# Patient Record
Sex: Female | Born: 1950
Health system: Southern US, Community
[De-identification: ages and names within clinical notes are randomized; demographics above are authoritative.]

## PROBLEM LIST (undated history)

## (undated) DIAGNOSIS — M1990 Unspecified osteoarthritis, unspecified site: Secondary | ICD-10-CM

## (undated) DIAGNOSIS — M25559 Pain in unspecified hip: Secondary | ICD-10-CM

## (undated) DIAGNOSIS — M5136 Other intervertebral disc degeneration, lumbar region: Secondary | ICD-10-CM

## (undated) DIAGNOSIS — E559 Vitamin D deficiency, unspecified: Secondary | ICD-10-CM

## (undated) DIAGNOSIS — R21 Rash and other nonspecific skin eruption: Secondary | ICD-10-CM

## (undated) DIAGNOSIS — M2550 Pain in unspecified joint: Secondary | ICD-10-CM

## (undated) DIAGNOSIS — R102 Pelvic and perineal pain: Secondary | ICD-10-CM

## (undated) DIAGNOSIS — K219 Gastro-esophageal reflux disease without esophagitis: Secondary | ICD-10-CM

## (undated) DIAGNOSIS — M51369 Other intervertebral disc degeneration, lumbar region without mention of lumbar back pain or lower extremity pain: Secondary | ICD-10-CM

## (undated) DIAGNOSIS — M810 Age-related osteoporosis without current pathological fracture: Secondary | ICD-10-CM

## (undated) DIAGNOSIS — R12 Heartburn: Secondary | ICD-10-CM

## (undated) DIAGNOSIS — N809 Endometriosis, unspecified: Secondary | ICD-10-CM

## (undated) DIAGNOSIS — R1310 Dysphagia, unspecified: Secondary | ICD-10-CM

## (undated) HISTORY — DX: Vitamin D deficiency, unspecified: E55.9

## (undated) HISTORY — DX: Pelvic and perineal pain: R10.2

## (undated) HISTORY — DX: Age-related osteoporosis without current pathological fracture: M81.0

## (undated) HISTORY — PX: SALPINGOOPHORECTOMY: SHX82

## (undated) HISTORY — DX: Unspecified osteoarthritis, unspecified site: M19.90

## (undated) HISTORY — DX: Pain in unspecified hip: M25.559

## (undated) HISTORY — DX: Endometriosis, unspecified: N80.9

---

## 1990-09-16 HISTORY — PX: ABDOMINAL HYSTERECTOMY: SHX81

## 2004-09-16 HISTORY — PX: COLONOSCOPY: SHX174

## 2004-09-24 ENCOUNTER — Ambulatory Visit: Payer: Self-pay | Admitting: Internal Medicine

## 2011-09-30 ENCOUNTER — Ambulatory Visit: Payer: Self-pay | Admitting: Internal Medicine

## 2012-09-30 ENCOUNTER — Ambulatory Visit: Payer: Self-pay | Admitting: Internal Medicine

## 2013-10-14 ENCOUNTER — Ambulatory Visit: Payer: Self-pay | Admitting: Internal Medicine

## 2013-12-06 ENCOUNTER — Ambulatory Visit: Payer: Self-pay | Admitting: Internal Medicine

## 2014-12-05 LAB — LIPID PANEL
Cholesterol: 192 mg/dL (ref 0–200)
HDL: 66 mg/dL (ref 35–70)
LDL Cholesterol: 110 mg/dL
Triglycerides: 80 mg/dL (ref 40–160)

## 2014-12-05 LAB — TSH: TSH: 1.89 u[IU]/mL (ref <=5.90)

## 2014-12-05 LAB — BASIC METABOLIC PANEL
BUN: 12 mg/dL (ref 4–21)
Creatinine: 0.7 mg/dL (ref <=1.1)

## 2014-12-05 LAB — CBC AND DIFFERENTIAL: Hemoglobin: 14.4 g/dL (ref 12.0–16.0)

## 2015-01-03 ENCOUNTER — Ambulatory Visit
Admit: 2015-01-03 | Disposition: A | Discharge status: Home / Self Care | Payer: Self-pay | Attending: Internal Medicine | Admitting: Internal Medicine

## 2015-01-03 LAB — HM DEXA SCAN

## 2015-01-03 LAB — HM MAMMOGRAPHY: HM Mammogram: NORMAL

## 2015-01-20 NOTE — Discharge Instructions (Signed)

## 2015-01-23 ENCOUNTER — Ambulatory Visit
Admission: RE | Admit: 2015-01-23 | Discharge: 2015-01-23 | Disposition: A | Discharge status: Home / Self Care | Payer: BLUE CROSS/BLUE SHIELD | Source: Ambulatory Visit | Attending: Gastroenterology | Admitting: Gastroenterology

## 2015-01-23 ENCOUNTER — Ambulatory Visit: Payer: BLUE CROSS/BLUE SHIELD | Admitting: Anesthesiology

## 2015-01-23 ENCOUNTER — Encounter
Admission: RE | Disposition: A | Discharge status: Home / Self Care | Payer: Self-pay | Source: Ambulatory Visit | Attending: Gastroenterology

## 2015-01-23 DIAGNOSIS — Z1211 Encounter for screening for malignant neoplasm of colon: Secondary | ICD-10-CM | POA: Diagnosis present

## 2015-01-23 DIAGNOSIS — K5730 Diverticulosis of large intestine without perforation or abscess without bleeding: Secondary | ICD-10-CM | POA: Diagnosis not present

## 2015-01-23 DIAGNOSIS — K648 Other hemorrhoids: Secondary | ICD-10-CM | POA: Insufficient documentation

## 2015-01-23 HISTORY — DX: Unspecified osteoarthritis, unspecified site: M19.90

## 2015-01-23 HISTORY — DX: Heartburn: R12

## 2015-01-23 HISTORY — DX: Dysphagia, unspecified: R13.10

## 2015-01-23 HISTORY — PX: COLONOSCOPY: SHX5424

## 2015-01-23 HISTORY — DX: Rash and other nonspecific skin eruption: R21

## 2015-01-23 HISTORY — DX: Pain in unspecified joint: M25.50

## 2015-01-23 LAB — HM COLONOSCOPY

## 2015-01-23 SURGERY — COLONOSCOPY
Anesthesia: Monitor Anesthesia Care | Wound class: Contaminated

## 2015-01-23 MED ORDER — DEXAMETHASONE SODIUM PHOSPHATE 4 MG/ML IJ SOLN
8.0000 mg | Freq: Once | INTRAMUSCULAR | Status: DC | PRN
Start: 2015-01-23 — End: 2015-01-23

## 2015-01-23 MED ORDER — ACETAMINOPHEN 160 MG/5ML PO SOLN: 325.0000 mg | ORAL | Status: DC | PRN

## 2015-01-23 MED ORDER — STERILE WATER FOR IRRIGATION IR SOLN
Status: DC | PRN
  Administered 2015-01-23: 08:00:00

## 2015-01-23 MED ORDER — ACETAMINOPHEN 325 MG PO TABS: 325.0000 mg | ORAL_TABLET | ORAL | Status: DC | PRN

## 2015-01-23 MED ORDER — LIDOCAINE HCL (CARDIAC) 20 MG/ML IV SOLN
INTRAVENOUS | Status: DC | PRN
  Administered 2015-01-23: 30 mg via INTRAVENOUS

## 2015-01-23 MED ORDER — OXYCODONE HCL 5 MG PO TABS: 5.0000 mg | ORAL_TABLET | Freq: Once | ORAL | Status: DC | PRN

## 2015-01-23 MED ORDER — LACTATED RINGERS IV SOLN
INTRAVENOUS | Status: DC | PRN
  Administered 2015-01-23: 08:00:00 via INTRAVENOUS

## 2015-01-23 MED ORDER — FENTANYL CITRATE (PF) 100 MCG/2ML IJ SOLN: 25.0000 ug | INTRAMUSCULAR | Status: DC | PRN

## 2015-01-23 MED ORDER — DEXAMETHASONE SODIUM PHOSPHATE 4 MG/ML IJ SOLN: 8.0000 mg | Freq: Once | INTRAMUSCULAR | Status: DC | PRN

## 2015-01-23 MED ORDER — LACTATED RINGERS IV SOLN: INTRAVENOUS | Status: DC

## 2015-01-23 MED ORDER — OXYCODONE HCL 5 MG/5ML PO SOLN: 5.0000 mg | Freq: Once | ORAL | Status: DC | PRN

## 2015-01-23 MED ORDER — SODIUM CHLORIDE 0.9 % IV SOLN: INTRAVENOUS | Status: DC

## 2015-01-23 MED ORDER — PROPOFOL 10 MG/ML IV BOLUS
INTRAVENOUS | Status: DC | PRN
  Administered 2015-01-23: 20 mg via INTRAVENOUS
  Administered 2015-01-23: 60 mg via INTRAVENOUS
  Administered 2015-01-23: 20 mg via INTRAVENOUS
  Administered 2015-01-23: 80 mg via INTRAVENOUS
  Administered 2015-01-23: 20 mg via INTRAVENOUS

## 2015-01-23 SURGICAL SUPPLY — 27 items
CANISTER SUCT 1200ML W/VALVE (MISCELLANEOUS) ×3 IMPLANT
FCP ESCP3.2XJMB 240X2.8X (MISCELLANEOUS)
FORCEPS BIOP RAD 4 LRG CAP 4 (CUTTING FORCEPS) IMPLANT
FORCEPS BIOP RJ4 240 W/NDL (MISCELLANEOUS)
FORCEPS ESCP3.2XJMB 240X2.8X (MISCELLANEOUS) IMPLANT
GOWN CVR UNV OPN BCK APRN NK (MISCELLANEOUS) ×2 IMPLANT
GOWN ISOL THUMB LOOP REG UNIV (MISCELLANEOUS) ×4
HEMOCLIP INSTINCT (CLIP) IMPLANT
INJECTOR VARIJECT VIN23 (MISCELLANEOUS) IMPLANT
KIT CO2 TUBING (TUBING) ×3 IMPLANT
KIT DEFENDO VALVE AND CONN (KITS) IMPLANT
KIT ENDO PROCEDURE OLY (KITS) ×3 IMPLANT
LIGATOR MULTIBAND 6SHOOTER MBL (MISCELLANEOUS) IMPLANT
MARKER SPOT ENDO TATTOO 5ML (MISCELLANEOUS) IMPLANT
PAD GROUND ADULT SPLIT (MISCELLANEOUS) IMPLANT
SNARE SHORT THROW 13M SML OVAL (MISCELLANEOUS) IMPLANT
SNARE SHORT THROW 30M LRG OVAL (MISCELLANEOUS) IMPLANT
SPOT EX ENDOSCOPIC TATTOO (MISCELLANEOUS)
SUCTION POLY TRAP 4CHAMBER (MISCELLANEOUS) IMPLANT
TRAP SUCTION POLY (MISCELLANEOUS) IMPLANT
TUBING CONN 6MMX3.1M (TUBING)
TUBING SUCTION CONN 0.25 STRL (TUBING) IMPLANT
UNDERPAD 30X60 958B10 (PK) (MISCELLANEOUS) IMPLANT
VALVE BIOPSY ENDO (VALVE) IMPLANT
VARIJECT INJECTOR VIN23 (MISCELLANEOUS)
WATER AUXILLARY (MISCELLANEOUS) IMPLANT
WATER STERILE IRR 500ML POUR (IV SOLUTION) ×3 IMPLANT

## 2015-01-23 NOTE — H&P (Signed)
  [facility]  [address] [place], [place] [address] Phone: [phone] Fax : [phone]  Primary Care Physician:  No primary care provider on file. Primary Gastroenterologist:  Dr. Allen Norris  Pre-Procedure History & Physical: HPI:  Valerie Raymond is a 64 y.o. female is here for an colonoscopy.   Past Medical History  Diagnosis Date  . Dysphagia     with solids and regurgitation  . Rash     skin rash and itching  . Heartburn   . Joint pain   . Arthritis     knee,hip,hands    Past Surgical History  Procedure Laterality Date  . Abdominal hysterectomy    . Salpingoophorectomy Bilateral   . Colonoscopy  2006    Prior to Admission medications   Medication Sig Start Date End Date Taking? Authorizing Provider  Cholecalciferol (VITAMIN D3) 1000 UNITS CAPS Take by mouth. am   Yes Historical Provider, MD  meloxicam (MOBIC) 15 MG tablet Take 15 mg by mouth daily.   Yes Historical Provider, MD  alendronate (FOSAMAX) 70 MG tablet Take 70 mg by mouth once a week. Take with a full glass of water on an empty stomach.    Historical Provider, MD    Allergies as of 01/10/2015  . (Not on File)    History reviewed. No pertinent family history.  History   Social History  . Marital Status: Single    Spouse Name: N/A  . Number of Children: N/A  . Years of Education: N/A   Occupational History  . Not on file.   Social History Main Topics  . Smoking status: Not on file  . Smokeless tobacco: Not on file  . Alcohol Use: Not on file  . Drug Use: Not on file  . Sexual Activity: Not on file   Other Topics Concern  . Not on file   Social History Narrative    Review of Systems: See HPI, otherwise negative ROS  Physical Exam: BP 146/97 mmHg  Pulse 92  Resp 18  Ht 5\' 6"  (1.676 m)  Wt 149 lb (67.586 kg)  BMI 24.06 kg/m2  SpO2 97% General:   Alert,  pleasant and cooperative in NAD Head:  Normocephalic and atraumatic. Neck:  Supple; no masses or  thyromegaly. Lungs:  Clear throughout to auscultation.    Heart:  Regular rate and rhythm. Abdomen:  Soft, nontender and nondistended. Normal bowel sounds, without guarding, and without rebound.   Neurologic:  Alert and  oriented x4;  grossly normal neurologically.  Impression/Plan: Valerie Raymond is here for an colonoscopy to be performed for Screening  Risks, benefits, limitations, and alternatives regarding  colonoscopy have been reviewed with the patient.  Questions have been answered.  All parties agreeable.   [facility], MD  01/23/2015, 7:37 AM

## 2015-01-23 NOTE — Op Note (Signed)
[facility] Patient Name: Valerie Raymond Procedure Date: 01/23/2015 8:09 AM MRN: [id number] Account #: [id number] Date of Birth: Jan 26, 1951 Admit Type: Outpatient Age: 64 Room: [facility] OR ROOM 01 Gender: Female Note Status: Finalized Procedure:         Colonoscopy Indications:       Screening for colorectal malignant neoplasm Providers:         Lucilla Lame, MD Referring MD:      Halina Maidens, MD (Referring MD) Medicines:         Propofol per Anesthesia Complications:     No immediate complications. Procedure:         Pre-Anesthesia Assessment:                    - Prior to the procedure, a History and Physical was                     performed, and patient medications and allergies were                     reviewed. The patient's tolerance of previous anesthesia                     was also reviewed. The risks and benefits of the procedure                     and the sedation options and risks were discussed with the                     patient. All questions were answered, and informed consent                     was obtained. Prior Anticoagulants: The patient has taken                     no previous anticoagulant or antiplatelet agents. ASA                     Grade Assessment: II - A patient with mild systemic                     disease. After reviewing the risks and benefits, the                     patient was deemed in satisfactory condition to undergo                     the procedure.                    After obtaining informed consent, the colonoscope was                     passed under direct vision. Throughout the procedure, the                     patient's blood pressure, pulse, and oxygen saturations                     were monitored continuously. The Olympus CF H180AL                     colonoscope (S#: [id number]) was introduced through the anus  and advanced to the the cecum, identified by appendiceal         orifice and ileocecal valve. The colonoscopy was performed                     without difficulty. The patient tolerated the procedure                     well. The quality of the bowel preparation was excellent. Findings:      The perianal and digital rectal examinations were normal.      Multiple small-mouthed diverticula were found in the sigmoid colon.      Non-bleeding internal hemorrhoids were found during retroflexion. The       hemorrhoids were Grade II (internal hemorrhoids that prolapse but reduce       spontaneously). Impression:        - Diverticulosis in the sigmoid colon.                    - Non-bleeding internal hemorrhoids.                    - No specimens collected. Recommendation:    - Repeat colonoscopy in 10 years for screening unless any                     change in family history or lower GI problems. Procedure Code(s): --- Professional ---                    [phone], Colonoscopy, flexible; diagnostic, including                     collection of specimen(s) by brushing or washing, when                     performed (separate procedure) Diagnosis Code(s): --- Professional ---                    Z12.11, Encounter for screening for malignant neoplasm of                     colon                    K64.1, Second degree hemorrhoids CPT copyright 2014 American Medical Association. All rights reserved. The codes documented in this report are preliminary and upon coder review may  be revised to meet current compliance requirements. Lucilla Lame, MD 01/23/2015 8:28:26 AM This report has been signed electronically. Number of Addenda: 0 Note Initiated On: 01/23/2015 8:09 AM Scope Withdrawal Time: 0 hours 7 minutes 13 seconds  Total Procedure Duration: 0 hours 10 minutes 50 seconds       [facility]

## 2015-01-23 NOTE — Anesthesia Postprocedure Evaluation (Signed)
  Anesthesia Post-op Note  Patient: Valerie Raymond  Procedure(s) Performed: Procedure(s) with comments: COLONOSCOPY (N/A) - cecum- 1856  Anesthesia type:MAC  Patient location: PACU  Post pain: Pain level controlled  Post assessment: Post-op Vital signs reviewed, Patient's Cardiovascular Status Stable, Respiratory Function Stable, Patent Airway and No signs of Nausea or vomiting  Post vital signs: Reviewed and stable  Last Vitals:  Filed Vitals:   01/23/15 0833  BP: 89/50  Pulse: 63  Temp: 36.1 C  Resp: 16    Level of consciousness: awake, alert  and patient cooperative  Complications: No apparent anesthesia complications

## 2015-01-23 NOTE — Anesthesia Procedure Notes (Signed)
Procedure Name: MAC Performed by: Franklyn Cafaro Pre-anesthesia Checklist: Patient identified, Emergency Drugs available, Suction available, Patient being monitored and Timeout performed Patient Re-evaluated:Patient Re-evaluated prior to inductionOxygen Delivery Method: Nasal cannula       

## 2015-01-23 NOTE — Anesthesia Preprocedure Evaluation (Addendum)
Anesthesia Evaluation  Patient identified by MRN, date of birth, ID band Patient awake    Reviewed: Allergy & Precautions, H&P , NPO status , Patient's Chart, lab work & pertinent test results, reviewed documented beta blocker date and time   History of Anesthesia Complications Negative for: history of anesthetic complications  Airway Mallampati: I  TM Distance: >3 FB Neck ROM: full    Dental no notable dental hx.    Pulmonary neg pulmonary ROS,  breath sounds clear to auscultation  Pulmonary exam normal       Cardiovascular Exercise Tolerance: Good negative cardio ROS Normal cardiovascular examRhythm:regular Rate:Normal     Neuro/Psych negative neurological ROS  negative psych ROS   GI/Hepatic negative GI ROS, Neg liver ROS, GERD-  Medicated,  Endo/Other  negative endocrine ROS  Renal/GU negative Renal ROS  negative genitourinary   Musculoskeletal   Abdominal   Peds  Hematology negative hematology ROS (+)   Anesthesia Other Findings   Reproductive/Obstetrics negative OB ROS                           Anesthesia Physical Anesthesia Plan  ASA: II  Anesthesia Plan: MAC   Post-op Pain Management:    Induction:   Airway Management Planned:   Additional Equipment:   Intra-op Plan:   Post-operative Plan:   Informed Consent: I have reviewed the patients History and Physical, chart, labs and discussed the procedure including the risks, benefits and alternatives for the proposed anesthesia with the patient or authorized representative who has indicated his/her understanding and acceptance.     Plan Discussed with: CRNA  Anesthesia Plan Comments:        Anesthesia Quick Evaluation

## 2015-01-23 NOTE — Transfer of Care (Signed)
Immediate Anesthesia Transfer of Care Note  Patient: Valerie Raymond  Procedure(s) Performed: Procedure(s) with comments: COLONOSCOPY (N/A) - cecum- 7616  Patient Location: PACU  Anesthesia Type: MAC  Level of Consciousness: awake, alert  and patient cooperative  Airway and Oxygen Therapy: Patient Spontanous Breathing and Patient connected to supplemental oxygen  Post-op Assessment: Post-op Vital signs reviewed, Patient's Cardiovascular Status Stable, Respiratory Function Stable, Patent Airway and No signs of Nausea or vomiting  Post-op Vital Signs: Reviewed and stable  Complications: No apparent anesthesia complications

## 2015-01-25 ENCOUNTER — Encounter: Payer: Self-pay | Admitting: Gastroenterology

## 2015-07-23 ENCOUNTER — Encounter: Payer: Self-pay | Admitting: Internal Medicine

## 2015-07-23 DIAGNOSIS — G8929 Other chronic pain: Secondary | ICD-10-CM | POA: Insufficient documentation

## 2015-07-23 DIAGNOSIS — M818 Other osteoporosis without current pathological fracture: Secondary | ICD-10-CM | POA: Insufficient documentation

## 2015-07-23 DIAGNOSIS — E559 Vitamin D deficiency, unspecified: Secondary | ICD-10-CM | POA: Insufficient documentation

## 2015-07-23 DIAGNOSIS — M1710 Unilateral primary osteoarthritis, unspecified knee: Secondary | ICD-10-CM | POA: Insufficient documentation

## 2015-07-23 DIAGNOSIS — M25559 Pain in unspecified hip: Secondary | ICD-10-CM | POA: Insufficient documentation

## 2015-07-23 DIAGNOSIS — K5730 Diverticulosis of large intestine without perforation or abscess without bleeding: Secondary | ICD-10-CM | POA: Insufficient documentation

## 2015-07-23 DIAGNOSIS — M179 Osteoarthritis of knee, unspecified: Secondary | ICD-10-CM | POA: Insufficient documentation

## 2015-07-23 HISTORY — DX: Pain in unspecified hip: M25.559

## 2015-12-09 ENCOUNTER — Ambulatory Visit
Admission: EM | Admit: 2015-12-09 | Discharge: 2015-12-09 | Disposition: A | Discharge status: Home / Self Care | Payer: BLUE CROSS/BLUE SHIELD | Attending: Family Medicine | Admitting: Family Medicine

## 2015-12-09 ENCOUNTER — Encounter: Payer: Self-pay | Admitting: *Deleted

## 2015-12-09 DIAGNOSIS — N390 Urinary tract infection, site not specified: Secondary | ICD-10-CM | POA: Diagnosis not present

## 2015-12-09 LAB — URINALYSIS COMPLETE WITH MICROSCOPIC (ARMC ONLY)
Bilirubin Urine: NEGATIVE
Glucose, UA: NEGATIVE mg/dL
Ketones, ur: NEGATIVE mg/dL
Nitrite: NEGATIVE
Protein, ur: NEGATIVE mg/dL
Specific Gravity, Urine: <1.005 — ABNORMAL LOW (ref 1.005–1.030)
pH: 5 (ref 5.0–8.0)

## 2015-12-09 MED ORDER — CIPROFLOXACIN HCL 500 MG PO TABS: 500.0000 mg | ORAL_TABLET | Freq: Two times a day (BID) | ORAL | Status: DC

## 2015-12-09 NOTE — ED Notes (Signed)
Dysuria, frequency, urgency since Tuesday, has progressed to back pain and fever.

## 2015-12-09 NOTE — ED Provider Notes (Signed)
CSN: [id number]     Arrival date & time 12/09/15  0818 History   First MD Initiated Contact with Patient 12/09/15 0901     Chief Complaint  Patient presents with  . Dysuria  . Urinary Frequency  . Urinary Urgency  . Back Pain  . Fever   (Consider location/radiation/quality/duration/timing/severity/associated sxs/prior Treatment) HPI  65 year old female presents to urgent care with the above complaints.  Patient states that she's not felt well since Tuesday. She been experiencing dysuria, urinary frequency and urgency. She's also had some associated back pain. She reports chills but is unclear if she's had a fever. She has not taken her temperature. No known exacerbating or relieving factors. No other complaints at this time.  Past Medical History  Diagnosis Date  . Dysphagia     with solids and regurgitation  . Rash     skin rash and itching  . Heartburn   . Joint pain   . Arthritis     knee,hip,hands   Past Surgical History  Procedure Laterality Date  . Abdominal hysterectomy    . Salpingoophorectomy Bilateral   . Colonoscopy  2006  . Colonoscopy N/A 01/23/2015    Procedure: COLONOSCOPY;  Surgeon: Lucilla Lame, MD;  Location: [facility];  Service: Gastroenterology;  Laterality: N/A;  [id number]   Family History  Problem Relation Age of Onset  . Pancreatic cancer Mother   . Breast cancer Sister 79  . Prostate cancer Father    Social History  Substance Use Topics  . Smoking status: Never Smoker   . Smokeless tobacco: None  . Alcohol Use: No   OB History    No data available     Review of Systems  Constitutional: Positive for chills. Negative for fever.  Genitourinary: Positive for dysuria, urgency and frequency.  Musculoskeletal: Positive for back pain.  All other systems reviewed and are negative.   Allergies  Lodine  Home Medications   Prior to Admission medications   Medication Sig Start Date End Date Taking? Authorizing Provider   acetaminophen (TYLENOL) 500 MG tablet Take 1,000 mg by mouth every 6 (six) hours as needed.   Yes Historical Provider, MD  alendronate (FOSAMAX) 70 MG tablet Take 70 mg by mouth once a week. Take with a full glass of water on an empty stomach.   Yes Historical Provider, MD  Cholecalciferol (VITAMIN D3) 1000 UNITS CAPS Take by mouth. am   Yes Historical Provider, MD  meloxicam (MOBIC) 15 MG tablet Take 15 mg by mouth daily.   Yes Historical Provider, MD  ciprofloxacin (CIPRO) 500 MG tablet Take 1 tablet (500 mg total) by mouth every 12 (twelve) hours. 12/09/15   Coral Spikes, DO   Meds Ordered and Administered this Visit  Medications - No data to display  BP 143/83 mmHg  Pulse 78  Temp(Src) 98 F (36.7 C) (Oral)  Resp 16  Ht 5\' 6"  (1.676 m)  Wt 153 lb (69.4 kg)  BMI 24.71 kg/m2  SpO2 98% No data found.  Physical Exam  Constitutional: She is oriented to person, place, and time. She appears well-developed. No distress.  HENT:  Mouth/Throat: Oropharynx is clear and moist.  Eyes: Conjunctivae are normal. No scleral icterus.  Neck: Neck supple.  Cardiovascular: Normal rate and regular rhythm.   Soft 2/6 systolic murmur.  Pulmonary/Chest: Effort normal and breath sounds normal. She has no wheezes. She has no rales.  Abdominal: Soft.  Mild suprapubic tenderness. No rebound or guarding.  Musculoskeletal: Normal  range of motion. She exhibits no edema.  Neurological: She is alert and oriented to person, place, and time.  Skin: Skin is warm and dry.  Psychiatric: She has a normal mood and affect.  Vitals reviewed.  ED Course  Procedures (including critical care time)  Labs Review Labs Reviewed  URINALYSIS COMPLETEWITH MICROSCOPIC ([facility] ONLY) - Abnormal; Notable for the following:    Specific Gravity, Urine <1.005 (*)    Hgb urine dipstick 2+ (*)    Leukocytes, UA 2+ (*)    Bacteria, UA FEW (*)    Squamous Epithelial / LPF 0-5 (*)    All other components within normal limits   URINE CULTURE   Imaging Review No results found.  MDM   1. UTI (lower urinary tract infection)    65 year old female presents with classic symptoms for UTI. Urinalysis remarkable for 2+ leukocytes and 2+ blood. Also revealed too numerous to count white blood cells. Given reported systemic symptoms, treating with Cipro.    Coral Spikes, DO 12/09/15 [phone]

## 2015-12-09 NOTE — Discharge Instructions (Signed)
Take the anabolic as prescribed. Follow-up as needed.   Take care  Dr. Lacinda Axon   Urinary Tract Infection Urinary tract infections (UTIs) can develop anywhere along your urinary tract. Your urinary tract is your body's drainage system for removing wastes and extra water. Your urinary tract includes two kidneys, two ureters, a bladder, and a urethra. Your kidneys are a pair of bean-shaped organs. Each kidney is about the size of your fist. They are located below your ribs, one on each side of your spine. CAUSES Infections are caused by microbes, which are microscopic organisms, including fungi, viruses, and bacteria. These organisms are so small that they can only be seen through a microscope. Bacteria are the microbes that most commonly cause UTIs. SYMPTOMS  Symptoms of UTIs may vary by age and gender of the patient and by the location of the infection. Symptoms in young women typically include a frequent and intense urge to urinate and a painful, burning feeling in the bladder or urethra during urination. Older women and men are more likely to be tired, shaky, and weak and have muscle aches and abdominal pain. A fever may mean the infection is in your kidneys. Other symptoms of a kidney infection include pain in your back or sides below the ribs, nausea, and vomiting. DIAGNOSIS To diagnose a UTI, your caregiver will ask you about your symptoms. Your caregiver will also ask you to provide a urine sample. The urine sample will be tested for bacteria and white blood cells. White blood cells are made by your body to help fight infection. TREATMENT  Typically, UTIs can be treated with medication. Because most UTIs are caused by a bacterial infection, they usually can be treated with the use of antibiotics. The choice of antibiotic and length of treatment depend on your symptoms and the type of bacteria causing your infection. HOME CARE INSTRUCTIONS  If you were prescribed antibiotics, take them exactly as  your caregiver instructs you. Finish the medication even if you feel better after you have only taken some of the medication.  Drink enough water and fluids to keep your urine clear or pale yellow.  Avoid caffeine, tea, and carbonated beverages. They tend to irritate your bladder.  Empty your bladder often. Avoid holding urine for long periods of time.  Empty your bladder before and after sexual intercourse.  After a bowel movement, women should cleanse from front to back. Use each tissue only once. SEEK MEDICAL CARE IF:   You have back pain.  You develop a fever.  Your symptoms do not begin to resolve within 3 days. SEEK IMMEDIATE MEDICAL CARE IF:   You have severe back pain or lower abdominal pain.  You develop chills.  You have nausea or vomiting.  You have continued burning or discomfort with urination. MAKE SURE YOU:   Understand these instructions.  Will watch your condition.  Will get help right away if you are not doing well or get worse.   This information is not intended to replace advice given to you by your health care provider. Make sure you discuss any questions you have with your health care provider.   Document Released: 06/12/2005 Document Revised: 05/24/2015 Document Reviewed: 10/11/2011 Elsevier Interactive Patient Education [employer].

## 2015-12-11 ENCOUNTER — Ambulatory Visit: Payer: Self-pay | Admitting: Internal Medicine

## 2015-12-11 LAB — URINE CULTURE
Culture: 30000
Special Requests: NORMAL

## 2015-12-15 ENCOUNTER — Encounter: Payer: Self-pay | Admitting: Internal Medicine

## 2015-12-15 ENCOUNTER — Ambulatory Visit (INDEPENDENT_AMBULATORY_CARE_PROVIDER_SITE_OTHER): Payer: BLUE CROSS/BLUE SHIELD | Admitting: Internal Medicine

## 2015-12-15 VITALS — BP 132/84 | HR 96 | Ht 66.0 in | Wt 156.0 lb

## 2015-12-15 DIAGNOSIS — Z1239 Encounter for other screening for malignant neoplasm of breast: Secondary | ICD-10-CM

## 2015-12-15 DIAGNOSIS — N8110 Cystocele, unspecified: Secondary | ICD-10-CM

## 2015-12-15 DIAGNOSIS — IMO0002 Reserved for concepts with insufficient information to code with codable children: Secondary | ICD-10-CM

## 2015-12-15 NOTE — Progress Notes (Signed)
Date:  12/15/2015   Name:  Valerie Raymond   DOB:  Mar 05, 1951   MRN:  [id number]  Patient was seen in UC 6 days ago - treated with Cipro for E Coli 30,000 units on culture.  Chief Complaint: Bladder Prolapse HPI Two days ago she was on her feet all day at work.  She does not recall any heavy lifting.  Later that day she felt a mass at her vagina when she was in the bathroom.  There is no pain or bleeding.   Review of Systems  Constitutional: Negative for fever and chills.  Respiratory: Negative for cough, chest tightness and shortness of breath.   Cardiovascular: Negative for chest pain and palpitations.  Gastrointestinal: Negative for abdominal pain and constipation.  Genitourinary: Positive for difficulty urinating. Negative for dysuria, flank pain, vaginal bleeding, vaginal pain and pelvic pain.    Patient Active Problem List   Diagnosis Date Noted  . Colon, diverticulosis 07/23/2015  . Arthralgia of hip 07/23/2015  . Arthritis of knee, degenerative 07/23/2015  . OP (osteoporosis) 07/23/2015  . Avitaminosis D 07/23/2015    Prior to Admission medications   Medication Sig Start Date End Date Taking? Authorizing Provider  acetaminophen (TYLENOL) 500 MG tablet Take 1,000 mg by mouth every 6 (six) hours as needed.    Historical Provider, MD  alendronate (FOSAMAX) 70 MG tablet Take 70 mg by mouth once a week. Take with a full glass of water on an empty stomach.    Historical Provider, MD  Cholecalciferol (VITAMIN D3) 1000 UNITS CAPS Take by mouth. am    Historical Provider, MD  ciprofloxacin (CIPRO) 500 MG tablet Take 1 tablet (500 mg total) by mouth every 12 (twelve) hours. 12/09/15   Coral Spikes, DO  meloxicam (MOBIC) 15 MG tablet Take 15 mg by mouth daily.    Historical Provider, MD    Allergies  Allergen Reactions  . Lodine [Etodolac] Diarrhea and Nausea And Vomiting    Past Surgical History  Procedure Laterality Date  . Abdominal hysterectomy    . Salpingoophorectomy  Bilateral   . Colonoscopy  2006  . Colonoscopy N/A 01/23/2015    Procedure: COLONOSCOPY;  Surgeon: Lucilla Lame, MD;  Location: [place];  Service: Gastroenterology;  Laterality: N/A;  [id number]    Social History  Substance Use Topics  . Smoking status: Never Smoker   . Smokeless tobacco: None  . Alcohol Use: No     Medication list has been reviewed and updated.   Physical Exam  Constitutional: She is oriented to person, place, and time. She appears well-developed. No distress.  HENT:  Head: Normocephalic and atraumatic.  Pulmonary/Chest: Effort normal. No respiratory distress.  Abdominal: Soft. Normal appearance and bowel sounds are normal. There is no tenderness.  Genitourinary: Vagina normal. There is no tenderness, lesion or injury on the right labia. There is no tenderness, lesion or injury on the left labia. No erythema or tenderness in the vagina.  Small cystocele present with minimal protrusion.  Bimanual exam - no mass or tenderness, no bleeding  Musculoskeletal: Normal range of motion.  Neurological: She is alert and oriented to person, place, and time.  Skin: Skin is warm and dry. No rash noted.  Psychiatric: She has a normal mood and affect. Her behavior is normal. Thought content normal.    BP 132/84 mmHg  Pulse 96  Ht 5\' 6"  (1.676 m)  Wt 156 lb (70.761 kg)  BMI 25.19 kg/m2  Assessment and  Plan: 1. Cystocele Precautions given - Ambulatory referral to Gynecology  2. Breast cancer screening - MM DIGITAL SCREENING BILATERAL; Future   Halina Maidens, MD [facility]  12/15/2015

## 2016-01-02 ENCOUNTER — Encounter: Payer: Self-pay | Admitting: Obstetrics and Gynecology

## 2016-01-02 ENCOUNTER — Ambulatory Visit (INDEPENDENT_AMBULATORY_CARE_PROVIDER_SITE_OTHER): Payer: BLUE CROSS/BLUE SHIELD | Admitting: Obstetrics and Gynecology

## 2016-01-02 VITALS — BP 162/102 | HR 106 | Ht 66.0 in | Wt 157.1 lb

## 2016-01-02 DIAGNOSIS — Z803 Family history of malignant neoplasm of breast: Secondary | ICD-10-CM | POA: Diagnosis not present

## 2016-01-02 DIAGNOSIS — R339 Retention of urine, unspecified: Secondary | ICD-10-CM

## 2016-01-02 DIAGNOSIS — IMO0002 Reserved for concepts with insufficient information to code with codable children: Secondary | ICD-10-CM

## 2016-01-02 DIAGNOSIS — N952 Postmenopausal atrophic vaginitis: Secondary | ICD-10-CM

## 2016-01-02 DIAGNOSIS — Z90722 Acquired absence of ovaries, bilateral: Secondary | ICD-10-CM

## 2016-01-02 DIAGNOSIS — N8110 Cystocele, unspecified: Secondary | ICD-10-CM

## 2016-01-02 DIAGNOSIS — M810 Age-related osteoporosis without current pathological fracture: Secondary | ICD-10-CM | POA: Diagnosis not present

## 2016-01-02 DIAGNOSIS — N8111 Cystocele, midline: Secondary | ICD-10-CM | POA: Insufficient documentation

## 2016-01-02 DIAGNOSIS — Z90710 Acquired absence of both cervix and uterus: Secondary | ICD-10-CM | POA: Insufficient documentation

## 2016-01-02 DIAGNOSIS — R102 Pelvic and perineal pain: Secondary | ICD-10-CM | POA: Insufficient documentation

## 2016-01-02 DIAGNOSIS — Z9079 Acquired absence of other genital organ(s): Secondary | ICD-10-CM

## 2016-01-02 DIAGNOSIS — N9489 Other specified conditions associated with female genital organs and menstrual cycle: Secondary | ICD-10-CM

## 2016-01-02 HISTORY — DX: Pelvic and perineal pain: R10.2

## 2016-01-02 LAB — POCT URINALYSIS DIPSTICK
Bilirubin, UA: NEGATIVE
Blood, UA: NEGATIVE
Glucose, UA: NEGATIVE
Ketones, UA: NEGATIVE
Leukocytes, UA: NEGATIVE
Nitrite, UA: POSITIVE
Protein, UA: NEGATIVE
Spec Grav, UA: <=1.005
Urobilinogen, UA: 0.2
pH, UA: 6

## 2016-01-02 MED ORDER — ESTROGENS, CONJUGATED 0.625 MG/GM VA CREA: 0.2500 | TOPICAL_CREAM | VAGINAL | Status: DC

## 2016-01-02 MED ORDER — NITROFURANTOIN MONOHYD MACRO 100 MG PO CAPS: 100.0000 mg | ORAL_CAPSULE | Freq: Two times a day (BID) | ORAL | Status: DC

## 2016-01-02 NOTE — Patient Instructions (Addendum)
1. Estrogen cream 1/2 g intravaginal twice a week 2. Macrobid twice a day for 7 days. She 3. Return in 2 weeks for pessary trial  About Cystocele  Overview  The pelvic organs, including the bladder, are normally supported by pelvic floor muscles and ligaments.  When these muscles and ligaments are stretched, weakened or torn, the wall between the bladder and the vagina sags or herniates causing a prolapse, sometimes called a cystocele.  This condition may cause discomfort and problems with emptying the bladder.  It can be present in various stages.  Some people are not aware of the changes.  Others may notice changes at the vaginal opening or a feeling of the bladder dropping outside the body.  Causes of a Cystocele  A cystocele is usually caused by muscle straining or stretching during childbirth.  In addition, cystocele is more common after menopause, because the hormone estrogen helps keep the elastic tissues around the pelvic organs strong.  A cystocele is more likely to occur when levels of estrogen decrease.  Other causes include: heavy lifting, chronic coughing, previous pelvic surgery and obesity.  Symptoms  A bladder that has dropped from its normal position may cause: unwanted urine leakage (stress incontinence), frequent urination or urge to urinate, incomplete emptying of the bladder (not feeling bladder relief after emptying), pain or discomfort in the vagina, pelvis, groin, lower back or lower abdomen and frequent urinary tract infections.  Mild cases may not cause any symptoms.  Treatment Options  Pelvic floor (Kegel) exercises:  Strength training the muscles in your genital area  Behavioral changes: Treating and preventing constipation, taking time to empty your bladder properly, learning to lift properly and/or avoid heavy lifting when possible, stopping smoking, avoiding weight gain and treating a chronic cough or bronchitis.  A pessary: A vaginal support device is  sometimes used to help pelvic support caused by muscle and ligament changes.  Surgery: Surgical repair may be necessary if symptoms cannot be managed with exercise, behavioral changes and a pessary.  Surgery is usually considered for severe cases.   2007, [employer]

## 2016-01-02 NOTE — Addendum Note (Signed)
Addended by: Elouise Munroe on: 01/02/2016 10:58 AM   Modules accepted: Orders

## 2016-01-02 NOTE — Progress Notes (Signed)
GYN ENCOUNTER NOTE  Subjective:       Valerie Raymond is a 65 y.o. G0P0 female is here for gynecologic evaluation of the following issues:  1.Pelvic organ prolapse.    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 Patient does report low backache as well as pelvic pressure. She is status post TAH/BSO for endometriosis at age 57.  GU symptoms: Frequency-15 per day Nocturia-2 times per minute Urgency: Present Stress urinary incontinence:  Previously noted stress incontinence, not as bad present time with exacerbation of prolapse Incomplete bladder emptying: Present Recent history of recurrent UTIs-2 this year Patient reports history of pelvic pressure and low back  GI symptoms: Bowel movements-daily No need for use of stool softeners or cathartics No history of splinting.  Patient is not sexually active   Gynecologic History No LMP recorded. Patient has had a hysterectomy. Contraception: status post hysterectomy Last Pap: Normal Last mammogram: Normal  Menarche: Age 21 Status post TAH/BSO at age 57 for endometriosis History of ERT use for 14 years Still with occasional vasomotor symptoms No history of abnormal Pap smears. No history of abnormal mammograms Not sexually active Family history of breast cancer in 2 sisters; one sister diagnosed at age 16; no BRCA testing history  Obstetric History OB History  Gravida Para Term Preterm AB SAB TAB Ectopic Multiple Living  0                 Past Medical History  Diagnosis Date  . Dysphagia     with solids and regurgitation  . Rash     skin rash and itching  . Heartburn   . Joint pain   . Arthritis     knee,hip,hands  . Endometriosis     h/o  . Osteoporosis     hip  . Vitamin D deficiency     Past Surgical History  Procedure Laterality Date  . Colonoscopy  2006  . Colonoscopy N/A 01/23/2015    Procedure: COLONOSCOPY;  Surgeon:  Lucilla Lame, MD;  Location: [facility];  Service: Gastroenterology;  Laterality: N/A;  cecum- [id number]  . Abdominal hysterectomy  1992  . Salpingoophorectomy Bilateral     Current Outpatient Prescriptions on File Prior to Visit  Medication Sig Dispense Refill  . alendronate (FOSAMAX) 70 MG tablet Take 70 mg by mouth once a week. Take with a full glass of water on an empty stomach.    . Cholecalciferol (VITAMIN D3) 1000 UNITS CAPS Take by mouth. am     No current facility-administered medications on file prior to visit.    Allergies  Allergen Reactions  . Lodine [Etodolac] Diarrhea and Nausea And Vomiting    Social History   Social History  . Marital Status: Single    Spouse Name: N/A  . Number of Children: N/A  . Years of Education: N/A   Occupational History  . Not on file.   Social History Main Topics  . Smoking status: Never Smoker   . Smokeless tobacco: Not on file  . Alcohol Use: No  . Drug Use: No  . Sexual Activity: Not Currently    Birth Control/ Protection: Surgical   Other Topics Concern  . Not on file   Social History Narrative    Family History  Problem Relation Age of Onset  . Pancreatic cancer Mother   . Breast cancer Sister 11  . Prostate cancer Father   . Colon  cancer Paternal Uncle   . Ovarian cancer Neg Hx   . Diabetes Neg Hx     The following portions of the patient's history were reviewed and updated as appropriate: allergies, current medications, past family history, past medical history, past social history, past surgical history and problem list.  Review of Systems Review of Systems - General ROS: negative for - chills, fatigue, fever, hot flashes, malaise. POSITIVE-vasomotor symptoms mild Hematological and Lymphatic ROS: negative for - bleeding problems or swollen lymph nodes Gastrointestinal ROS: negative for - abdominal pain, blood in stools, change in bowel habits and nausea/vomiting Musculoskeletal ROS: negative for - joint  pain, muscle pain or muscular weakness. POSITIVE-back pain Genito-Urinary ROS: negative for - change in menstrual cycle, dysmenorrhea, dyspareunia, dysuria, genital discharge, genital ulcers, hematuria, irregular/heavy menses, nocturia or pelvic pain. POSITIVE-urgency, nocturia, frequency, incontinence  Objective:   BP 162/102 mmHg  Pulse 106  Ht '5\' 6"'$  (1.676 m)  Wt 157 lb 1.6 oz (71.26 kg)  BMI 25.37 kg/m2 CONSTITUTIONAL: Well-developed, well-nourished female in no acute distress.  HENT:  Normocephalic, atraumatic.  NECK: Normal range of motion, supple, no masses.  Normal thyroid.  SKIN: Skin is warm and dry. No rash noted. Not diaphoretic. No erythema. No pallor. [place]: Alert and oriented to person, place, and time. PSYCHIATRIC: Normal mood and affect. Normal behavior. Normal judgment and thought content. CARDIOVASCULAR:Not Examined RESPIRATORY: Not Examined BREASTS: Not Examined ABDOMEN: Soft, non distended; Non tender.  No Organomegaly. PELVIC:  External Genitalia: Normal  BUS: Normal  Vagina: Mild to moderate atrophy; first to second-degree cystocele; no rectocele; no enterocele  Cervix: Surgically absent  Uterus: Surgically absent  Adnexa: Normal  RV: Normal external exam; normal sphincter tone  Bladder: Nontender MUSCULOSKELETAL: Normal range of motion. No tenderness.  No cyanosis, clubbing, or edema.     Assessment:   1. Pelvic pressure in female - Urine culture - POCT urinalysis dipstick 2. UTI 3. Cystocele, first to second-degree 4. Incomplete bladder emptying 5. Vaginal atrophy 6. Status post TAH/BSO for endometriosis 7. Family history of breast cancer-2 sisters; one sister diagnosed at age 19 8. Osteoporosis; on Fosamax for 1 year currently (previously on Fosamax 1 year per Dr. Elisabeth Pigeon)     Plan:   1. Begin estrogen cream therapy twice weekly in vagina 2. Macrobid twice a day for 7 days for UTI 3. Urine culture sent 4. Return in 2 weeks for  pessary trial 5. BRCA testing for strong family history of breast cancer reviewed 6. Osteoporosis management discussed and reviewed; patient is to consider Evista therapy in  future due to beneficial effects of providing osteoporosis prevention as well as lowering risk for breast cancer in high risk individuals  Brayton Mars, MD  Note: This dictation was prepared with Dragon dictation along with smaller phrase technology. Any transcriptional errors that result from this process are unintentional.

## 2016-01-03 LAB — URINE CULTURE: Organism ID, Bacteria: NO GROWTH

## 2016-01-03 LAB — PLEASE NOTE

## 2016-01-06 ENCOUNTER — Other Ambulatory Visit: Payer: Self-pay | Admitting: Internal Medicine

## 2016-01-08 ENCOUNTER — Ambulatory Visit
Admission: RE | Admit: 2016-01-08 | Discharge: 2016-01-08 | Disposition: A | Discharge status: Home / Self Care | Payer: BLUE CROSS/BLUE SHIELD | Source: Ambulatory Visit | Attending: Internal Medicine | Admitting: Internal Medicine

## 2016-01-08 DIAGNOSIS — Z1231 Encounter for screening mammogram for malignant neoplasm of breast: Secondary | ICD-10-CM | POA: Diagnosis present

## 2016-01-08 DIAGNOSIS — Z1239 Encounter for other screening for malignant neoplasm of breast: Secondary | ICD-10-CM

## 2016-01-18 ENCOUNTER — Encounter: Payer: Self-pay | Admitting: Obstetrics and Gynecology

## 2016-01-18 ENCOUNTER — Ambulatory Visit (INDEPENDENT_AMBULATORY_CARE_PROVIDER_SITE_OTHER): Payer: BLUE CROSS/BLUE SHIELD | Admitting: Obstetrics and Gynecology

## 2016-01-18 VITALS — BP 150/88 | HR 103 | Ht 66.0 in | Wt 156.6 lb

## 2016-01-18 DIAGNOSIS — IMO0002 Reserved for concepts with insufficient information to code with codable children: Secondary | ICD-10-CM

## 2016-01-18 DIAGNOSIS — N8110 Cystocele, unspecified: Secondary | ICD-10-CM

## 2016-01-18 DIAGNOSIS — N9489 Other specified conditions associated with female genital organs and menstrual cycle: Secondary | ICD-10-CM

## 2016-01-18 DIAGNOSIS — R339 Retention of urine, unspecified: Secondary | ICD-10-CM

## 2016-01-18 DIAGNOSIS — R102 Pelvic and perineal pain: Secondary | ICD-10-CM

## 2016-01-18 DIAGNOSIS — N952 Postmenopausal atrophic vaginitis: Secondary | ICD-10-CM | POA: Diagnosis not present

## 2016-01-18 NOTE — Progress Notes (Signed)
Chief complaint: 1. Pelvic organ prolapse 2. Pessary fitting  Patient has first to second-degree cystocele. She is status post TAH/BSO. She has started using estrogen cream twice a week to help with vaginal atrophy. Pelvic pressure and incomplete bladder emptying are chief complaints.  Pessary fitting is done today.  OBJECTIVE: BP 150/88 mmHg  Pulse 103  Ht 5\' 6"  (1.676 m)  Wt 156 lb 9.6 oz (71.033 kg)  BMI 25.29 kg/m2 PELVIC: External Genitalia: Normal BUS: Normal Vagina: Mild to moderate atrophy; first to second-degree cystocele; no rectocele; no enterocele Cervix: Surgically absent Uterus: Surgically absent Adnexa: Normal RV: Normal external exam; normal sphincter tone Bladder: Nontender  PROCEDURE: Pessary fitting  #2 ring with support diaphragm pessary is fitted-successful   ASSESSMENT: 1. First to second-degree cystocele 2. Pelvic pressure in female 3. Incomplete bladder emptying 4. Vaginal atrophy  PLAN: 1. #2 ring with support pessary is treated successfully today 2. Patient will be notified when pessary arrives for insertion 3. Continue estrogen cream twice a week intravaginal  Brayton Mars, MD  Note: This dictation was prepared with Dragon dictation along with smaller phrase technology. Any transcriptional errors that result from this process are unintentional.

## 2016-01-18 NOTE — Patient Instructions (Signed)
1. #2 ring with support pessary is treated successfully today 2. Patient will be notified when pessary arrives for insertion 3. Continue estrogen cream twice a week intravaginal

## 2016-02-15 ENCOUNTER — Encounter: Payer: Self-pay | Admitting: Obstetrics and Gynecology

## 2016-02-15 ENCOUNTER — Ambulatory Visit (INDEPENDENT_AMBULATORY_CARE_PROVIDER_SITE_OTHER): Payer: BLUE CROSS/BLUE SHIELD | Admitting: Obstetrics and Gynecology

## 2016-02-15 VITALS — BP 137/85 | HR 89 | Ht 66.0 in | Wt 155.1 lb

## 2016-02-15 DIAGNOSIS — N952 Postmenopausal atrophic vaginitis: Secondary | ICD-10-CM

## 2016-02-15 DIAGNOSIS — N8110 Cystocele, unspecified: Secondary | ICD-10-CM | POA: Diagnosis not present

## 2016-02-15 DIAGNOSIS — R339 Retention of urine, unspecified: Secondary | ICD-10-CM | POA: Diagnosis not present

## 2016-02-15 DIAGNOSIS — R102 Pelvic and perineal pain: Secondary | ICD-10-CM

## 2016-02-15 DIAGNOSIS — N9489 Other specified conditions associated with female genital organs and menstrual cycle: Secondary | ICD-10-CM

## 2016-02-15 DIAGNOSIS — IMO0002 Reserved for concepts with insufficient information to code with codable children: Secondary | ICD-10-CM

## 2016-02-15 NOTE — Progress Notes (Signed)
Chief complaint: 1. Pessary insertion 2. Cystocele, second-degree  Patient presents for pessary insertion-#2 ring with support  OBJECTIVE: BP 137/85 mmHg  Pulse 89  Ht 5\' 6"  (1.676 m)  Wt 155 lb 1.6 oz (70.353 kg)  BMI 25.05 kg/m2  PROCEDURE: PESSARY INSERTION #2 ring with support pessary is inserted  ASSESSMENT: 1. Second degree cystocele, symptomatic 2. Pessary insertion  PLAN: 1. Return in 2 weeks for pessary maintenance 2. Estrogen cream intravaginally once a week 3. Trimosan gel intravaginally once a week  Brayton Mars, MD  Note: This dictation was prepared with Dragon dictation along with smaller phrase technology. Any transcriptional errors that result from this process are unintentional.

## 2016-02-15 NOTE — Patient Instructions (Signed)
1. Pessary is inserted today 2. Use Trimosan gel weekly 3. Use Estrace cream weekly 4. Return in 2 weeks for pessary maintenance

## 2016-02-26 ENCOUNTER — Telehealth: Payer: Self-pay | Admitting: Obstetrics and Gynecology

## 2016-02-26 NOTE — Telephone Encounter (Signed)
Pt called because she recently had a pessary inserted and she is having issues with it. Please advise.

## 2016-02-26 NOTE — Telephone Encounter (Signed)
Pt had pessary inserted on 6/1. She has used trimosan x2 and premarin x 2. 3  Days ago she noticed some vaginal burning. NO vb,vo, or vd. NO uti sx. Advised to leave pessary out for several days. If sx gets better she will f/u with Dr. Keturah Barre on 6/28. If sx continue she will need to see [facility]. Will call pt on Wed. [phone].

## 2016-02-28 NOTE — Telephone Encounter (Signed)
Pt removed her pessary and burning is better. She had a little spotting when removing the pessary. Slight discomfort in lower abd. Pt will call be back if sx gets worse. Monday 6/19 we will schedule her to see mad sooner than the 28.

## 2016-02-29 ENCOUNTER — Ambulatory Visit: Payer: BLUE CROSS/BLUE SHIELD | Admitting: Obstetrics and Gynecology

## 2016-03-05 ENCOUNTER — Telehealth: Payer: Self-pay | Admitting: Obstetrics and Gynecology

## 2016-03-05 ENCOUNTER — Encounter: Payer: Self-pay | Admitting: Obstetrics and Gynecology

## 2016-03-05 ENCOUNTER — Ambulatory Visit (INDEPENDENT_AMBULATORY_CARE_PROVIDER_SITE_OTHER): Payer: BLUE CROSS/BLUE SHIELD | Admitting: Obstetrics and Gynecology

## 2016-03-05 VITALS — BP 165/104 | HR 97 | Ht 66.0 in | Wt 154.4 lb

## 2016-03-05 DIAGNOSIS — N8110 Cystocele, unspecified: Secondary | ICD-10-CM | POA: Diagnosis not present

## 2016-03-05 DIAGNOSIS — N952 Postmenopausal atrophic vaginitis: Secondary | ICD-10-CM | POA: Diagnosis not present

## 2016-03-05 DIAGNOSIS — IMO0002 Reserved for concepts with insufficient information to code with codable children: Secondary | ICD-10-CM

## 2016-03-05 DIAGNOSIS — N760 Acute vaginitis: Secondary | ICD-10-CM | POA: Diagnosis not present

## 2016-03-05 NOTE — Telephone Encounter (Signed)
Rx not at pharmacy that he told her he would send, just checking to see if Dr Tennis Must will send today. [place]

## 2016-03-05 NOTE — Progress Notes (Signed)
Chief complaint: 1. Vaginal burning 2. Vaginal discharge 3. Cystocele  Patient presents for follow-up on above issues. She had to remove her pessary 1 week ago because of symptoms.They are persisting at this time.  No UTI symptoms; no vaginal bleeding  OBJECTIVE: BP 165/104 mmHg  Pulse 97  Ht 5\' 6"  (1.676 m)  Wt 154 lb 6.4 oz (70.035 kg)  BMI 24.93 kg/m2 Pleasant white in no acute distress  Pelvic exam:   external Genitalia-normal BUS-normal Vagina-mild atrophy; minimal milky white secretions  PROCEDURE: Wet prep Normal saline-normal KOH-normal    ASSESSMENT: 1.Vaginitis,possibly monilial 2.Vaginal atrophy 3. Cystocele  PLAN: 1.Diflucan 150 mg orally once a week for 2 weeks 2. Continue with Premarin cream weekly 3. Return in 2 weeks for follow-up  A total of 15 minutes were spent face-to-face with the patient during this encounter and over half of that time dealt with counseling and coordination of care.  Brayton Mars, MD  Note: This dictation was prepared with Dragon dictation along with smaller phrase technology. Any transcriptional errors that result from this process are unintentional.

## 2016-03-05 NOTE — Patient Instructions (Signed)
1. Diflucan 150 mg once a week for 2 doses 2.Continue with Premarin cream intravaginal once a week 3.Return in 2 weeks for follow-up

## 2016-03-06 MED ORDER — FLUCONAZOLE 150 MG PO TABS: 150.0000 mg | ORAL_TABLET | Freq: Once | ORAL | Status: DC

## 2016-03-06 NOTE — Telephone Encounter (Signed)
Pt aware. Med erx. 

## 2016-03-13 ENCOUNTER — Ambulatory Visit (INDEPENDENT_AMBULATORY_CARE_PROVIDER_SITE_OTHER): Payer: BLUE CROSS/BLUE SHIELD | Admitting: Internal Medicine

## 2016-03-13 ENCOUNTER — Encounter: Payer: Self-pay | Admitting: Internal Medicine

## 2016-03-13 VITALS — BP 128/82 | HR 75 | Resp 16 | Ht 66.0 in | Wt 154.0 lb

## 2016-03-13 DIAGNOSIS — Z23 Encounter for immunization: Secondary | ICD-10-CM

## 2016-03-13 DIAGNOSIS — Z1159 Encounter for screening for other viral diseases: Secondary | ICD-10-CM | POA: Diagnosis not present

## 2016-03-13 DIAGNOSIS — E559 Vitamin D deficiency, unspecified: Secondary | ICD-10-CM | POA: Diagnosis not present

## 2016-03-13 DIAGNOSIS — M810 Age-related osteoporosis without current pathological fracture: Secondary | ICD-10-CM

## 2016-03-13 DIAGNOSIS — Z0000 Encounter for general adult medical examination without abnormal findings: Secondary | ICD-10-CM | POA: Diagnosis not present

## 2016-03-13 LAB — POC URINALYSIS WITH MICROSCOPIC (NON AUTO)MANUAL RESULT
Bacteria, UA: 0
Bilirubin, UA: NEGATIVE
Crystals: 0
Epithelial cells, urine per micros: 15
Glucose, UA: NEGATIVE
Ketones, UA: NEGATIVE
Mucus, UA: 0
Nitrite, UA: POSITIVE
Protein, UA: NEGATIVE
RBC: 0 M/uL — AB (ref 4.04–5.48)
Spec Grav, UA: 1.01
Urobilinogen, UA: 0.2
WBC Casts, UA: 0
pH, UA: 6

## 2016-03-13 NOTE — Progress Notes (Signed)
Date:  03/13/2016   Name:  Valerie Raymond   DOB:  23-Feb-1951   MRN:  [id number]   Chief Complaint: Annual Exam Valerie Raymond is a 65 y.o. female who presents today for her Complete Annual Exam. She feels well. She reports exercising none but working 10-12 hour days. She reports she is sleeping well. She denies breast problems.  Osteoporosis - doing well on Fosamax.  She is taking Vitamin D but not calcium.  She denies falls or side effects to medication.  GYN suggested she consider Evista since several family members had breast cancer.  Cystocele - recent trial of pessary did not go well.  She has a follow up appt soon to address issues.  She is wondering if she might need surgery instead.  She denies recurrence of UTI.  She did have a vaginitis - possible yeast - last week that was treated with diflucan.   Review of Systems  Constitutional: Negative for fever, chills and fatigue.  HENT: Positive for trouble swallowing. Negative for congestion, hearing loss, tinnitus and voice change.   Eyes: Negative for visual disturbance.  Respiratory: Negative for cough, chest tightness, shortness of breath and wheezing.   Cardiovascular: Negative for chest pain, palpitations and leg swelling.  Gastrointestinal: Negative for vomiting, abdominal pain, diarrhea and constipation.  Endocrine: Negative for polydipsia and polyuria.  Genitourinary: Negative for dysuria, frequency, vaginal bleeding, vaginal discharge and genital sores.  Musculoskeletal: Positive for arthralgias (hands). Negative for joint swelling and gait problem.  Skin: Negative for color change and rash.  Neurological: Negative for dizziness, tremors, light-headedness and headaches.  Hematological: Negative for adenopathy. Does not bruise/bleed easily.  Psychiatric/Behavioral: Negative for sleep disturbance and dysphoric mood. The patient is not nervous/anxious.     Patient Active Problem List   Diagnosis Date Noted  . Osteoporosis  01/02/2016  . Family history of breast cancer in first degree relative 01/02/2016  . Status post total abdominal hysterectomy and bilateral salpingo-oophorectomy (TAH-BSO) 01/02/2016  . Vaginal atrophy 01/02/2016  . Incomplete bladder emptying 01/02/2016  . Cystocele 01/02/2016  . Pelvic pressure in female 01/02/2016  . Colon, diverticulosis 07/23/2015  . Arthralgia of hip 07/23/2015  . Arthritis of knee, degenerative 07/23/2015  . OP (osteoporosis) 07/23/2015  . Avitaminosis D 07/23/2015    Prior to Admission medications   Medication Sig Start Date End Date Taking? Authorizing Provider  alendronate (FOSAMAX) 70 MG tablet TAKE 1 TABLET BY MOUTH WEEKLY( TAKE WITH WATER ONLY ON AN EMPTY STOMACH, REMAIN UPRIGHT WITHOUT EATING FOR 30 MINUTES AFTER TAKING) 01/07/16  Yes [name], MD  Cholecalciferol (VITAMIN D3) 1000 UNITS CAPS Take by mouth. am   Yes Historical Provider, MD  conjugated estrogens (PREMARIN) vaginal cream Place [id number] Applicatorfuls vaginally 2 (two) times a week. 1/2 gram twice weekly 01/02/16  Yes Brayton Mars, MD    Allergies  Allergen Reactions  . Lodine [Etodolac] Diarrhea and Nausea And Vomiting    Past Surgical History  Procedure Laterality Date  . Colonoscopy  2006  . Colonoscopy N/A 01/23/2015    Procedure: COLONOSCOPY;  Surgeon: Lucilla Lame, MD;  Location: [facility];  Service: Gastroenterology;  Laterality: N/A;  [id number]  . Abdominal hysterectomy  1992  . Salpingoophorectomy Bilateral     Social History  Substance Use Topics  . Smoking status: Never Smoker   . Smokeless tobacco: None  . Alcohol Use: No     Medication list has been reviewed and updated.   Physical  Exam  Constitutional: She is oriented to person, place, and time. She appears well-developed and well-nourished. No distress.  HENT:  Head: Normocephalic and atraumatic.  Right Ear: Tympanic membrane and ear canal normal.  Left Ear: Tympanic membrane and ear  canal normal.  Nose: Right sinus exhibits no maxillary sinus tenderness. Left sinus exhibits no maxillary sinus tenderness.  Mouth/Throat: Uvula is midline and oropharynx is clear and moist.  Eyes: Conjunctivae and EOM are normal. Right eye exhibits no discharge. Left eye exhibits no discharge. No scleral icterus.  Neck: Normal range of motion. Carotid bruit is not present. No erythema present. No thyromegaly present.  Cardiovascular: Normal rate, regular rhythm, normal heart sounds and normal pulses.   Pulmonary/Chest: Effort normal. No respiratory distress. She has no wheezes. Right breast exhibits no mass, no nipple discharge, no skin change and no tenderness. Left breast exhibits no mass, no nipple discharge, no skin change and no tenderness.  Abdominal: Soft. Bowel sounds are normal. There is no hepatosplenomegaly. There is no tenderness. There is no CVA tenderness.  Musculoskeletal: Normal range of motion.  Lymphadenopathy:    She has no cervical adenopathy.    She has no axillary adenopathy.  Neurological: She is alert and oriented to person, place, and time. She has normal reflexes. No cranial nerve deficit or sensory deficit.  Skin: Skin is warm, dry and intact. No rash noted.  Psychiatric: She has a normal mood and affect. Her speech is normal and behavior is normal. Thought content normal.  Nursing note and vitals reviewed.   BP 128/82 mmHg  Pulse 75  Resp 16  Ht 5\' 6"  (1.676 m)  Wt 154 lb (69.854 kg)  BMI 24.87 kg/m2  SpO2 98%  Assessment and Plan: 1. Annual physical exam Begin calcium 1000 mg per day - CBC with Differential/Platelet - Comprehensive metabolic panel - TSH - POC urinalysis w microscopic (non auto)  2. Need for Tdap vaccination - Tdap vaccine greater than or equal to 7yo IM  3. OP (osteoporosis) Continue Fosamax, vitamin D, add calcium DEXA next year  4. Avitaminosis D - VITAMIN D 25 Hydroxy (Vit-D Deficiency, Fractures)  5. Need for hepatitis C  screening test - Hepatitis C antibody   Halina Maidens, MD [facility]  03/13/2016

## 2016-03-13 NOTE — Patient Instructions (Signed)
Breast Self-Awareness Practicing breast self-awareness may pick up problems early, prevent significant medical complications, and possibly save your life. By practicing breast self-awareness, you can become familiar with how your breasts look and feel and if your breasts are changing. This allows you to notice changes early. It can also offer you some reassurance that your breast health is good. One way to learn what is normal for your breasts and whether your breasts are changing is to do a breast self-exam. If you find a lump or something that was not present in the past, it is best to contact your caregiver right away. Other findings that should be evaluated by your caregiver include nipple discharge, especially if it is bloody; skin changes or reddening; areas where the skin seems to be pulled in (retracted); or new lumps and bumps. Breast pain is seldom associated with cancer (malignancy), but should also be evaluated by a caregiver. HOW TO PERFORM A BREAST SELF-EXAM The best time to examine your breasts is 5-7 days after your menstrual period is over. During menstruation, the breasts are lumpier, and it may be more difficult to pick up changes. If you do not menstruate, have reached menopause, or had your uterus removed (hysterectomy), you should examine your breasts at regular intervals, such as monthly. If you are breastfeeding, examine your breasts after a feeding or after using a breast pump. Breast implants do not decrease the risk for lumps or tumors, so continue to perform breast self-exams as recommended. Talk to your caregiver about how to determine the difference between the implant and breast tissue. Also, talk about the amount of pressure you should use during the exam. Over time, you will become more familiar with the variations of your breasts and more comfortable with the exam. A breast self-exam requires you to remove all your clothes above the waist. 1. Look at your breasts and nipples.  Stand in front of a mirror in a room with good lighting. With your hands on your hips, push your hands firmly downward. Look for a difference in shape, contour, and size from one breast to the other (asymmetry). Asymmetry includes puckers, dips, or bumps. Also, look for skin changes, such as reddened or scaly areas on the breasts. Look for nipple changes, such as discharge, dimpling, repositioning, or redness. 2. Carefully feel your breasts. This is best done either in the shower or tub while using soapy water or when flat on your back. Place the arm (on the side of the breast you are examining) above your head. Use the pads (not the fingertips) of your three middle fingers on your opposite hand to feel your breasts. Start in the underarm area and use  inch (2 cm) overlapping circles to feel your breast. Use 3 different levels of pressure (light, medium, and firm pressure) at each circle before moving to the next circle. The light pressure is needed to feel the tissue closest to the skin. The medium pressure will help to feel breast tissue a little deeper, while the firm pressure is needed to feel the tissue close to the ribs. Continue the overlapping circles, moving downward over the breast until you feel your ribs below your breast. Then, move one finger-width towards the center of the body. Continue to use the  inch (2 cm) overlapping circles to feel your breast as you move slowly up toward the collar bone (clavicle) near the base of the neck. Continue the up and down exam using all 3 pressures until you reach the   middle of the chest. Do this with each breast, carefully feeling for lumps or changes. 3.  Keep a written record with breast changes or normal findings for each breast. By writing this information down, you do not need to depend only on memory for size, tenderness, or location. Write down where you are in your menstrual cycle, if you are still menstruating. Breast tissue can have some lumps or  thick tissue. However, see your caregiver if you find anything that concerns you.  SEEK MEDICAL CARE IF:  You see a change in shape, contour, or size of your breasts or nipples.   You see skin changes, such as reddened or scaly areas on the breasts or nipples.   You have an unusual discharge from your nipples.   You feel a new lump or unusually thick areas.    This information is not intended to replace advice given to you by your health care provider. Make sure you discuss any questions you have with your health care provider.   Document Released: 09/02/2005 Document Revised: 08/19/2012 Document Reviewed: 12/18/2011 Elsevier Interactive Patient Education 2016 [employer]  

## 2016-03-14 ENCOUNTER — Ambulatory Visit: Payer: BLUE CROSS/BLUE SHIELD | Admitting: Obstetrics and Gynecology

## 2016-03-14 LAB — COMPREHENSIVE METABOLIC PANEL
ALT: 22 IU/L (ref 0–32)
AST: 30 IU/L (ref 0–40)
Albumin/Globulin Ratio: 1.6 (ref 1.2–2.2)
Albumin: 4.5 g/dL (ref 3.6–4.8)
Alkaline Phosphatase: 74 IU/L (ref 39–117)
BUN/Creatinine Ratio: 12 (ref 12–28)
BUN: 8 mg/dL (ref 8–27)
Bilirubin Total: 0.3 mg/dL (ref 0.0–1.2)
CO2: 25 mmol/L (ref 18–29)
Calcium: 9.8 mg/dL (ref 8.7–10.3)
Chloride: 100 mmol/L (ref 96–106)
Creatinine, Ser: 0.66 mg/dL (ref 0.57–1.00)
GFR calc Af Amer: 108 mL/min/{1.73_m2} (ref >59)
GFR calc non Af Amer: 94 mL/min/{1.73_m2} (ref >59)
Globulin, Total: 2.9 g/dL (ref 1.5–4.5)
Glucose: 85 mg/dL (ref 65–99)
Potassium: 4.2 mmol/L (ref 3.5–5.2)
Sodium: 143 mmol/L (ref 134–144)
Total Protein: 7.4 g/dL (ref 6.0–8.5)

## 2016-03-14 LAB — CBC WITH DIFFERENTIAL/PLATELET
Basophils Absolute: 0 10*3/uL (ref 0.0–0.2)
Basos: 1 %
EOS (ABSOLUTE): 0.2 10*3/uL (ref 0.0–0.4)
Eos: 4 %
Hematocrit: 40.7 % (ref 34.0–46.6)
Hemoglobin: 13.8 g/dL (ref 11.1–15.9)
Immature Grans (Abs): 0 10*3/uL (ref 0.0–0.1)
Immature Granulocytes: 0 %
Lymphocytes Absolute: 0.8 10*3/uL (ref 0.7–3.1)
Lymphs: 16 %
MCH: 29.4 pg (ref 26.6–33.0)
MCHC: 33.9 g/dL (ref 31.5–35.7)
MCV: 87 fL (ref 79–97)
Monocytes Absolute: 0.4 10*3/uL (ref 0.1–0.9)
Monocytes: 9 %
Neutrophils Absolute: 3.4 10*3/uL (ref 1.4–7.0)
Neutrophils: 70 %
Platelets: 309 10*3/uL (ref 150–379)
RBC: 4.69 x10E6/uL (ref 3.77–5.28)
RDW: 13.9 % (ref 12.3–15.4)
WBC: 4.8 10*3/uL (ref 3.4–10.8)

## 2016-03-14 LAB — TSH: TSH: 2.06 u[IU]/mL (ref 0.450–4.500)

## 2016-03-14 LAB — HEPATITIS C ANTIBODY: Hep C Virus Ab: <0.1 s/co ratio (ref 0.0–0.9)

## 2016-03-14 LAB — VITAMIN D 25 HYDROXY (VIT D DEFICIENCY, FRACTURES): Vit D, 25-Hydroxy: 28.6 ng/mL — ABNORMAL LOW (ref 30.0–100.0)

## 2016-03-21 ENCOUNTER — Ambulatory Visit (INDEPENDENT_AMBULATORY_CARE_PROVIDER_SITE_OTHER): Payer: BLUE CROSS/BLUE SHIELD | Admitting: Obstetrics and Gynecology

## 2016-03-21 ENCOUNTER — Encounter: Payer: Self-pay | Admitting: Obstetrics and Gynecology

## 2016-03-21 VITALS — BP 146/89 | HR 76 | Ht 66.0 in | Wt 155.0 lb

## 2016-03-21 DIAGNOSIS — N760 Acute vaginitis: Secondary | ICD-10-CM | POA: Diagnosis not present

## 2016-03-21 DIAGNOSIS — N8110 Cystocele, unspecified: Secondary | ICD-10-CM

## 2016-03-21 DIAGNOSIS — IMO0002 Reserved for concepts with insufficient information to code with codable children: Secondary | ICD-10-CM

## 2016-03-21 DIAGNOSIS — N952 Postmenopausal atrophic vaginitis: Secondary | ICD-10-CM | POA: Diagnosis not present

## 2016-03-21 NOTE — Patient Instructions (Signed)
1. Return in 3 weeks for pessary maintenance 2. Use TrimoSan gel once a week intravaginal 3. Use Premarin cream once a week intravaginal

## 2016-03-21 NOTE — Progress Notes (Signed)
Chief complaint: 1. Cystocele 2. Monilia vaginitis follow-up 3. Atrophy of the vagina  Patient presents for 2 week follow-up after treatment for symptomatic vaginitis thought to be secondary to monilia. Patient feels much better at this time and is without symptomatology. Pessary is out.  Past medical history, past surgical history, problem list, medications, and allergies are reviewed  OBJECTIVE: BP 146/89 mmHg  Pulse 76  Ht 5\' 6"  (1.676 m)  Wt 155 lb (70.308 kg)  BMI 25.03 kg/m2 Pleasant white female in no acute distress. Pelvic exam:  external Genitalia-normal BUS-normal Vagina-mild atrophy; no discharge Bimanual-deferred  ASSESSMENT: 1. Monilia vaginitis, resolved; patient asymptomatic 2. Cystocele 3. Vaginal atrophy  NAME: 1. Reinsert pessary 2. Use Premarin cream intravaginal weekly 3. Use Trimo San gel intravaginal weekly 4. Return in 2 weeks for follow-up 5. Discussed options of management for pessary; encouraged patient to consider self maintenance with use of the device only during work hours  A total of 15 minutes were spent face-to-face with the patient during this encounter and over half of that time dealt with counseling and coordination of care.  Brayton Mars, MD  Note: This dictation was prepared with Dragon dictation along with smaller phrase technology. Any transcriptional errors that result from this process are unintentional.

## 2016-04-04 ENCOUNTER — Encounter: Payer: Self-pay | Admitting: Obstetrics and Gynecology

## 2016-04-04 ENCOUNTER — Ambulatory Visit (INDEPENDENT_AMBULATORY_CARE_PROVIDER_SITE_OTHER): Payer: BLUE CROSS/BLUE SHIELD | Admitting: Obstetrics and Gynecology

## 2016-04-04 ENCOUNTER — Ambulatory Visit: Payer: BLUE CROSS/BLUE SHIELD | Admitting: Obstetrics and Gynecology

## 2016-04-04 VITALS — BP 154/84 | HR 97 | Ht 66.0 in | Wt 153.3 lb

## 2016-04-04 DIAGNOSIS — IMO0002 Reserved for concepts with insufficient information to code with codable children: Secondary | ICD-10-CM

## 2016-04-04 DIAGNOSIS — Z4689 Encounter for fitting and adjustment of other specified devices: Secondary | ICD-10-CM

## 2016-04-04 DIAGNOSIS — N8110 Cystocele, unspecified: Secondary | ICD-10-CM | POA: Diagnosis not present

## 2016-04-04 DIAGNOSIS — N952 Postmenopausal atrophic vaginitis: Secondary | ICD-10-CM

## 2016-04-04 NOTE — Progress Notes (Signed)
Chief complaint: 1. Cystocele 2. Pessary maintenance   Patient presents for 2 week follow-up o pessary. No problems.  Patient is able to remove pessary as well as reinsert it. This was checked today.  OBJECTIVE: BP 154/84 mmHg  Pulse 97  Ht 5\' 6"  (1.676 m)  Wt 153 lb 4.8 oz (69.536 kg)  BMI 24.75 kg/m2 Pleasant white female in no acute distress. Pelvic exam:  external Genitalia-normal BUS-normal Vagina-mild atrophy; no discharge; cystocele, grade 2 Bimanual-deferred  The pessary is removed by patient successfully; pessary is reinserted by patient successfully.  ASSESSMENT: 1. Cystocele, with excellent support with pessary 2. Vaginal atrophy 3. Successful demonstration of personal pessary management   PLAN: 1. Return in 6 months for pessary maintenance 2. Use Premarin cream intravaginally weekly  A total of 15 minutes were spent face-to-face with the patient during this encounter and over half of that time dealt with counseling and coordination of care.  Brayton Mars, MD  Note: This dictation was prepared with Dragon dictation along with smaller phrase technology. Any transcriptional errors that result from this process are unintentional.

## 2016-04-04 NOTE — Patient Instructions (Signed)
1. Return in 6 months for pessary maintenance

## 2016-05-01 ENCOUNTER — Ambulatory Visit
Admission: RE | Admit: 2016-05-01 | Discharge: 2016-05-01 | Disposition: A | Discharge status: Home / Self Care | Payer: BLUE CROSS/BLUE SHIELD | Source: Ambulatory Visit | Attending: Internal Medicine | Admitting: Internal Medicine

## 2016-05-01 ENCOUNTER — Ambulatory Visit (INDEPENDENT_AMBULATORY_CARE_PROVIDER_SITE_OTHER): Payer: BLUE CROSS/BLUE SHIELD | Admitting: Internal Medicine

## 2016-05-01 ENCOUNTER — Encounter: Payer: Self-pay | Admitting: Internal Medicine

## 2016-05-01 VITALS — BP 132/78 | HR 60 | Temp 98.1°F | Resp 16 | Ht 66.0 in | Wt 153.0 lb

## 2016-05-01 DIAGNOSIS — S92354A Nondisplaced fracture of fifth metatarsal bone, right foot, initial encounter for closed fracture: Secondary | ICD-10-CM | POA: Insufficient documentation

## 2016-05-01 DIAGNOSIS — M79671 Pain in right foot: Secondary | ICD-10-CM | POA: Diagnosis not present

## 2016-05-01 DIAGNOSIS — X58XXXA Exposure to other specified factors, initial encounter: Secondary | ICD-10-CM | POA: Insufficient documentation

## 2016-05-01 NOTE — Progress Notes (Signed)
Date:  05/01/2016   Name:  Valerie Raymond   DOB:  11-23-1950   MRN:  [id number]   Chief Complaint: Foot Swelling (Pain and swelling in Right foot w/out injury. Reness and heat along with painful to touch and severe pain with walking x 6 weeks. ) She denies any injury twisting or stumbling. Hurts more as the day goes on when she is on her feet. It feels better if she wears a supportive shoe. His been no redness or swelling just tenderness. She has no history of gout.   Review of Systems  Constitutional: Negative for fatigue and fever.  Musculoskeletal: Positive for arthralgias and gait problem.    Patient Active Problem List   Diagnosis Date Noted  . Family history of breast cancer in first degree relative 01/02/2016  . Status post total abdominal hysterectomy and bilateral salpingo-oophorectomy (TAH-BSO) 01/02/2016  . Vaginal atrophy 01/02/2016  . Incomplete bladder emptying 01/02/2016  . Cystocele 01/02/2016  . Pelvic pressure in female 01/02/2016  . Colon, diverticulosis 07/23/2015  . Arthralgia of hip 07/23/2015  . Arthritis of knee, degenerative 07/23/2015  . OP (osteoporosis) 07/23/2015  . Avitaminosis D 07/23/2015    Prior to Admission medications   Medication Sig Start Date End Date Taking? Authorizing Provider  alendronate (FOSAMAX) 70 MG tablet TAKE 1 TABLET BY MOUTH WEEKLY( TAKE WITH WATER ONLY ON AN EMPTY STOMACH, REMAIN UPRIGHT WITHOUT EATING FOR 30 MINUTES AFTER TAKING) 01/07/16  Yes [name], MD  calcium acetate (PHOSLO) 667 MG capsule Take by mouth 3 (three) times daily with meals.   Yes Historical Provider, MD  Calcium Carbonate-Vit D-Min (CALCIUM 1200 PO) Take by mouth.   Yes Historical Provider, MD  Cholecalciferol (VITAMIN D3) 1000 UNITS CAPS Take by mouth. am   Yes Historical Provider, MD  conjugated estrogens (PREMARIN) vaginal cream Place [id number] Applicatorfuls vaginally 2 (two) times a week. 1/2 gram twice weekly 01/02/16  Yes Brayton Mars, MD      Allergies  Allergen Reactions  . Lodine [Etodolac] Diarrhea and Nausea And Vomiting    Past Surgical History:  Procedure Laterality Date  . ABDOMINAL HYSTERECTOMY  1992  . COLONOSCOPY  2006  . COLONOSCOPY N/A 01/23/2015   Procedure: COLONOSCOPY;  Surgeon: Lucilla Lame, MD;  Location: [place];  Service: Gastroenterology;  Laterality: N/A;  [id number]  . SALPINGOOPHORECTOMY Bilateral     Social History  Substance Use Topics  . Smoking status: Never Smoker  . Smokeless tobacco: Never Used  . Alcohol use No     Medication list has been reviewed and updated.   Physical Exam  Constitutional: She is oriented to person, place, and time. She appears well-developed. No distress.  HENT:  Head: Normocephalic and atraumatic.  Cardiovascular: Intact distal pulses.   Pulmonary/Chest: Effort normal. No respiratory distress.  Musculoskeletal: Normal range of motion.       Feet:  Neurological: She is alert and oriented to person, place, and time.  Skin: Skin is warm, dry and intact. No rash noted.  Psychiatric: She has a normal mood and affect. Her behavior is normal. Thought content normal.  Nursing note and vitals reviewed.   BP 132/78 (BP Location: Right Arm, Patient Position: Sitting, Cuff Size: Normal)   Pulse 60   Temp 98.1 F (36.7 C) (Oral)   Resp 16   Ht 5\' 6"  (1.676 m)   Wt 153 lb (69.4 kg)   SpO2 98%   BMI 24.69 kg/m   Assessment and  Plan: 1. Acute foot pain, right Xray shows healing fracture - referred to [facility]; Future - Ambulatory referral to [place], MD [facility]  05/01/2016

## 2016-07-01 DIAGNOSIS — S92354G Nondisplaced fracture of fifth metatarsal bone, right foot, subsequent encounter for fracture with delayed healing: Secondary | ICD-10-CM | POA: Diagnosis not present

## 2016-07-15 DIAGNOSIS — H2513 Age-related nuclear cataract, bilateral: Secondary | ICD-10-CM | POA: Diagnosis not present

## 2016-08-01 DIAGNOSIS — S92354G Nondisplaced fracture of fifth metatarsal bone, right foot, subsequent encounter for fracture with delayed healing: Secondary | ICD-10-CM | POA: Diagnosis not present

## 2016-10-03 ENCOUNTER — Ambulatory Visit: Payer: BLUE CROSS/BLUE SHIELD | Admitting: Obstetrics and Gynecology

## 2016-10-09 ENCOUNTER — Ambulatory Visit (INDEPENDENT_AMBULATORY_CARE_PROVIDER_SITE_OTHER): Payer: Medicare Other | Admitting: Internal Medicine

## 2016-10-09 ENCOUNTER — Encounter: Payer: Self-pay | Admitting: Internal Medicine

## 2016-10-09 VITALS — BP 162/108 | HR 95 | Temp 98.4°F | Ht 66.0 in | Wt 154.0 lb

## 2016-10-09 DIAGNOSIS — J0100 Acute maxillary sinusitis, unspecified: Secondary | ICD-10-CM

## 2016-10-09 DIAGNOSIS — J358 Other chronic diseases of tonsils and adenoids: Secondary | ICD-10-CM | POA: Diagnosis not present

## 2016-10-09 HISTORY — DX: Other chronic diseases of tonsils and adenoids: J35.8

## 2016-10-09 MED ORDER — GUAIFENESIN-CODEINE 100-10 MG/5ML PO SYRP: 5.0000 mL | ORAL_SOLUTION | Freq: Three times a day (TID) | ORAL | 0 refills | Status: DC | PRN

## 2016-10-09 MED ORDER — AMOXICILLIN-POT CLAVULANATE 875-125 MG PO TABS: 1.0000 | ORAL_TABLET | Freq: Two times a day (BID) | ORAL | 0 refills | Status: DC

## 2016-10-09 NOTE — Progress Notes (Signed)
Date:  10/09/2016   Name:  Valerie Raymond   DOB:  24-Oct-1950   MRN:  [id number]   Chief Complaint: Sinus Problem (Pt stated coughing, sinus pressure,body ache for 3 day) Sinus Problem  This is a new problem. The current episode started in the past 7 days. The problem has been gradually worsening since onset. There has been no fever. Associated symptoms include chills, congestion, coughing, headaches, sinus pressure and a sore throat. Pertinent negatives include no shortness of breath. Past treatments include acetaminophen. The treatment provided mild relief.      Review of Systems  Constitutional: Positive for chills and fatigue. Negative for fever.  HENT: Positive for congestion, sinus pressure and sore throat.   Respiratory: Positive for cough. Negative for chest tightness, shortness of breath and wheezing.   Cardiovascular: Negative for chest pain and palpitations.  Neurological: Positive for headaches. Negative for dizziness and seizures.    Patient Active Problem List   Diagnosis Date Noted  . Family history of breast cancer in first degree relative 01/02/2016  . Status post total abdominal hysterectomy and bilateral salpingo-oophorectomy (TAH-BSO) 01/02/2016  . Vaginal atrophy 01/02/2016  . Incomplete bladder emptying 01/02/2016  . Cystocele 01/02/2016  . Pelvic pressure in female 01/02/2016  . Colon, diverticulosis 07/23/2015  . Arthralgia of hip 07/23/2015  . Arthritis of knee, degenerative 07/23/2015  . OP (osteoporosis) 07/23/2015  . Avitaminosis D 07/23/2015    Prior to Admission medications   Medication Sig Start Date End Date Taking? Authorizing Provider  alendronate (FOSAMAX) 70 MG tablet TAKE 1 TABLET BY MOUTH WEEKLY( TAKE WITH WATER ONLY ON AN EMPTY STOMACH, REMAIN UPRIGHT WITHOUT EATING FOR 30 MINUTES AFTER TAKING) 01/07/16  Yes [name], MD  calcium acetate (PHOSLO) 667 MG capsule Take by mouth 3 (three) times daily with meals.   Yes Historical  Provider, MD  Calcium Carbonate-Vit D-Min (CALCIUM 1200 PO) Take by mouth.   Yes Historical Provider, MD  Cholecalciferol (VITAMIN D3) 1000 UNITS CAPS Take by mouth. am   Yes Historical Provider, MD  conjugated estrogens (PREMARIN) vaginal cream Place [id number] Applicatorfuls vaginally 2 (two) times a week. 1/2 gram twice weekly 01/02/16  Yes Brayton Mars, MD    Allergies  Allergen Reactions  . Lodine [Etodolac] Diarrhea and Nausea And Vomiting    Past Surgical History:  Procedure Laterality Date  . ABDOMINAL HYSTERECTOMY  1992  . COLONOSCOPY  2006  . COLONOSCOPY N/A 01/23/2015   Procedure: COLONOSCOPY;  Surgeon: Lucilla Lame, MD;  Location: [place];  Service: Gastroenterology;  Laterality: N/A;  [id number]  . SALPINGOOPHORECTOMY Bilateral     Social History  Substance Use Topics  . Smoking status: Never Smoker  . Smokeless tobacco: Never Used  . Alcohol use No     Medication list has been reviewed and updated.   Physical Exam  Constitutional: She is oriented to person, place, and time. She appears well-developed. No distress.  HENT:  Head: Normocephalic and atraumatic.  Right Ear: Tympanic membrane and ear canal normal.  Left Ear: Tympanic membrane and ear canal normal.  Nose: Right sinus exhibits maxillary sinus tenderness and frontal sinus tenderness. Left sinus exhibits maxillary sinus tenderness and frontal sinus tenderness.  Mouth/Throat: Tonsillar abscesses (tonsil stones bilaterally) present.  Cardiovascular: Normal rate, regular rhythm and normal heart sounds.   Pulmonary/Chest: Effort normal and breath sounds normal. No respiratory distress. She has no wheezes.  Musculoskeletal: Normal range of motion.  Neurological: She is alert and oriented  to person, place, and time.  Skin: Skin is warm and dry. No rash noted.  Psychiatric: She has a normal mood and affect. Her behavior is normal. Thought content normal.  Nursing note and vitals reviewed.   BP  (!) 162/108   Pulse 95   Temp 98.4 F (36.9 C)   Ht 5\' 6"  (1.676 m)   Wt 154 lb (69.9 kg)   SpO2 97%   BMI 24.86 kg/m   Assessment and Plan: 1. Acute non-recurrent maxillary sinusitis Continue tylenol, fluids, rest - amoxicillin-clavulanate (AUGMENTIN) 875-125 MG tablet; Take 1 tablet by mouth 2 (two) times daily.  Dispense: 20 tablet; Refill: 0 - guaiFENesin-codeine (ROBITUSSIN AC) 100-10 MG/5ML syrup; Take 5 mLs by mouth 3 (three) times daily as needed for cough.  Dispense: 150 mL; Refill: 0   Halina Maidens, MD [facility]  10/09/2016

## 2016-10-09 NOTE — Patient Instructions (Signed)
Continue tylenol around the clock for fever, headache and sore throat  Increase fluids

## 2016-10-15 ENCOUNTER — Encounter: Payer: Self-pay | Admitting: Obstetrics and Gynecology

## 2016-10-15 ENCOUNTER — Ambulatory Visit (INDEPENDENT_AMBULATORY_CARE_PROVIDER_SITE_OTHER): Payer: Medicare Other | Admitting: Obstetrics and Gynecology

## 2016-10-15 VITALS — BP 160/90 | HR 109 | Ht 66.0 in | Wt 155.3 lb

## 2016-10-15 DIAGNOSIS — Z4689 Encounter for fitting and adjustment of other specified devices: Secondary | ICD-10-CM | POA: Diagnosis not present

## 2016-10-15 DIAGNOSIS — N8111 Cystocele, midline: Secondary | ICD-10-CM

## 2016-10-15 DIAGNOSIS — R339 Retention of urine, unspecified: Secondary | ICD-10-CM | POA: Diagnosis not present

## 2016-10-15 DIAGNOSIS — N952 Postmenopausal atrophic vaginitis: Secondary | ICD-10-CM

## 2016-10-15 NOTE — Progress Notes (Signed)
Chief complaint: 1. Pessary maintenance 2. Cystocele 3. Incomplete bladder emptying 4. Vaginal atrophy  Patient presents for 6 month follow-up on pessary use. She is using a ring with support pessary. She is asymptomatic at this time. Bowel and bladder function are normal. She is not experiencing any vaginal discharge, vaginal bleeding, vaginal odor. She is very pleased with the benefits from the pessary.  Past medical history, past surgical history, problem list, medications, and allergies are reviewed  OBJECTIVE: BP (!) 160/90   Pulse (!) 109   Ht 5\' 6"  (1.676 m)   Wt 155 lb 4.8 oz (70.4 kg)   BMI 25.07 kg/m  Physical exam: Well-appearing white female in no acute distress. Alert and oriented. Abdomen: Soft, nontender Pelvic exam: External genitalia-normal BUS-normal Vagina-first to second-degree cystocele was present; no rectocele present; possible mild enterocele; fair estrogen effect within the vagina Bimanual-will masses or tenderness Rectovaginal-normal external exam  ASSESSMENT: 1. Normal pessary maintenance check-6 months 2. Cystocele, second-degree 3. A complete bladder emptying, improved with pessary 4. Vaginal atrophy, stable  PLAN: 1. Return in 6 months for pessary maintenance 2. Continue with Premarin cream intravaginal once or twice a week 3. Maintain regular follow-up with primary care  A total of 15 minutes were spent face-to-face with the patient during this encounter and over half of that time dealt with counseling and coordination of care.  Brayton Mars, MD  Note: This dictation was prepared with Dragon dictation along with smaller phrase technology. Any transcriptional errors that result from this process are unintentional.

## 2016-10-15 NOTE — Patient Instructions (Signed)
1. Return in 6 months for pessary maintenance 2. Continue using Premarin cream intravaginal once or twice a week

## 2016-10-24 ENCOUNTER — Encounter: Payer: Self-pay | Admitting: Internal Medicine

## 2016-10-24 ENCOUNTER — Ambulatory Visit (INDEPENDENT_AMBULATORY_CARE_PROVIDER_SITE_OTHER): Payer: Medicare Other | Admitting: Internal Medicine

## 2016-10-24 VITALS — BP 142/88 | HR 77 | Temp 97.3°F | Ht 66.0 in | Wt 153.0 lb

## 2016-10-24 DIAGNOSIS — J069 Acute upper respiratory infection, unspecified: Secondary | ICD-10-CM | POA: Diagnosis not present

## 2016-10-24 DIAGNOSIS — B9789 Other viral agents as the cause of diseases classified elsewhere: Secondary | ICD-10-CM

## 2016-10-24 MED ORDER — AMOXICILLIN-POT CLAVULANATE 875-125 MG PO TABS: 1.0000 | ORAL_TABLET | Freq: Two times a day (BID) | ORAL | 0 refills | Status: DC

## 2016-10-24 NOTE — Progress Notes (Signed)
Date:  10/24/2016   Name:  Valerie Raymond   DOB:  19-Sep-1950   MRN:  [id number]   Chief Complaint: Cough and Sinus Problem Cough  This is a new problem. The current episode started yesterday. The problem has been unchanged. The problem occurs hourly. The cough is non-productive. Pertinent negatives include no chest pain, chills, ear pain, fever, nasal congestion, rhinorrhea, sore throat, shortness of breath or wheezing.     Review of Systems  Constitutional: Negative for chills, fatigue and fever.  HENT: Negative for ear pain, rhinorrhea, sore throat and trouble swallowing.   Eyes: Negative for visual disturbance.  Respiratory: Positive for cough. Negative for chest tightness, shortness of breath and wheezing.   Cardiovascular: Negative for chest pain and palpitations.    Patient Active Problem List   Diagnosis Date Noted  . Tonsillith 10/09/2016  . Family history of breast cancer in first degree relative 01/02/2016  . Status post total abdominal hysterectomy and bilateral salpingo-oophorectomy (TAH-BSO) 01/02/2016  . Vaginal atrophy 01/02/2016  . Incomplete bladder emptying 01/02/2016  . Cystocele 01/02/2016  . Pelvic pressure in female 01/02/2016  . Colon, diverticulosis 07/23/2015  . Arthralgia of hip 07/23/2015  . Arthritis of knee, degenerative 07/23/2015  . OP (osteoporosis) 07/23/2015  . Avitaminosis D 07/23/2015    Prior to Admission medications   Medication Sig Start Date End Date Taking? Authorizing Provider  alendronate (FOSAMAX) 70 MG tablet TAKE 1 TABLET BY MOUTH WEEKLY( TAKE WITH WATER ONLY ON AN EMPTY STOMACH, REMAIN UPRIGHT WITHOUT EATING FOR 30 MINUTES AFTER TAKING) 01/07/16  Yes [name], MD  Calcium Carbonate-Vit D-Min (CALCIUM 1200 PO) Take by mouth.   Yes Historical Provider, MD  Cholecalciferol (VITAMIN D3) 1000 UNITS CAPS Take by mouth. am   Yes Historical Provider, MD  conjugated estrogens (PREMARIN) vaginal cream Place [id number] Applicatorfuls  vaginally 2 (two) times a week. 1/2 gram twice weekly 01/02/16  Yes Alanda Slim Defrancesco, MD  guaiFENesin-codeine (ROBITUSSIN AC) 100-10 MG/5ML syrup Take 5 mLs by mouth 3 (three) times daily as needed for cough. 10/09/16  Yes [name], MD    Allergies  Allergen Reactions  . Lodine [Etodolac] Diarrhea and Nausea And Vomiting    Past Surgical History:  Procedure Laterality Date  . ABDOMINAL HYSTERECTOMY  1992  . COLONOSCOPY  2006  . COLONOSCOPY N/A 01/23/2015   Procedure: COLONOSCOPY;  Surgeon: Lucilla Lame, MD;  Location: [place];  Service: Gastroenterology;  Laterality: N/A;  [id number]  . SALPINGOOPHORECTOMY Bilateral     Social History  Substance Use Topics  . Smoking status: Never Smoker  . Smokeless tobacco: Never Used  . Alcohol use No     Medication list has been reviewed and updated.   Physical Exam  Constitutional: She appears well-developed and well-nourished.  HENT:  Right Ear: Tympanic membrane and ear canal normal.  Left Ear: Tympanic membrane and ear canal normal.  Nose: Right sinus exhibits no maxillary sinus tenderness. Left sinus exhibits no maxillary sinus tenderness.  Mouth/Throat: Posterior oropharyngeal erythema present.  Neck: Normal range of motion. Neck supple.  Cardiovascular: Normal rate, regular rhythm and normal heart sounds.   Pulmonary/Chest: Breath sounds normal. No respiratory distress. She has no wheezes. She has no rhonchi.  Lymphadenopathy:       Head (right side): No submandibular adenopathy present.       Head (left side): No submandibular adenopathy present.    She has no cervical adenopathy.    BP (!) 142/88  Pulse 77   Temp 97.3 F (36.3 C)   Ht 5\' 6"  (1.676 m)   Wt 153 lb (69.4 kg)   SpO2 97%   BMI 24.69 kg/m   Assessment and Plan: 1. Viral URI Tylenol, cough syrup Take Augmentin only if s/s bacterial infection  Halina Maidens, MD [facility]  10/24/2016

## 2016-12-18 ENCOUNTER — Other Ambulatory Visit: Payer: Self-pay | Admitting: Internal Medicine

## 2016-12-27 ENCOUNTER — Ambulatory Visit (INDEPENDENT_AMBULATORY_CARE_PROVIDER_SITE_OTHER): Payer: Medicare Other | Admitting: Internal Medicine

## 2016-12-27 ENCOUNTER — Encounter: Payer: Self-pay | Admitting: Internal Medicine

## 2016-12-27 VITALS — BP 144/64 | HR 64 | Ht 66.0 in | Wt 151.2 lb

## 2016-12-27 DIAGNOSIS — M7071 Other bursitis of hip, right hip: Secondary | ICD-10-CM

## 2016-12-27 DIAGNOSIS — M7072 Other bursitis of hip, left hip: Secondary | ICD-10-CM

## 2016-12-27 MED ORDER — MELOXICAM 15 MG PO TABS: 15.0000 mg | ORAL_TABLET | Freq: Every day | ORAL | 2 refills | Status: DC

## 2016-12-27 NOTE — Progress Notes (Signed)
Date:  12/27/2016   Name:  Valerie Raymond   DOB:  1950-12-08   MRN:  [id number]   Chief Complaint: Hip Pain (Both hips- been hurting on and off for years. Had stress fracture on right foot and wore boot. Feels like it may have shifted hips. Pt can't sleep and lay on sides in bed. Its hard to stand up. The pain is a ache, and burns radiating down to knee. Left side does not radiate, only hurts in hip area. Never had steriod shots in hips. ) Hip Pain   There was no injury mechanism. The pain is present in the left hip and right hip. The quality of the pain is described as aching and cramping. The pain is moderate. The pain has been worsening since onset. Pertinent negatives include no numbness or tingling.  This may have started after she worked wearing a surgical boot for stress fracture in her foot last fall.    Review of Systems  Constitutional: Negative for chills and fever.  Musculoskeletal: Positive for arthralgias. Negative for back pain, gait problem and joint swelling.  Neurological: Negative for dizziness, tingling, weakness and numbness.  Psychiatric/Behavioral: Positive for sleep disturbance.    Patient Active Problem List   Diagnosis Date Noted  . Bursitis of both hips 12/27/2016  . Tonsillith 10/09/2016  . Family history of breast cancer in first degree relative 01/02/2016  . Status post total abdominal hysterectomy and bilateral salpingo-oophorectomy (TAH-BSO) 01/02/2016  . Vaginal atrophy 01/02/2016  . Incomplete bladder emptying 01/02/2016  . Cystocele 01/02/2016  . Pelvic pressure in female 01/02/2016  . Colon, diverticulosis 07/23/2015  . Arthralgia of hip 07/23/2015  . Arthritis of knee, degenerative 07/23/2015  . OP (osteoporosis) 07/23/2015  . Avitaminosis D 07/23/2015    Prior to Admission medications   Medication Sig Start Date End Date Taking? Authorizing Provider  alendronate (FOSAMAX) 70 MG tablet TAKE 1 TABLET BY MOUTH ONCE A WEEK WITH A GLASS OF  WATER ON A EMPTY STOMACH AND REMAIN UPRIGHT WITHOUT EATING FOR 30 MINUTES. 12/18/16  Yes [name], MD  amoxicillin-clavulanate (AUGMENTIN) 875-125 MG tablet Take 1 tablet by mouth 2 (two) times daily. 10/24/16  Yes [name], MD  Calcium Carbonate-Vit D-Min (CALCIUM 1200 PO) Take by mouth.   Yes Historical Provider, MD  Cholecalciferol (VITAMIN D3) 1000 UNITS CAPS Take by mouth. am   Yes Historical Provider, MD  conjugated estrogens (PREMARIN) vaginal cream Place 6.30 Applicatorfuls vaginally 2 (two) times a week. 1/2 gram twice weekly 01/02/16  Yes Alanda Slim Defrancesco, MD  guaiFENesin-codeine (ROBITUSSIN AC) 100-10 MG/5ML syrup Take 5 mLs by mouth 3 (three) times daily as needed for cough. 10/09/16  Yes [name], MD    Allergies  Allergen Reactions  . Lodine [Etodolac] Diarrhea and Nausea And Vomiting    Past Surgical History:  Procedure Laterality Date  . ABDOMINAL HYSTERECTOMY  1992  . COLONOSCOPY  2006  . COLONOSCOPY N/A 01/23/2015   Procedure: COLONOSCOPY;  Surgeon: Lucilla Lame, MD;  Location: [facility];  Service: Gastroenterology;  Laterality: N/A;  cecum- [id number]  . SALPINGOOPHORECTOMY Bilateral     Social History  Substance Use Topics  . Smoking status: Never Smoker  . Smokeless tobacco: Never Used  . Alcohol use No     Medication list has been reviewed and updated.   Physical Exam  Constitutional: She is oriented to person, place, and time. She appears well-developed. No distress.  HENT:  Head: Normocephalic and  atraumatic.  Cardiovascular: Normal rate, regular rhythm and normal heart sounds.   Pulmonary/Chest: Effort normal and breath sounds normal. No respiratory distress.  Musculoskeletal:       Right hip: She exhibits decreased range of motion and tenderness. She exhibits no swelling.       Left hip: She exhibits decreased range of motion and tenderness. She exhibits no swelling.       Lumbar back: Normal.  Discomfort to palpation over SI  joints SLR limited bilaterally by hip pain  Neurological: She is alert and oriented to person, place, and time.  Skin: Skin is warm and dry. No rash noted.  Psychiatric: She has a normal mood and affect. Her behavior is normal. Thought content normal.  Nursing note and vitals reviewed.   BP (!) 144/64 (BP Location: Right Arm, Patient Position: Sitting, Cuff Size: Normal)   Pulse 64   Ht 5\' 6"  (1.676 m)   Wt 151 lb 3.2 oz (68.6 kg)   SpO2 97%   BMI 24.40 kg/m   Assessment and Plan: 1. Bursitis of both hips, unspecified bursa Begin mobic daily Ice 15 minutes twice a day - Ambulatory referral to Orthopedic Surgery   Meds ordered this encounter  Medications  . meloxicam (MOBIC) 15 MG tablet    Sig: Take 1 tablet (15 mg total) by mouth daily.    Dispense:  30 tablet    Refill:  2    Halina Maidens, MD [facility]  12/27/2016

## 2016-12-27 NOTE — Patient Instructions (Signed)
Bursitis Bursitis is inflammation and irritation of a bursa, which is one of the small, fluid-filled sacs that cushion and protect the moving parts of your body. These sacs are located between bones and muscles, muscle attachments, or skin areas next to bones. A bursa protects these structures from the wear and tear that results from frequent movement. An inflamed bursa causes pain and swelling. Fluid may build up inside the sac. Bursitis is most common near joints, especially the knees, elbows, hips, and shoulders. What are the causes? Bursitis can be caused by:  Injury from:  A direct blow, like falling on your knee or elbow.  Overuse of a joint (repetitive stress).  Infection. This can happen if bacteria gets into a bursa through a cut or scrape near a joint.  Diseases that cause joint inflammation, such as gout and rheumatoid arthritis. What increases the risk? You may be at risk for bursitis if you:  Have a job or hobby that involves a lot of repetitive stress on your joints.  Have a condition that weakens your body's defense system (immune system), such as diabetes, cancer, or HIV.  Lift and reach overhead often.  Kneel or lean on hard surfaces often.  Run or walk often. What are the signs or symptoms? The most common signs and symptoms of bursitis are:  Pain that gets worse when you move the affected body part or put weight on it.  Inflammation.  Stiffness. Other signs and symptoms may include:  Redness.  Tenderness.  Warmth.  Pain that continues after rest.  Fever and chills. This may occur in bursitis caused by infection. How is this diagnosed? Bursitis may be diagnosed by:  Medical history and physical exam.  MRI.  A procedure to drain fluid from the bursa with a needle (aspiration). The fluid may be checked for signs of infection or gout.  Blood tests to rule out other causes of inflammation. How is this treated? Bursitis can usually be treated at  home with rest, ice, compression, and elevation (RICE). For mild bursitis, RICE treatment may be all you need. Other treatments may include:  Nonsteroidal anti-inflammatory drugs (NSAIDs) to treat pain and inflammation.  Corticosteroids to fight inflammation. You may have these drugs injected into and around the area of bursitis.  Aspiration of bursitis fluid to relieve pain and improve movement.  Antibiotic medicine to treat an infected bursa.  A splint, brace, or walking aid.  Physical therapy if you continue to have pain or limited movement.  Surgery to remove a damaged or infected bursa. This may be needed if you have a very bad case of bursitis or if other treatments have not worked. Follow these instructions at home:  Take medicines only as directed by your health care provider.  If you were prescribed an antibiotic medicine, finish it all even if you start to feel better.  Rest the affected area as directed by your health care provider.  Keep the area elevated.  Avoid activities that make pain worse.  Apply ice to the injured area:  Place ice in a plastic bag.  Place a towel between your skin and the bag.  Leave the ice on for 20 minutes, 2-3 times a day.  Use splints, braces, pads, or walking aids as directed by your health care provider.  Keep all follow-up visits as directed by your health care provider. This is important. How is this prevented?  Wear knee pads if you kneel often.  Wear sturdy running or walking shoes  that fit you well.  Take regular breaks from repetitive activity.  Warm up by stretching before doing any strenuous activity.  Maintain a healthy weight or lose weight as recommended by your health care provider. Ask your health care provider if you need help.  Exercise regularly. Start any new physical activity gradually. Contact a health care provider if:  Your bursitis is not responding to treatment or home care.  You have a  fever.  You have chills. This information is not intended to replace advice given to you by your health care provider. Make sure you discuss any questions you have with your health care provider. Document Released: 08/30/2000 Document Revised: 02/08/2016 Document Reviewed: 11/22/2013 Elsevier Interactive Patient Education  2017 [employer].

## 2016-12-30 ENCOUNTER — Other Ambulatory Visit: Payer: Self-pay | Admitting: Internal Medicine

## 2016-12-30 DIAGNOSIS — Z1231 Encounter for screening mammogram for malignant neoplasm of breast: Secondary | ICD-10-CM

## 2017-01-06 DIAGNOSIS — D229 Melanocytic nevi, unspecified: Secondary | ICD-10-CM | POA: Diagnosis not present

## 2017-01-13 ENCOUNTER — Ambulatory Visit
Admission: RE | Admit: 2017-01-13 | Discharge: 2017-01-13 | Disposition: A | Discharge status: Home / Self Care | Payer: Medicare Other | Source: Ambulatory Visit | Attending: Internal Medicine | Admitting: Internal Medicine

## 2017-01-13 DIAGNOSIS — Z1231 Encounter for screening mammogram for malignant neoplasm of breast: Secondary | ICD-10-CM | POA: Insufficient documentation

## 2017-01-14 DIAGNOSIS — M25551 Pain in right hip: Secondary | ICD-10-CM | POA: Diagnosis not present

## 2017-01-14 DIAGNOSIS — M25552 Pain in left hip: Secondary | ICD-10-CM | POA: Diagnosis not present

## 2017-01-14 DIAGNOSIS — M545 Low back pain: Secondary | ICD-10-CM | POA: Diagnosis not present

## 2017-01-16 ENCOUNTER — Other Ambulatory Visit: Payer: Self-pay | Admitting: Unknown Physician Specialty

## 2017-01-16 DIAGNOSIS — M25552 Pain in left hip: Principal | ICD-10-CM

## 2017-01-16 DIAGNOSIS — M545 Low back pain: Secondary | ICD-10-CM

## 2017-01-16 DIAGNOSIS — M79604 Pain in right leg: Secondary | ICD-10-CM

## 2017-01-16 DIAGNOSIS — M25551 Pain in right hip: Secondary | ICD-10-CM

## 2017-01-16 DIAGNOSIS — M79605 Pain in left leg: Secondary | ICD-10-CM

## 2017-01-29 ENCOUNTER — Ambulatory Visit
Admission: RE | Admit: 2017-01-29 | Discharge: 2017-01-29 | Disposition: A | Discharge status: Home / Self Care | Payer: Medicare Other | Source: Ambulatory Visit | Attending: Unknown Physician Specialty | Admitting: Unknown Physician Specialty

## 2017-01-29 DIAGNOSIS — M79604 Pain in right leg: Secondary | ICD-10-CM

## 2017-01-29 DIAGNOSIS — M5136 Other intervertebral disc degeneration, lumbar region: Secondary | ICD-10-CM | POA: Diagnosis not present

## 2017-01-29 DIAGNOSIS — M545 Low back pain: Secondary | ICD-10-CM | POA: Diagnosis not present

## 2017-01-29 DIAGNOSIS — M25551 Pain in right hip: Secondary | ICD-10-CM

## 2017-01-29 DIAGNOSIS — M48061 Spinal stenosis, lumbar region without neurogenic claudication: Secondary | ICD-10-CM | POA: Insufficient documentation

## 2017-01-29 DIAGNOSIS — M25552 Pain in left hip: Secondary | ICD-10-CM

## 2017-03-17 ENCOUNTER — Encounter: Payer: Self-pay | Admitting: Internal Medicine

## 2017-03-17 ENCOUNTER — Ambulatory Visit (INDEPENDENT_AMBULATORY_CARE_PROVIDER_SITE_OTHER): Payer: Medicare Other | Admitting: Internal Medicine

## 2017-03-17 VITALS — BP 142/86 | HR 81 | Ht 66.0 in | Wt 147.6 lb

## 2017-03-17 DIAGNOSIS — Z23 Encounter for immunization: Secondary | ICD-10-CM

## 2017-03-17 DIAGNOSIS — Z0000 Encounter for general adult medical examination without abnormal findings: Secondary | ICD-10-CM | POA: Diagnosis not present

## 2017-03-17 DIAGNOSIS — R1312 Dysphagia, oropharyngeal phase: Secondary | ICD-10-CM | POA: Diagnosis not present

## 2017-03-17 DIAGNOSIS — M818 Other osteoporosis without current pathological fracture: Secondary | ICD-10-CM | POA: Diagnosis not present

## 2017-03-17 DIAGNOSIS — M5136 Other intervertebral disc degeneration, lumbar region: Secondary | ICD-10-CM | POA: Diagnosis not present

## 2017-03-17 LAB — POCT URINALYSIS DIPSTICK
Bilirubin, UA: NEGATIVE
Glucose, UA: NEGATIVE
Ketones, UA: NEGATIVE
Leukocytes, UA: NEGATIVE
Nitrite, UA: NEGATIVE
Protein, UA: NEGATIVE
Spec Grav, UA: <=1.005 — AB (ref 1.010–1.025)
Urobilinogen, UA: 0.2 E.U./dL
pH, UA: 8 (ref 5.0–8.0)

## 2017-03-17 MED ORDER — MELOXICAM 15 MG PO TABS: 15.0000 mg | ORAL_TABLET | Freq: Every day | ORAL | 5 refills | Status: DC

## 2017-03-17 NOTE — Patient Instructions (Addendum)
Health Maintenance  Topic Date Due  . PNA vac Low Risk Adult (1 of 2 - PCV13) Prevnar-13 done today; PPV-23 due 1 yr  . HIV Screening  03/13/2021 (Originally 07/04/1966)  . INFLUENZA VACCINE  04/16/2017  . MAMMOGRAM  01/14/2019  . COLONOSCOPY  01/22/2025  . TETANUS/TDAP  03/13/2026  . DEXA SCAN  Completed  . Hepatitis C Screening  Completed   Pneumococcal Conjugate Vaccine (PCV13) What You Need to Know 1. Why get vaccinated? Vaccination can protect both children and adults from pneumococcal disease. Pneumococcal disease is caused by bacteria that can spread from person to person through close contact. It can cause ear infections, and it can also lead to more serious infections of the:  Lungs (pneumonia),  Blood (bacteremia), and  Covering of the brain and spinal cord (meningitis).  Pneumococcal pneumonia is most common among adults. Pneumococcal meningitis can cause deafness and brain damage, and it kills about 1 child in 10 who get it. Anyone can get pneumococcal disease, but children under 23 years of age and adults 50 years and older, people with certain medical conditions, and cigarette smokers are at the highest risk. Before there was a vaccine, the [place] saw:  more than 700 cases of meningitis,  about 13,000 blood infections,  about 5 million ear infections, and  about 200 deaths  in children under 5 each year from pneumococcal disease. Since vaccine became available, severe pneumococcal disease in these children has fallen by 88%. About 18,000 older adults die of pneumococcal disease each year in the [place]. Treatment of pneumococcal infections with penicillin and other drugs is not as effective as it used to be, because some strains of the disease have become resistant to these drugs. This makes prevention of the disease, through vaccination, even more important. 2. PCV13 vaccine Pneumococcal conjugate vaccine (called PCV13) protects against 13 types of  pneumococcal bacteria. PCV13 is routinely given to children at 2, 4, 6, and 69-73 months of age. It is also recommended for children and adults 29 to 60 years of age with certain health conditions, and for all adults 22 years of age and older. Your doctor can give you details. 3. Some people should not get this vaccine Anyone who has ever had a life-threatening allergic reaction to a dose of this vaccine, to an earlier pneumococcal vaccine called PCV7, or to any vaccine containing diphtheria toxoid (for example, DTaP), should not get PCV13. Anyone with a severe allergy to any component of PCV13 should not get the vaccine. Tell your doctor if the person being vaccinated has any severe allergies. If the person scheduled for vaccination is not feeling well, your healthcare provider might decide to reschedule the shot on another day. 4. Risks of a vaccine reaction With any medicine, including vaccines, there is a chance of reactions. These are usually mild and go away on their own, but serious reactions are also possible. Problems reported following PCV13 varied by age and dose in the series. The most common problems reported among children were:  About half became drowsy after the shot, had a temporary loss of appetite, or had redness or tenderness where the shot was given.  About 1 out of 3 had swelling where the shot was given.  About 1 out of 3 had a mild fever, and about 1 in 20 had a fever over 102.51F.  Up to about 8 out of 10 became fussy or irritable.  Adults have reported pain, redness, and swelling where the shot  was given; also mild fever, fatigue, headache, chills, or muscle pain. Young children who get PCV13 along with inactivated flu vaccine at the same time may be at increased risk for seizures caused by fever. Ask your doctor for more information. Problems that could happen after any vaccine:  People sometimes faint after a medical procedure, including vaccination. Sitting or lying  down for about 15 minutes can help prevent fainting, and injuries caused by a fall. Tell your doctor if you feel dizzy, or have vision changes or ringing in the ears.  Some older children and adults get severe pain in the shoulder and have difficulty moving the arm where a shot was given. This happens very rarely.  Any medication can cause a severe allergic reaction. Such reactions from a vaccine are very rare, estimated at about 1 in a million doses, and would happen within a few minutes to a few hours after the vaccination. As with any medicine, there is a very small chance of a vaccine causing a serious injury or death. The safety of vaccines is always being monitored. For more information, visit: http://www.aguilar.org/ 5. What if there is a serious reaction? What should I look for? Look for anything that concerns you, such as signs of a severe allergic reaction, very high fever, or unusual behavior. Signs of a severe allergic reaction can include hives, swelling of the face and throat, difficulty breathing, a fast heartbeat, dizziness, and weakness-usually within a few minutes to a few hours after the vaccination. What should I do?  If you think it is a severe allergic reaction or other emergency that can't wait, call 9-1-1 or get the person to the nearest hospital. Otherwise, call your doctor.  Reactions should be reported to the Vaccine Adverse Event Reporting System (VAERS). Your doctor should file this report, or you can do it yourself through the VAERS web site at www.vaers.SamedayNews.es, or by calling 323-118-4207. ? VAERS does not give medical advice. 6. The National Vaccine Injury Compensation Program The [employer] Vaccine Injury Compensation Program (VICP) is a federal program that was created to compensate people who may have been injured by certain vaccines. Persons who believe they may have been injured by a vaccine can learn about the program and about filing a claim by calling  ([phone] or visiting the [place] website at GoldCloset.com.ee. There is a time limit to file a claim for compensation. 7. How can I learn more?  Ask your healthcare provider. He or she can give you the vaccine package insert or suggest other sources of information.  Call your local or state health department.  Contact the Centers for Disease Control and Prevention (CDC): ? Call 418-099-7529 (1-800-CDC-INFO) or ? Visit CDC's website at http://hunter.com/ Vaccine Information Statement, PCV13 Vaccine (07/21/2014) This information is not intended to replace advice given to you by your health care provider. Make sure you discuss any questions you have with your health care provider. Document Released: 06/30/2006 Document Revised: 05/23/2016 Document Reviewed: 05/23/2016 Elsevier Interactive Patient Education  2017 [employer].

## 2017-03-17 NOTE — Progress Notes (Signed)
Patient: Valerie Raymond, Female    DOB: 1951-01-31, 66 y.o.   MRN: [id number] Visit Date: 03/17/2017  Today's Provider: Halina Maidens, MD   Chief Complaint  Patient presents with  . Medicare Wellness    Breast Exam.    Subjective:   Initial preventative physical exam Valerie Raymond is a 66 y.o. female who presents today for her Initial Preventative Physical Exam. She feels poorly due to back, hip and knee pain. She reports exercising none but working full time at [employer]. She reports she is sleeping fairly well.  Back Pain  This is a chronic problem. The problem has been gradually worsening since onset. The pain is present in the lumbar spine. The pain radiates to the left thigh. The pain is moderate. The pain is worse during the day. Pertinent negatives include no abdominal pain, chest pain, dysuria, fever or headaches. She has tried NSAIDs for the symptoms. The treatment provided moderate (planning ESI with pain management) relief.  Trouble swallowing - food is sticking in upper esophagus intermittently.  No particular situation or food type. Sometimes it will pass with relaxation but usually has to regurgitate.  No gerd hx.  She does take mobic and fosamax.  Review of Systems  Constitutional: Negative for chills, fatigue and fever.  HENT: Positive for trouble swallowing. Negative for congestion, hearing loss, tinnitus and voice change.   Eyes: Negative for visual disturbance.  Respiratory: Negative for cough, chest tightness, shortness of breath and wheezing.   Cardiovascular: Negative for chest pain, palpitations and leg swelling.  Gastrointestinal: Negative for abdominal distention, abdominal pain, constipation, diarrhea and vomiting.  Endocrine: Negative for polydipsia and polyuria.  Genitourinary: Negative for dysuria, frequency, genital sores, vaginal bleeding and vaginal discharge.  Musculoskeletal: Positive for arthralgias, back pain and myalgias. Negative for gait  problem and joint swelling.  Skin: Negative for color change and rash.  Neurological: Negative for dizziness, tremors, light-headedness and headaches.  Hematological: Negative for adenopathy. Does not bruise/bleed easily.  Psychiatric/Behavioral: Negative for dysphoric mood and sleep disturbance. The patient is not nervous/anxious.     Social History   Social History  . Marital status: Single    Spouse name: N/A  . Number of children: N/A  . Years of education: N/A   Occupational History  . Not on file.   Social History Main Topics  . Smoking status: Never Smoker  . Smokeless tobacco: Never Used  . Alcohol use No  . Drug use: No  . Sexual activity: Not Currently    Birth control/ protection: Surgical   Other Topics Concern  . Not on file   Social History Narrative  . No narrative on file    Patient Active Problem List   Diagnosis Date Noted  . Bursitis of both hips 12/27/2016  . Tonsillith 10/09/2016  . Family history of breast cancer in first degree relative 01/02/2016  . Status post total abdominal hysterectomy and bilateral salpingo-oophorectomy (TAH-BSO) 01/02/2016  . Vaginal atrophy 01/02/2016  . Incomplete bladder emptying 01/02/2016  . Cystocele 01/02/2016  . Pelvic pressure in female 01/02/2016  . Colon, diverticulosis 07/23/2015  . Arthralgia of hip 07/23/2015  . Arthritis of knee, degenerative 07/23/2015  . Other osteoporosis without current pathological fracture 07/23/2015  . Avitaminosis D 07/23/2015    Past Surgical History:  Procedure Laterality Date  . ABDOMINAL HYSTERECTOMY  1992  . COLONOSCOPY  2006  . COLONOSCOPY N/A 01/23/2015   Procedure: COLONOSCOPY;  Surgeon: Lucilla Lame, MD;  Location: [facility];  Service: Gastroenterology;  Laterality: N/A;  cecum- [id number]  . SALPINGOOPHORECTOMY Bilateral     Her family history includes Breast cancer in her cousin and maternal aunt; Breast cancer (age of onset: 25) in her sister; Breast cancer  (age of onset: 65) in her sister; Colon cancer in her paternal uncle; Pancreatic cancer in her mother; Prostate cancer in her father.     Previous Medications   ALENDRONATE (FOSAMAX) 70 MG TABLET    TAKE 1 TABLET BY MOUTH ONCE A WEEK WITH A GLASS OF WATER ON A EMPTY STOMACH AND REMAIN UPRIGHT WITHOUT EATING FOR 30 MINUTES.   CALCIUM CARBONATE-VIT D-MIN (CALCIUM 1200 PO)    Take by mouth.   CHOLECALCIFEROL (VITAMIN D3) 1000 UNITS CAPS    Take by mouth. am   CONJUGATED ESTROGENS (PREMARIN) VAGINAL CREAM    Place 6.96 Applicatorfuls vaginally 2 (two) times a week. 1/2 gram twice weekly   MELOXICAM (MOBIC) 15 MG TABLET    Take 1 tablet (15 mg total) by mouth daily.    Patient Care Team: [name], MD as PCP - General (Family Medicine)      Objective:   Vitals: BP (!) 142/86   Pulse 81   Ht 5\' 6"  (1.676 m)   Wt 147 lb 9.6 oz (67 kg)   SpO2 99%   BMI 23.82 kg/m   Physical Exam  Constitutional: She is oriented to person, place, and time. She appears well-developed and well-nourished. No distress.  HENT:  Head: Normocephalic and atraumatic.  Right Ear: Tympanic membrane and ear canal normal.  Left Ear: Tympanic membrane and ear canal normal.  Nose: Right sinus exhibits no maxillary sinus tenderness. Left sinus exhibits no maxillary sinus tenderness.  Mouth/Throat: Uvula is midline and oropharynx is clear and moist.  Eyes: Conjunctivae and EOM are normal. Right eye exhibits no discharge. Left eye exhibits no discharge. No scleral icterus.  Neck: Normal range of motion. Carotid bruit is not present. No erythema present. No thyromegaly present.  Cardiovascular: Normal rate, regular rhythm, normal heart sounds and normal pulses.   Pulmonary/Chest: Effort normal. No respiratory distress. She has no wheezes. Right breast exhibits no mass, no nipple discharge, no skin change and no tenderness. Left breast exhibits no mass, no nipple discharge, no skin change and no tenderness.    Abdominal: Soft. Bowel sounds are normal. There is no hepatosplenomegaly. There is no tenderness. There is no CVA tenderness.  Lymphadenopathy:    She has no cervical adenopathy.    She has no axillary adenopathy.  Neurological: She is alert and oriented to person, place, and time. She has normal reflexes. No cranial nerve deficit or sensory deficit.  Skin: Skin is warm, dry and intact. No rash noted.  Psychiatric: She has a normal mood and affect. Her speech is normal and behavior is normal. Thought content normal.  Nursing note and vitals reviewed.    Hearing Screening Comments: Patient has no difficulty hearing.  Vision Screening Comments: Patient see's eye doctor annually- wears glasses.   Activities of Daily Living In your present state of health, do you have any difficulty performing the following activities: 03/17/2017  Hearing? N  Vision? N  Difficulty concentrating or making decisions? N  Walking or climbing stairs? N  Dressing or bathing? N  Doing errands, shopping? N  Preparing Food and eating ? N  Using the Toilet? N  In the past six months, have you accidently leaked urine? N  Do you have problems with loss  of bowel control? N  Managing your Medications? N  Managing your Finances? N  Housekeeping or managing your Housekeeping? N  Some recent data might be hidden    Fall Risk Assessment Fall Risk  03/17/2017 03/13/2016  Falls in the past year? No No     Depression Screen PHQ 2/9 Scores 03/17/2017 03/13/2016  PHQ - 2 Score 0 0   6CIT Screen 03/17/2017  What Year? 0 points  What month? 0 points  What time? 0 points  Count back from 20 0 points  Months in reverse 0 points  Repeat phrase 2 points  Total Score 2     Medicare Initial Preventative Physical Exam  Reviewed patient's Family Medical History Reviewed and updated list of patient's medical providers Assessment of cognitive impairment was done Assessed patient's functional ability Established a written  schedule for health screening [place] Completed and Reviewed  Exercise Activities and Dietary recommendations Goals    . Weight < 200 lb (90.719 kg)       Immunization History  Administered Date(s) Administered  . Influenza-Unspecified 05/23/2015  . Tdap 03/13/2016  . Zoster 08/19/2011    Health Maintenance  Topic Date Due  . PNA vac Low Risk Adult (1 of 2 - PCV13) 07/04/2016  . HIV Screening  03/13/2021 (Originally 07/04/1966)  . INFLUENZA VACCINE  04/16/2017  . MAMMOGRAM  01/14/2019  . COLONOSCOPY  01/22/2025  . TETANUS/TDAP  03/13/2026  . DEXA SCAN  Completed  . Hepatitis C Screening  Completed     Discussed health benefits of physical activity, and encouraged her to engage in regular exercise appropriate for her age and condition.    ------------------------------------------------------------------------------------------------------------  Assessment & Plan:   1. Medicare annual wellness visit, initial Measures satisfied - POCT urinalysis dipstick  2. Other osteoporosis without current pathological fracture Continue fosamax  3. Degenerative disc disease, lumbar Seeing pain management Continue mobic - meloxicam (MOBIC) 15 MG tablet; Take 1 tablet (15 mg total) by mouth daily.  Dispense: 30 tablet; Refill: 5  4. Oropharyngeal dysphagia - CBC with Differential/Platelet - Comprehensive metabolic panel - TSH - DG Esophagus; Future  5. Need for pneumococcal vaccination - Pneumococcal conjugate vaccine 13-valent IM   Meds ordered this encounter  Medications  . meloxicam (MOBIC) 15 MG tablet    Sig: Take 1 tablet (15 mg total) by mouth daily.    Dispense:  30 tablet    Refill:  [place], MD [facility]  03/17/2017

## 2017-03-19 LAB — CBC WITH DIFFERENTIAL/PLATELET
Basophils Absolute: 0 10*3/uL (ref 0.0–0.2)
Basos: 1 %
EOS (ABSOLUTE): 0.2 10*3/uL (ref 0.0–0.4)
Eos: 5 %
Hematocrit: 38.5 % (ref 34.0–46.6)
Hemoglobin: 12.8 g/dL (ref 11.1–15.9)
Immature Grans (Abs): 0 10*3/uL (ref 0.0–0.1)
Immature Granulocytes: 0 %
Lymphocytes Absolute: 0.6 10*3/uL — ABNORMAL LOW (ref 0.7–3.1)
Lymphs: 14 %
MCH: 29 pg (ref 26.6–33.0)
MCHC: 33.2 g/dL (ref 31.5–35.7)
MCV: 87 fL (ref 79–97)
Monocytes Absolute: 0.4 10*3/uL (ref 0.1–0.9)
Monocytes: 9 %
Neutrophils Absolute: 3.4 10*3/uL (ref 1.4–7.0)
Neutrophils: 71 %
Platelets: 262 10*3/uL (ref 150–379)
RBC: 4.42 x10E6/uL (ref 3.77–5.28)
RDW: 14 % (ref 12.3–15.4)
WBC: 4.7 10*3/uL (ref 3.4–10.8)

## 2017-03-19 LAB — COMPREHENSIVE METABOLIC PANEL
ALT: 18 IU/L (ref 0–32)
AST: 28 IU/L (ref 0–40)
Albumin/Globulin Ratio: 1.9 (ref 1.2–2.2)
Albumin: 4.7 g/dL (ref 3.6–4.8)
Alkaline Phosphatase: 76 IU/L (ref 39–117)
BUN/Creatinine Ratio: 14 (ref 12–28)
BUN: 11 mg/dL (ref 8–27)
Bilirubin Total: 0.4 mg/dL (ref 0.0–1.2)
CO2: 21 mmol/L (ref 20–29)
Calcium: 9.8 mg/dL (ref 8.7–10.3)
Chloride: 103 mmol/L (ref 96–106)
Creatinine, Ser: 0.78 mg/dL (ref 0.57–1.00)
GFR calc Af Amer: 92 mL/min/{1.73_m2} (ref >59)
GFR calc non Af Amer: 80 mL/min/{1.73_m2} (ref >59)
Globulin, Total: 2.5 g/dL (ref 1.5–4.5)
Glucose: 84 mg/dL (ref 65–99)
Potassium: 4.1 mmol/L (ref 3.5–5.2)
Sodium: 144 mmol/L (ref 134–144)
Total Protein: 7.2 g/dL (ref 6.0–8.5)

## 2017-03-19 LAB — TSH: TSH: 1.86 u[IU]/mL (ref 0.450–4.500)

## 2017-03-20 ENCOUNTER — Ambulatory Visit
Admission: RE | Admit: 2017-03-20 | Discharge: 2017-03-20 | Disposition: A | Discharge status: Home / Self Care | Payer: Medicare Other | Source: Ambulatory Visit | Attending: Internal Medicine | Admitting: Internal Medicine

## 2017-03-20 DIAGNOSIS — R1312 Dysphagia, oropharyngeal phase: Secondary | ICD-10-CM | POA: Diagnosis present

## 2017-03-20 DIAGNOSIS — R4702 Dysphasia: Secondary | ICD-10-CM | POA: Diagnosis not present

## 2017-03-20 DIAGNOSIS — K228 Other specified diseases of esophagus: Secondary | ICD-10-CM | POA: Diagnosis not present

## 2017-03-21 ENCOUNTER — Other Ambulatory Visit: Payer: Self-pay | Admitting: Internal Medicine

## 2017-03-21 DIAGNOSIS — K222 Esophageal obstruction: Secondary | ICD-10-CM

## 2017-03-21 DIAGNOSIS — R1310 Dysphagia, unspecified: Secondary | ICD-10-CM | POA: Insufficient documentation

## 2017-03-21 DIAGNOSIS — R1319 Other dysphagia: Secondary | ICD-10-CM

## 2017-03-21 NOTE — Progress Notes (Signed)
Patient agrees for GI and wants to go to Dr. Roselyn Reef since she had colonoscopy with him. Please advise.

## 2017-03-24 ENCOUNTER — Other Ambulatory Visit: Payer: Self-pay

## 2017-03-24 ENCOUNTER — Telehealth: Payer: Self-pay

## 2017-03-24 DIAGNOSIS — R1310 Dysphagia, unspecified: Secondary | ICD-10-CM

## 2017-03-24 DIAGNOSIS — K222 Esophageal obstruction: Secondary | ICD-10-CM

## 2017-03-24 NOTE — Telephone Encounter (Signed)
Patient returned call to schedule EGD. EGD has been scheduled at [facility] for 04/21/17 with Dr. Allen Norris.

## 2017-03-27 DIAGNOSIS — M5416 Radiculopathy, lumbar region: Secondary | ICD-10-CM | POA: Diagnosis not present

## 2017-03-27 DIAGNOSIS — M5136 Other intervertebral disc degeneration, lumbar region: Secondary | ICD-10-CM | POA: Diagnosis not present

## 2017-04-03 ENCOUNTER — Ambulatory Visit (INDEPENDENT_AMBULATORY_CARE_PROVIDER_SITE_OTHER): Payer: Medicare Other | Admitting: Obstetrics and Gynecology

## 2017-04-03 ENCOUNTER — Encounter: Payer: Self-pay | Admitting: Obstetrics and Gynecology

## 2017-04-03 VITALS — BP 157/83 | HR 71 | Ht 66.0 in | Wt 145.6 lb

## 2017-04-03 DIAGNOSIS — N8111 Cystocele, midline: Secondary | ICD-10-CM

## 2017-04-03 DIAGNOSIS — Z4689 Encounter for fitting and adjustment of other specified devices: Secondary | ICD-10-CM | POA: Diagnosis not present

## 2017-04-03 DIAGNOSIS — Z90710 Acquired absence of both cervix and uterus: Secondary | ICD-10-CM | POA: Diagnosis not present

## 2017-04-03 DIAGNOSIS — Z90722 Acquired absence of ovaries, bilateral: Secondary | ICD-10-CM

## 2017-04-03 DIAGNOSIS — Z9079 Acquired absence of other genital organ(s): Secondary | ICD-10-CM | POA: Diagnosis not present

## 2017-04-03 DIAGNOSIS — N952 Postmenopausal atrophic vaginitis: Secondary | ICD-10-CM | POA: Diagnosis not present

## 2017-04-03 DIAGNOSIS — R339 Retention of urine, unspecified: Secondary | ICD-10-CM | POA: Diagnosis not present

## 2017-04-03 MED ORDER — ESTROGENS, CONJUGATED 0.625 MG/GM VA CREA: 0.2500 | TOPICAL_CREAM | VAGINAL | 12 refills | Status: DC

## 2017-04-03 NOTE — Patient Instructions (Signed)
1. Continue with Premarin cream intravaginal once or twice a week 2. Continue with routine pessary maintenance inserting and removing the pessary daily 3. Return in 1 year for pessary maintenance or as needed if symptoms of vaginal discharge, vaginal pain, vaginal bleeding develops

## 2017-04-03 NOTE — Progress Notes (Signed)
Chief complaint: 1. Pessary maintenance (last visit 10/15/2016) 2. Cystocele 3. Incomplete bladder emptying 4. Vaginal atrophy 5. Status post TAH/BSO  Patient presents for 6 month follow-up on pessary use. She is using a ring with support pessary. She is asymptomatic at this time. Bowel and bladder function are normal. She is not experiencing any vaginal discharge, vaginal bleeding, vaginal odor. She is very pleased with the benefits from the pessary. She is removing it and inserting it daily and notes excellent results with alleviation of all symptoms.  Past medical history, past surgical history, problem list, medications, and allergies are reviewed  OBJECTIVE: BP (!) 157/83   Pulse 71   Ht 5\' 6"  (1.676 m)   Wt 145 lb 9.6 oz (66 kg)   BMI 23.50 kg/m  Physical exam: Well-appearing white female in no acute distress. Alert and oriented. Abdomen: Soft, nontender Pelvic exam: External genitalia-normal BUS-normal Vagina-first to second-degree cystocele was present; no rectocele present; possible mild enterocele; fair estrogen effect within the vagina; no vaginal abrasion, ulceration, or discharge Bimanual-without masses or tenderness Rectovaginal-normal external exam  ASSESSMENT: 1. Normal pessary maintenance check-6 months 2. Cystocele, second-degree 3. Incomplete bladder emptying, improved with pessary 4. Vaginal atrophy, stable  PLAN: 1. Return in 1 year for pessary maintenance 2. Continue with Premarin cream intravaginal once or twice a week 3. Maintain regular follow-up with Dr. Army Melia for routine physical  A total of 15 minutes were spent face-to-face with the patient during this encounter and over half of that time dealt with counseling and coordination of care.  Brayton Mars, MD  Note: This dictation was prepared with Dragon dictation along with smaller phrase technology. Any transcriptional errors that result from this process are  unintentional.

## 2017-04-10 ENCOUNTER — Encounter: Payer: Medicare Other | Admitting: Obstetrics and Gynecology

## 2017-04-14 ENCOUNTER — Encounter: Payer: Self-pay | Admitting: *Deleted

## 2017-04-16 NOTE — Discharge Instructions (Signed)
General Anesthesia, Adult, Care After °These instructions provide you with information about caring for yourself after your procedure. Your health care provider may also give you more specific instructions. Your treatment has been planned according to current medical practices, but problems sometimes occur. Call your health care provider if you have any problems or questions after your procedure. °What can I expect after the procedure? °After the procedure, it is common to have: °· Vomiting. °· A sore throat. °· Mental slowness. ° °It is common to feel: °· Nauseous. °· Cold or shivery. °· Sleepy. °· Tired. °· Sore or achy, even in parts of your body where you did not have surgery. ° °Follow these instructions at home: °For at least 24 hours after the procedure: °· Do not: °? Participate in activities where you could fall or become injured. °? Drive. °? Use heavy machinery. °? Drink alcohol. °? Take sleeping pills or medicines that cause drowsiness. °? Make important decisions or sign legal documents. °? Take care of children on your own. °· Rest. °Eating and drinking °· If you vomit, drink water, juice, or soup when you can drink without vomiting. °· Drink enough fluid to keep your urine clear or pale yellow. °· Make sure you have little or no nausea before eating solid foods. °· Follow the diet recommended by your health care provider. °General instructions °· Have a responsible adult stay with you until you are awake and alert. °· Return to your normal activities as told by your health care provider. Ask your health care provider what activities are safe for you. °· Take over-the-counter and prescription medicines only as told by your health care provider. °· If you smoke, do not smoke without supervision. °· Keep all follow-up visits as told by your health care provider. This is important. °Contact a health care provider if: °· You continue to have nausea or vomiting at home, and medicines are not helpful. °· You  cannot drink fluids or start eating again. °· You cannot urinate after 8-12 hours. °· You develop a skin rash. °· You have fever. °· You have increasing redness at the site of your procedure. °Get help right away if: °· You have difficulty breathing. °· You have chest pain. °· You have unexpected bleeding. °· You feel that you are having a life-threatening or urgent problem. °This information is not intended to replace advice given to you by your health care provider. Make sure you discuss any questions you have with your health care provider. °Document Released: 12/09/2000 Document Revised: 02/05/2016 Document Reviewed: 08/17/2015 °Elsevier Interactive Patient Education © 2018 [employer]. ° °

## 2017-04-21 ENCOUNTER — Encounter
Admission: RE | Disposition: A | Discharge status: Home / Self Care | Payer: Self-pay | Source: Ambulatory Visit | Attending: Gastroenterology

## 2017-04-21 ENCOUNTER — Ambulatory Visit: Payer: Medicare Other | Admitting: Anesthesiology

## 2017-04-21 ENCOUNTER — Ambulatory Visit
Admission: RE | Admit: 2017-04-21 | Discharge: 2017-04-21 | Disposition: A | Discharge status: Home / Self Care | Payer: Medicare Other | Source: Ambulatory Visit | Attending: Gastroenterology | Admitting: Gastroenterology

## 2017-04-21 DIAGNOSIS — K222 Esophageal obstruction: Secondary | ICD-10-CM | POA: Insufficient documentation

## 2017-04-21 DIAGNOSIS — E559 Vitamin D deficiency, unspecified: Secondary | ICD-10-CM | POA: Insufficient documentation

## 2017-04-21 DIAGNOSIS — R1310 Dysphagia, unspecified: Secondary | ICD-10-CM | POA: Diagnosis not present

## 2017-04-21 DIAGNOSIS — M1990 Unspecified osteoarthritis, unspecified site: Secondary | ICD-10-CM | POA: Insufficient documentation

## 2017-04-21 DIAGNOSIS — R933 Abnormal findings on diagnostic imaging of other parts of digestive tract: Secondary | ICD-10-CM | POA: Diagnosis not present

## 2017-04-21 DIAGNOSIS — M810 Age-related osteoporosis without current pathological fracture: Secondary | ICD-10-CM | POA: Insufficient documentation

## 2017-04-21 HISTORY — PX: ESOPHAGEAL DILATION: SHX303

## 2017-04-21 HISTORY — PX: ESOPHAGOGASTRODUODENOSCOPY: SHX5428

## 2017-04-21 SURGERY — EGD (ESOPHAGOGASTRODUODENOSCOPY)
Anesthesia: General | Wound class: Clean Contaminated

## 2017-04-21 MED ORDER — LACTATED RINGERS IV SOLN
10.0000 mL/h | INTRAVENOUS | Status: DC
  Administered 2017-04-21: 10 mL/h via INTRAVENOUS

## 2017-04-21 MED ORDER — SODIUM CHLORIDE 0.9 % IV SOLN: INTRAVENOUS | Status: DC

## 2017-04-21 MED ORDER — LIDOCAINE HCL (CARDIAC) 20 MG/ML IV SOLN
INTRAVENOUS | Status: DC | PRN
  Administered 2017-04-21: 30 mg via INTRAVENOUS

## 2017-04-21 MED ORDER — SIMETHICONE 40 MG/0.6ML PO SUSP
ORAL | Status: DC | PRN
  Administered 2017-04-21: 09:00:00

## 2017-04-21 MED ORDER — PROPOFOL 10 MG/ML IV BOLUS
INTRAVENOUS | Status: DC | PRN
  Administered 2017-04-21: 100 mg via INTRAVENOUS

## 2017-04-21 SURGICAL SUPPLY — 32 items
BALLN DILATOR 10-12 8 (BALLOONS)
BALLN DILATOR 12-15 8 (BALLOONS) ×4
BALLN DILATOR 15-18 8 (BALLOONS)
BALLN DILATOR CRE 0-12 8 (BALLOONS)
BALLN DILATOR ESOPH 8 10 CRE (MISCELLANEOUS) IMPLANT
BALLOON DILATOR 12-15 8 (BALLOONS) ×2 IMPLANT
BALLOON DILATOR 15-18 8 (BALLOONS) IMPLANT
BALLOON DILATOR CRE 0-12 8 (BALLOONS) IMPLANT
BLOCK BITE 60FR ADLT L/F GRN (MISCELLANEOUS) ×4 IMPLANT
CANISTER SUCT 1200ML W/VALVE (MISCELLANEOUS) ×4 IMPLANT
CLIP HMST 235XBRD CATH ROT (MISCELLANEOUS) IMPLANT
CLIP RESOLUTION 360 11X235 (MISCELLANEOUS)
FCP ESCP3.2XJMB 240X2.8X (MISCELLANEOUS)
FORCEPS BIOP RAD 4 LRG CAP 4 (CUTTING FORCEPS) IMPLANT
FORCEPS BIOP RJ4 240 W/NDL (MISCELLANEOUS)
FORCEPS ESCP3.2XJMB 240X2.8X (MISCELLANEOUS) IMPLANT
GOWN CVR UNV OPN BCK APRN NK (MISCELLANEOUS) ×4 IMPLANT
GOWN ISOL THUMB LOOP REG UNIV (MISCELLANEOUS) ×4
INJECTOR VARIJECT VIN23 (MISCELLANEOUS) IMPLANT
KIT DEFENDO VALVE AND CONN (KITS) IMPLANT
KIT ENDO PROCEDURE OLY (KITS) ×4 IMPLANT
MARKER SPOT ENDO TATTOO 5ML (MISCELLANEOUS) IMPLANT
PAD GROUND ADULT SPLIT (MISCELLANEOUS) IMPLANT
RETRIEVER NET PLAT FOOD (MISCELLANEOUS) IMPLANT
SNARE SHORT THROW 13M SML OVAL (MISCELLANEOUS) IMPLANT
SNARE SHORT THROW 30M LRG OVAL (MISCELLANEOUS) IMPLANT
SPOT EX ENDOSCOPIC TATTOO (MISCELLANEOUS)
SYR INFLATION 60ML (SYRINGE) ×4 IMPLANT
TRAP ETRAP POLY (MISCELLANEOUS) IMPLANT
VARIJECT INJECTOR VIN23 (MISCELLANEOUS)
WATER STERILE IRR 250ML POUR (IV SOLUTION) ×4 IMPLANT
WIRE CRE 18-20MM 8CM F G (MISCELLANEOUS) IMPLANT

## 2017-04-21 NOTE — Op Note (Signed)
[facility] Gastroenterology Patient Name: Valerie Raymond Procedure Date: 04/21/2017 8:56 AM MRN: [id number] Account #: [id number] Date of Birth: [birth year] Admit Type: Outpatient Age: 66 Room: [facility] OR ROOM 01 Gender: Female Note Status: Finalized Procedure:            Upper GI endoscopy Indications:          Dysphagia, Abnormal UGI series Providers:            Lucilla Lame MD, MD Referring MD:         Halina Maidens, MD (Referring MD) Medicines:            Propofol per Anesthesia Complications:        No immediate complications. Procedure:            Pre-Anesthesia Assessment:                       - Prior to the procedure, a History and Physical was                        performed, and patient medications and allergies were                        reviewed. The patient's tolerance of previous                        anesthesia was also reviewed. The risks and benefits of                        the procedure and the sedation options and risks were                        discussed with the patient. All questions were                        answered, and informed consent was obtained. Prior                        Anticoagulants: The patient has taken no previous                        anticoagulant or antiplatelet agents. ASA Grade                        Assessment: II - A patient with mild systemic disease.                        After reviewing the risks and benefits, the patient was                        deemed in satisfactory condition to undergo the                        procedure.                       After obtaining informed consent, the endoscope was                        passed under direct vision. Throughout the procedure,  the patient's blood pressure, pulse, and oxygen                        saturations were monitored continuously. The Olympus                        190 Endoscope [id number]) was introduced through the          mouth, and advanced to the second part of duodenum. The                        upper GI endoscopy was accomplished without difficulty.                        The patient tolerated the procedure well. Findings:      One moderate benign-appearing, intrinsic stenosis was found at the       gastroesophageal junction. And was traversed. A TTS dilator was passed       through the scope. Dilation with a 12-13.5-15 mm balloon dilator was       performed to 15 mm. The dilation site was examined following endoscope       reinsertion and showed moderate improvement in luminal narrowing.      The stomach was normal.      The examined duodenum was normal. Impression:           - Benign-appearing esophageal stenosis. Dilated.                       - Normal stomach.                       - Normal examined duodenum.                       - No specimens collected. Recommendation:       - Discharge patient to home.                       - Resume previous diet.                       - Continue present medications.                       - Repeat upper endoscopy PRN for retreatment. Procedure Code(s):    --- Professional ---                       [phone], Esophagogastroduodenoscopy, flexible, transoral;                        with transendoscopic balloon dilation of esophagus                        (less than 30 mm diameter) Diagnosis Code(s):    --- Professional ---                       R13.10, Dysphagia, unspecified                       R93.3, Abnormal findings on diagnostic imaging of other  parts of digestive tract                       K22.2, Esophageal obstruction CPT copyright 2016 American Medical Association. All rights reserved. The codes documented in this report are preliminary and upon coder review may  be revised to meet current compliance requirements. Lucilla Lame MD, MD 04/21/2017 9:09:23 AM This report has been signed electronically. Number of Addenda: 0 Note  Initiated On: 04/21/2017 8:56 AM Total Procedure Duration: 0 hours 3 minutes 37 seconds       [facility]

## 2017-04-21 NOTE — H&P (Signed)
Valerie Lame, MD [facility] [address] [place], [place] [address] Phone:[phone] Fax : [phone]  Primary Care Physician:  [name], MD Primary Gastroenterologist:  Dr. Allen Norris  Pre-Procedure History & Physical: HPI:  Valerie Raymond is a 66 y.o. female is here for an endoscopy.   Past Medical History:  Diagnosis Date  . Arthritis    knee,hip,hands  . DJD (degenerative joint disease)   . Dysphagia    with solids and regurgitation  . Endometriosis    h/o  . Heartburn   . Joint pain   . Osteoporosis    hip  . Rash    skin rash and itching  . Vitamin D deficiency     Past Surgical History:  Procedure Laterality Date  . ABDOMINAL HYSTERECTOMY  1992  . COLONOSCOPY  2006  . COLONOSCOPY N/A 01/23/2015   Procedure: COLONOSCOPY;  Surgeon: Valerie Lame, MD;  Location: [place];  Service: Gastroenterology;  Laterality: N/A;  cecum- 6270  . SALPINGOOPHORECTOMY Bilateral     Prior to Admission medications   Medication Sig Start Date End Date Taking? Authorizing Provider  alendronate (FOSAMAX) 70 MG tablet TAKE 1 TABLET BY MOUTH ONCE A WEEK WITH A GLASS OF WATER ON A EMPTY STOMACH AND REMAIN UPRIGHT WITHOUT EATING FOR 30 MINUTES. 12/18/16  Yes [name], MD  Calcium Carbonate-Vit D-Min (CALCIUM 1200 PO) Take by mouth.   Yes [provider]  Cholecalciferol (VITAMIN D3) 1000 UNITS CAPS Take by mouth. am   Yes [provider]  meloxicam (MOBIC) 15 MG tablet Take 1 tablet (15 mg total) by mouth daily. 03/17/17  Yes [name], MD  conjugated estrogens (PREMARIN) vaginal cream Place 3.50 Applicatorfuls vaginally 2 (two) times a week. 1/2 gram twice weekly 04/03/17   Defrancesco, Alanda Slim, MD    Allergies as of 03/24/2017 - Review Complete 03/17/2017  Allergen Reaction Noted  . Lodine [etodolac] Diarrhea and Nausea And Vomiting 01/18/2015    Family History  Problem Relation Age of Onset  . Pancreatic cancer Mother   . Breast  cancer Sister 64  . Prostate cancer Father   . Breast cancer Maternal Aunt   . Breast cancer Cousin        pat cousin  . Breast cancer Sister 56  . Colon cancer Paternal Uncle   . Ovarian cancer Neg Hx   . Diabetes Neg Hx     Social History   Social History  . Marital status: Single    Spouse name: N/A  . Number of children: N/A  . Years of education: N/A   Occupational History  . Not on file.   Social History Main Topics  . Smoking status: Never Smoker  . Smokeless tobacco: Never Used  . Alcohol use No  . Drug use: No  . Sexual activity: Not Currently    Birth control/ protection: Surgical   Other Topics Concern  . Not on file   Social History Narrative  . No narrative on file    Review of Systems: See HPI, otherwise negative ROS  Physical Exam: BP (!) 152/82   Pulse 75   Temp 98.6 F (37 C)   Ht 5\' 6"  (1.676 m)   Wt 144 lb (65.3 kg)   SpO2 97%   BMI 23.24 kg/m  General:   Alert,  pleasant and cooperative in NAD Head:  Normocephalic and atraumatic. Neck:  Supple; no masses or thyromegaly. Lungs:  Clear throughout to auscultation.  Heart:  Regular rate and rhythm. Abdomen:  Soft, nontender and nondistended. Normal bowel sounds, without guarding, and without rebound.   Neurologic:  Alert and  oriented x4;  grossly normal neurologically.  Impression/Plan: [name] is here for an endoscopy to be performed for Dysphagia  Risks, benefits, limitations, and alternatives regarding  endoscopy have been reviewed with the patient.  Questions have been answered.  All parties agreeable.   Valerie Lame, MD  04/21/2017, 7:49 AM

## 2017-04-21 NOTE — Anesthesia Postprocedure Evaluation (Signed)
Anesthesia Post Note  Patient: Valerie Raymond  Procedure(s) Performed: Procedure(s) (LRB): ESOPHAGOGASTRODUODENOSCOPY (EGD) (N/A) ESOPHAGEAL DILATION  Patient location during evaluation: PACU Anesthesia Type: General Level of consciousness: awake Pain management: pain level controlled Vital Signs Assessment: post-procedure vital signs reviewed and stable Respiratory status: spontaneous breathing, nonlabored ventilation and respiratory function stable Cardiovascular status: stable Postop Assessment: no signs of nausea or vomiting Anesthetic complications: no    Veda Canning

## 2017-04-21 NOTE — Transfer of Care (Signed)
Immediate Anesthesia Transfer of Care Note  Patient: Valerie Raymond  Procedure(s) Performed: Procedure(s): ESOPHAGOGASTRODUODENOSCOPY (EGD) (N/A) ESOPHAGEAL DILATION  Patient Location: PACU  Anesthesia Type: General  Level of Consciousness: awake, alert  and patient cooperative  Airway and Oxygen Therapy: Patient Spontanous Breathing and Patient connected to supplemental oxygen  Post-op Assessment: Post-op Vital signs reviewed, Patient's Cardiovascular Status Stable, Respiratory Function Stable, Patent Airway and No signs of Nausea or vomiting  Post-op Vital Signs: Reviewed and stable  Complications: No apparent anesthesia complications

## 2017-04-21 NOTE — Anesthesia Preprocedure Evaluation (Signed)
Anesthesia Evaluation  Patient identified by MRN, date of birth, ID band Patient awake    Reviewed: Allergy & Precautions, H&P , NPO status   Airway Mallampati: II  TM Distance: >3 FB     Dental  (+) Teeth Intact   Pulmonary neg pulmonary ROS,    breath sounds clear to auscultation       Cardiovascular negative cardio ROS   Rhythm:Regular Rate:Normal     Neuro/Psych    GI/Hepatic Dysphagia   Endo/Other  Vitamin D deficiency  Renal/GU      Musculoskeletal  (+) Arthritis , Osteopororis   Abdominal   Peds  Hematology   Anesthesia Other Findings   Reproductive/Obstetrics                            Anesthesia Physical Anesthesia Plan  ASA: II  Anesthesia Plan: General   Post-op Pain Management:    Induction:   PONV Risk Score and Plan:   Airway Management Planned: Natural Airway  Additional Equipment:   Intra-op Plan:   Post-operative Plan:   Informed Consent: I have reviewed the patients History and Physical, chart, labs and discussed the procedure including the risks, benefits and alternatives for the proposed anesthesia with the patient or authorized representative who has indicated his/her understanding and acceptance.     Plan Discussed with: CRNA  Anesthesia Plan Comments:         Anesthesia Quick Evaluation

## 2017-04-22 ENCOUNTER — Encounter: Payer: Self-pay | Admitting: Gastroenterology

## 2017-05-08 DIAGNOSIS — M5136 Other intervertebral disc degeneration, lumbar region: Secondary | ICD-10-CM | POA: Diagnosis not present

## 2017-05-08 DIAGNOSIS — M5416 Radiculopathy, lumbar region: Secondary | ICD-10-CM | POA: Diagnosis not present

## 2017-06-05 DIAGNOSIS — M79605 Pain in left leg: Secondary | ICD-10-CM | POA: Insufficient documentation

## 2017-06-05 DIAGNOSIS — M79604 Pain in right leg: Secondary | ICD-10-CM | POA: Insufficient documentation

## 2017-06-05 DIAGNOSIS — M545 Low back pain: Secondary | ICD-10-CM | POA: Insufficient documentation

## 2017-06-12 DIAGNOSIS — M5441 Lumbago with sciatica, right side: Secondary | ICD-10-CM | POA: Diagnosis not present

## 2017-06-12 DIAGNOSIS — M6281 Muscle weakness (generalized): Secondary | ICD-10-CM | POA: Diagnosis not present

## 2017-06-12 DIAGNOSIS — G8929 Other chronic pain: Secondary | ICD-10-CM | POA: Diagnosis not present

## 2017-06-12 DIAGNOSIS — M5442 Lumbago with sciatica, left side: Secondary | ICD-10-CM | POA: Diagnosis not present

## 2017-06-19 DIAGNOSIS — M5442 Lumbago with sciatica, left side: Secondary | ICD-10-CM | POA: Diagnosis not present

## 2017-06-19 DIAGNOSIS — M5416 Radiculopathy, lumbar region: Secondary | ICD-10-CM | POA: Diagnosis not present

## 2017-06-19 DIAGNOSIS — M5441 Lumbago with sciatica, right side: Secondary | ICD-10-CM | POA: Diagnosis not present

## 2017-06-19 DIAGNOSIS — G8929 Other chronic pain: Secondary | ICD-10-CM | POA: Diagnosis not present

## 2017-06-24 DIAGNOSIS — M25552 Pain in left hip: Secondary | ICD-10-CM | POA: Diagnosis not present

## 2017-06-24 DIAGNOSIS — M5441 Lumbago with sciatica, right side: Secondary | ICD-10-CM | POA: Diagnosis not present

## 2017-06-24 DIAGNOSIS — M25551 Pain in right hip: Secondary | ICD-10-CM | POA: Diagnosis not present

## 2017-06-24 DIAGNOSIS — M545 Low back pain: Secondary | ICD-10-CM | POA: Diagnosis not present

## 2017-06-24 DIAGNOSIS — M5442 Lumbago with sciatica, left side: Secondary | ICD-10-CM | POA: Diagnosis not present

## 2017-06-26 DIAGNOSIS — G8929 Other chronic pain: Secondary | ICD-10-CM | POA: Diagnosis not present

## 2017-06-26 DIAGNOSIS — M5441 Lumbago with sciatica, right side: Secondary | ICD-10-CM | POA: Diagnosis not present

## 2017-06-26 DIAGNOSIS — M5442 Lumbago with sciatica, left side: Secondary | ICD-10-CM | POA: Diagnosis not present

## 2017-07-01 DIAGNOSIS — M25552 Pain in left hip: Secondary | ICD-10-CM | POA: Diagnosis not present

## 2017-07-03 ENCOUNTER — Other Ambulatory Visit: Payer: Self-pay

## 2017-07-03 ENCOUNTER — Encounter: Payer: Self-pay | Admitting: *Deleted

## 2017-07-03 DIAGNOSIS — G8929 Other chronic pain: Secondary | ICD-10-CM | POA: Diagnosis not present

## 2017-07-03 DIAGNOSIS — M5441 Lumbago with sciatica, right side: Secondary | ICD-10-CM | POA: Diagnosis not present

## 2017-07-03 DIAGNOSIS — M5442 Lumbago with sciatica, left side: Secondary | ICD-10-CM | POA: Diagnosis not present

## 2017-07-03 DIAGNOSIS — R1310 Dysphagia, unspecified: Secondary | ICD-10-CM

## 2017-07-03 DIAGNOSIS — R1319 Other dysphagia: Secondary | ICD-10-CM

## 2017-07-04 NOTE — Discharge Instructions (Signed)
General Anesthesia, Adult, Care After °These instructions provide you with information about caring for yourself after your procedure. Your health care provider may also give you more specific instructions. Your treatment has been planned according to current medical practices, but problems sometimes occur. Call your health care provider if you have any problems or questions after your procedure. °What can I expect after the procedure? °After the procedure, it is common to have: °· Vomiting. °· A sore throat. °· Mental slowness. ° °It is common to feel: °· Nauseous. °· Cold or shivery. °· Sleepy. °· Tired. °· Sore or achy, even in parts of your body where you did not have surgery. ° °Follow these instructions at home: °For at least 24 hours after the procedure: °· Do not: °? Participate in activities where you could fall or become injured. °? Drive. °? Use heavy machinery. °? Drink alcohol. °? Take sleeping pills or medicines that cause drowsiness. °? Make important decisions or sign legal documents. °? Take care of children on your own. °· Rest. °Eating and drinking °· If you vomit, drink water, juice, or soup when you can drink without vomiting. °· Drink enough fluid to keep your urine clear or pale yellow. °· Make sure you have little or no nausea before eating solid foods. °· Follow the diet recommended by your health care provider. °General instructions °· Have a responsible adult stay with you until you are awake and alert. °· Return to your normal activities as told by your health care provider. Ask your health care provider what activities are safe for you. °· Take over-the-counter and prescription medicines only as told by your health care provider. °· If you smoke, do not smoke without supervision. °· Keep all follow-up visits as told by your health care provider. This is important. °Contact a health care provider if: °· You continue to have nausea or vomiting at home, and medicines are not helpful. °· You  cannot drink fluids or start eating again. °· You cannot urinate after 8-12 hours. °· You develop a skin rash. °· You have fever. °· You have increasing redness at the site of your procedure. °Get help right away if: °· You have difficulty breathing. °· You have chest pain. °· You have unexpected bleeding. °· You feel that you are having a life-threatening or urgent problem. °This information is not intended to replace advice given to you by your health care provider. Make sure you discuss any questions you have with your health care provider. °Document Released: 12/09/2000 Document Revised: 02/05/2016 Document Reviewed: 08/17/2015 °Elsevier Interactive Patient Education © 2018 [employer]. ° °

## 2017-07-07 ENCOUNTER — Ambulatory Visit: Payer: Medicare Other | Admitting: Anesthesiology

## 2017-07-07 ENCOUNTER — Encounter
Admission: RE | Disposition: A | Discharge status: Home / Self Care | Payer: Self-pay | Source: Ambulatory Visit | Attending: Gastroenterology

## 2017-07-07 ENCOUNTER — Ambulatory Visit
Admission: RE | Admit: 2017-07-07 | Discharge: 2017-07-07 | Disposition: A | Discharge status: Home / Self Care | Payer: Medicare Other | Source: Ambulatory Visit | Attending: Gastroenterology | Admitting: Gastroenterology

## 2017-07-07 DIAGNOSIS — M1610 Unilateral primary osteoarthritis, unspecified hip: Secondary | ICD-10-CM | POA: Insufficient documentation

## 2017-07-07 DIAGNOSIS — M19042 Primary osteoarthritis, left hand: Secondary | ICD-10-CM | POA: Insufficient documentation

## 2017-07-07 DIAGNOSIS — M5136 Other intervertebral disc degeneration, lumbar region: Secondary | ICD-10-CM | POA: Diagnosis not present

## 2017-07-07 DIAGNOSIS — M810 Age-related osteoporosis without current pathological fracture: Secondary | ICD-10-CM | POA: Diagnosis not present

## 2017-07-07 DIAGNOSIS — M1710 Unilateral primary osteoarthritis, unspecified knee: Secondary | ICD-10-CM | POA: Insufficient documentation

## 2017-07-07 DIAGNOSIS — Z800 Family history of malignant neoplasm of digestive organs: Secondary | ICD-10-CM | POA: Diagnosis not present

## 2017-07-07 DIAGNOSIS — R1310 Dysphagia, unspecified: Secondary | ICD-10-CM | POA: Diagnosis not present

## 2017-07-07 DIAGNOSIS — E559 Vitamin D deficiency, unspecified: Secondary | ICD-10-CM | POA: Diagnosis not present

## 2017-07-07 DIAGNOSIS — K222 Esophageal obstruction: Secondary | ICD-10-CM

## 2017-07-07 DIAGNOSIS — Z90710 Acquired absence of both cervix and uterus: Secondary | ICD-10-CM | POA: Insufficient documentation

## 2017-07-07 DIAGNOSIS — Z79899 Other long term (current) drug therapy: Secondary | ICD-10-CM | POA: Insufficient documentation

## 2017-07-07 DIAGNOSIS — M19041 Primary osteoarthritis, right hand: Secondary | ICD-10-CM | POA: Diagnosis not present

## 2017-07-07 HISTORY — DX: Other intervertebral disc degeneration, lumbar region without mention of lumbar back pain or lower extremity pain: M51.369

## 2017-07-07 HISTORY — DX: Other intervertebral disc degeneration, lumbar region: M51.36

## 2017-07-07 HISTORY — PX: ESOPHAGOGASTRODUODENOSCOPY (EGD) WITH PROPOFOL: SHX5813

## 2017-07-07 HISTORY — PX: ESOPHAGEAL DILATION: SHX303

## 2017-07-07 SURGERY — ESOPHAGOGASTRODUODENOSCOPY (EGD) WITH PROPOFOL
Anesthesia: General | Wound class: Clean Contaminated

## 2017-07-07 MED ORDER — LACTATED RINGERS IV SOLN
10.0000 mL/h | INTRAVENOUS | Status: DC
  Administered 2017-07-07: 10 mL/h via INTRAVENOUS

## 2017-07-07 MED ORDER — LIDOCAINE HCL (CARDIAC) 20 MG/ML IV SOLN
INTRAVENOUS | Status: DC | PRN
  Administered 2017-07-07: 50 mg via INTRAVENOUS

## 2017-07-07 MED ORDER — STERILE WATER FOR IRRIGATION IR SOLN
Status: DC | PRN
  Administered 2017-07-07: 09:00:00

## 2017-07-07 MED ORDER — ACETAMINOPHEN 160 MG/5ML PO SOLN: 325.0000 mg | ORAL | Status: DC | PRN

## 2017-07-07 MED ORDER — PROPOFOL 10 MG/ML IV BOLUS
INTRAVENOUS | Status: DC | PRN
  Administered 2017-07-07: 30 mg via INTRAVENOUS
  Administered 2017-07-07: 20 mg via INTRAVENOUS
  Administered 2017-07-07: 80 mg via INTRAVENOUS
  Administered 2017-07-07: 20 mg via INTRAVENOUS

## 2017-07-07 MED ORDER — GLYCOPYRROLATE 0.2 MG/ML IJ SOLN
INTRAMUSCULAR | Status: DC | PRN
  Administered 2017-07-07: 0.2 mg via INTRAVENOUS

## 2017-07-07 MED ORDER — ONDANSETRON HCL 4 MG/2ML IJ SOLN: 4.0000 mg | Freq: Once | INTRAMUSCULAR | Status: DC | PRN

## 2017-07-07 MED ORDER — ACETAMINOPHEN 325 MG PO TABS: 325.0000 mg | ORAL_TABLET | ORAL | Status: DC | PRN

## 2017-07-07 MED ORDER — PANTOPRAZOLE SODIUM 40 MG PO TBEC: 40.0000 mg | DELAYED_RELEASE_TABLET | Freq: Every day | ORAL | 11 refills | Status: DC

## 2017-07-07 SURGICAL SUPPLY — 32 items
BALLN DILATOR 10-12 8 (BALLOONS)
BALLN DILATOR 12-15 8 (BALLOONS) ×4
BALLN DILATOR 15-18 8 (BALLOONS)
BALLN DILATOR CRE 0-12 8 (BALLOONS)
BALLN DILATOR ESOPH 8 10 CRE (MISCELLANEOUS) IMPLANT
BALLOON DILATOR 12-15 8 (BALLOONS) ×2 IMPLANT
BALLOON DILATOR 15-18 8 (BALLOONS) IMPLANT
BALLOON DILATOR CRE 0-12 8 (BALLOONS) IMPLANT
BLOCK BITE 60FR ADLT L/F GRN (MISCELLANEOUS) ×4 IMPLANT
CANISTER SUCT 1200ML W/VALVE (MISCELLANEOUS) ×4 IMPLANT
CLIP HMST 235XBRD CATH ROT (MISCELLANEOUS) IMPLANT
CLIP RESOLUTION 360 11X235 (MISCELLANEOUS)
FCP ESCP3.2XJMB 240X2.8X (MISCELLANEOUS)
FORCEPS BIOP RAD 4 LRG CAP 4 (CUTTING FORCEPS) IMPLANT
FORCEPS BIOP RJ4 240 W/NDL (MISCELLANEOUS)
FORCEPS ESCP3.2XJMB 240X2.8X (MISCELLANEOUS) IMPLANT
GOWN CVR UNV OPN BCK APRN NK (MISCELLANEOUS) ×4 IMPLANT
GOWN ISOL THUMB LOOP REG UNIV (MISCELLANEOUS) ×4
INJECTOR VARIJECT VIN23 (MISCELLANEOUS) IMPLANT
KIT DEFENDO VALVE AND CONN (KITS) IMPLANT
KIT ENDO PROCEDURE OLY (KITS) ×4 IMPLANT
MARKER SPOT ENDO TATTOO 5ML (MISCELLANEOUS) IMPLANT
PAD GROUND ADULT SPLIT (MISCELLANEOUS) IMPLANT
RETRIEVER NET PLAT FOOD (MISCELLANEOUS) IMPLANT
SNARE SHORT THROW 13M SML OVAL (MISCELLANEOUS) IMPLANT
SNARE SHORT THROW 30M LRG OVAL (MISCELLANEOUS) IMPLANT
SPOT EX ENDOSCOPIC TATTOO (MISCELLANEOUS)
SYR INFLATION 60ML (SYRINGE) ×4 IMPLANT
TRAP ETRAP POLY (MISCELLANEOUS) IMPLANT
VARIJECT INJECTOR VIN23 (MISCELLANEOUS)
WATER STERILE IRR 250ML POUR (IV SOLUTION) ×4 IMPLANT
WIRE CRE 18-20MM 8CM F G (MISCELLANEOUS) IMPLANT

## 2017-07-07 NOTE — Op Note (Signed)
[facility] Gastroenterology Patient Name: Valerie Raymond Procedure Date: 07/07/2017 9:05 AM MRN: [id number] Account #: [id number] Date of Birth: 08-24-1951 Admit Type: Outpatient Age: 66 Room: [facility] OR ROOM 01 Gender: Female Note Status: Finalized Procedure:            Upper GI endoscopy Indications:          Dysphagia Providers:            Lucilla Lame MD, MD Referring MD:         Halina Maidens, MD (Referring MD) Medicines:            Propofol per Anesthesia Complications:        No immediate complications. Procedure:            Pre-Anesthesia Assessment:                       - Prior to the procedure, a History and Physical was                        performed, and patient medications and allergies were                        reviewed. The patient's tolerance of previous                        anesthesia was also reviewed. The risks and benefits of                        the procedure and the sedation options and risks were                        discussed with the patient. All questions were                        answered, and informed consent was obtained. Prior                        Anticoagulants: The patient has taken no previous                        anticoagulant or antiplatelet agents. ASA Grade                        Assessment: II - A patient with mild systemic disease.                        After reviewing the risks and benefits, the patient was                        deemed in satisfactory condition to undergo the                        procedure.                       After obtaining informed consent, the endoscope was                        passed under direct vision. Throughout the procedure,  the patient's blood pressure, pulse, and oxygen                        saturations were monitored continuously. The Olympus                        [id number]-HQ190 Endoscope (S#. [id number]) was introduced                        through the  mouth, and advanced to the second part of                        duodenum. The upper GI endoscopy was accomplished                        without difficulty. The patient tolerated the procedure                        well. Findings:      One moderate benign-appearing, intrinsic stenosis was found at the       gastroesophageal junction. And was traversed. A TTS dilator was passed       through the scope. Dilation with a 12-13.5-15 mm balloon dilator was       performed to 15 mm. The dilation site was examined following endoscope       reinsertion and showed moderate improvement in luminal narrowing.      The stomach was normal.      The examined duodenum was normal. Impression:           - Benign-appearing esophageal stenosis. Dilated.                       - Normal stomach.                       - Normal examined duodenum.                       - No specimens collected. Recommendation:       - Discharge patient to home.                       - Resume previous diet.                       - Continue present medications.                       - Repeat upper endoscopy in 3 weeks for retreatment. Procedure Code(s):    --- Professional ---                       [phone], Esophagogastroduodenoscopy, flexible, transoral;                        with transendoscopic balloon dilation of esophagus                        (less than 30 mm diameter) Diagnosis Code(s):    --- Professional ---                       R13.10, Dysphagia, unspecified  K22.2, Esophageal obstruction CPT copyright 2016 American Medical Association. All rights reserved. The codes documented in this report are preliminary and upon coder review may  be revised to meet current compliance requirements. Lucilla Lame MD, MD 07/07/2017 9:26:38 AM This report has been signed electronically. Number of Addenda: 0 Note Initiated On: 07/07/2017 9:05 AM      [facility]

## 2017-07-07 NOTE — Transfer of Care (Signed)
Immediate Anesthesia Transfer of Care Note  Patient: Valerie Raymond  Procedure(s) Performed: ESOPHAGOGASTRODUODENOSCOPY (EGD) WITH PROPOFOL (N/A ) ESOPHAGEAL DILATION  Patient Location: PACU  Anesthesia Type: General  Level of Consciousness: awake, alert  and patient cooperative  Airway and Oxygen Therapy: Patient Spontanous Breathing and Patient connected to supplemental oxygen  Post-op Assessment: Post-op Vital signs reviewed, Patient's Cardiovascular Status Stable, Respiratory Function Stable, Patent Airway and No signs of Nausea or vomiting  Post-op Vital Signs: Reviewed and stable  Complications: No apparent anesthesia complications

## 2017-07-07 NOTE — Anesthesia Postprocedure Evaluation (Signed)
Anesthesia Post Note  Patient: Valerie Raymond  Procedure(s) Performed: ESOPHAGOGASTRODUODENOSCOPY (EGD) WITH PROPOFOL (N/A ) ESOPHAGEAL DILATION  Patient location during evaluation: PACU Anesthesia Type: General Level of consciousness: awake Pain management: pain level controlled Vital Signs Assessment: post-procedure vital signs reviewed and stable Respiratory status: respiratory function stable Cardiovascular status: stable Postop Assessment: no signs of nausea or vomiting Anesthetic complications: no    Veda Canning

## 2017-07-07 NOTE — Anesthesia Preprocedure Evaluation (Signed)
Anesthesia Evaluation  Patient identified by MRN, date of birth, ID band Patient awake    Reviewed: Allergy & Precautions, H&P , NPO status , Patient's Chart, lab work & pertinent test results  Airway Mallampati: II  TM Distance: >3 FB     Dental  (+) Teeth Intact   Pulmonary neg pulmonary ROS,    breath sounds clear to auscultation       Cardiovascular negative cardio ROS   Rhythm:Regular Rate:Normal     Neuro/Psych    GI/Hepatic Dysphagia   Endo/Other  Vitamin D deficiency  Renal/GU      Musculoskeletal  (+) Arthritis , Osteopororis   Abdominal   Peds  Hematology   Anesthesia Other Findings   Reproductive/Obstetrics                             Anesthesia Physical  Anesthesia Plan  ASA: II  Anesthesia Plan: General   Post-op Pain Management:    Induction:   PONV Risk Score and Plan:   Airway Management Planned: Natural Airway and Nasal Cannula  Additional Equipment:   Intra-op Plan:   Post-operative Plan:   Informed Consent: I have reviewed the patients History and Physical, chart, labs and discussed the procedure including the risks, benefits and alternatives for the proposed anesthesia with the patient or authorized representative who has indicated his/her understanding and acceptance.     Plan Discussed with: CRNA  Anesthesia Plan Comments:         Anesthesia Quick Evaluation

## 2017-07-07 NOTE — Anesthesia Procedure Notes (Signed)
Performed by: Tarynn Garling Pre-anesthesia Checklist: Patient identified, Emergency Drugs available, Suction available, Timeout performed and Patient being monitored Patient Re-evaluated:Patient Re-evaluated prior to induction Oxygen Delivery Method: Nasal cannula Placement Confirmation: positive ETCO2       

## 2017-07-07 NOTE — H&P (Signed)
Lucilla Lame, MD [facility] [address] [place], [place] [address] Phone:[phone] Fax : [phone]  Primary Care Physician:  [name], MD Primary Gastroenterologist:  Dr. Allen Norris  Pre-Procedure History & Physical: HPI:  Valerie Raymond is a 66 y.o. female is here for an endoscopy.   Past Medical History:  Diagnosis Date  . Arthritis    knee,hip,hands  . Degenerative disc disease, lumbar   . DJD (degenerative joint disease)   . Dysphagia    with solids and regurgitation  . Endometriosis    h/o  . Heartburn   . Joint pain   . Osteoporosis    hip  . Rash    skin rash and itching  . Vitamin D deficiency     Past Surgical History:  Procedure Laterality Date  . ABDOMINAL HYSTERECTOMY  1992  . COLONOSCOPY  2006  . COLONOSCOPY N/A 01/23/2015   Procedure: COLONOSCOPY;  Surgeon: Lucilla Lame, MD;  Location: [place];  Service: Gastroenterology;  Laterality: N/A;  cecum- 5102  . ESOPHAGEAL DILATION  04/21/2017   Procedure: ESOPHAGEAL DILATION;  Surgeon: Lucilla Lame, MD;  Location: [place];  Service: Endoscopy;;  . ESOPHAGOGASTRODUODENOSCOPY N/A 04/21/2017   Procedure: ESOPHAGOGASTRODUODENOSCOPY (EGD);  Surgeon: Lucilla Lame, MD;  Location: [place];  Service: Endoscopy;  Laterality: N/A;  . SALPINGOOPHORECTOMY Bilateral     Prior to Admission medications   Medication Sig Start Date End Date Taking? Authorizing Provider  alendronate (FOSAMAX) 70 MG tablet TAKE 1 TABLET BY MOUTH ONCE A WEEK WITH A GLASS OF WATER ON A EMPTY STOMACH AND REMAIN UPRIGHT WITHOUT EATING FOR 30 MINUTES. 12/18/16  Yes [name], MD  Calcium Carbonate-Vit D-Min (CALCIUM 1200 PO) Take by mouth.   Yes [provider]  Cholecalciferol (VITAMIN D3) 1000 UNITS CAPS Take by mouth. am   Yes [provider]  conjugated estrogens (PREMARIN) vaginal cream Place 5.85 Applicatorfuls vaginally 2 (two) times a week. 1/2 gram twice weekly 04/03/17  Yes  Defrancesco, Alanda Slim, MD  gabapentin (NEURONTIN) 300 MG capsule Take 300 mg by mouth 2 (two) times daily.   Yes [provider]  meloxicam (MOBIC) 15 MG tablet Take 1 tablet (15 mg total) by mouth daily. 03/17/17  Yes [name], MD    Allergies as of 07/03/2017 - Review Complete 07/03/2017  Allergen Reaction Noted  . Lodine [etodolac] Diarrhea and Nausea And Vomiting 01/18/2015    Family History  Problem Relation Age of Onset  . Pancreatic cancer Mother   . Breast cancer Sister 34  . Prostate cancer Father   . Breast cancer Maternal Aunt   . Breast cancer Cousin        pat cousin  . Breast cancer Sister 70  . Colon cancer Paternal Uncle   . Ovarian cancer Neg Hx   . Diabetes Neg Hx     Social History   Social History  . Marital status: Single    Spouse name: N/A  . Number of children: N/A  . Years of education: N/A   Occupational History  . Not on file.   Social History Main Topics  . Smoking status: Never Smoker  . Smokeless tobacco: Never Used  . Alcohol use No  . Drug use: No  . Sexual activity: Not Currently    Birth control/ protection: Surgical   Other Topics Concern  . Not on file   Social History Narrative  . No narrative on file    Review  of Systems: See HPI, otherwise negative ROS  Physical Exam: BP 137/77   Pulse 64   Temp 98.1 F (36.7 C) (Tympanic)   Resp 16   Ht 5\' 6"  (1.676 m)   Wt 142 lb (64.4 kg)   SpO2 99%   BMI 22.92 kg/m  General:   Alert,  pleasant and cooperative in NAD Head:  Normocephalic and atraumatic. Neck:  Supple; no masses or thyromegaly. Lungs:  Clear throughout to auscultation.    Heart:  Regular rate and rhythm. Abdomen:  Soft, nontender and nondistended. Normal bowel sounds, without guarding, and without rebound.   Neurologic:  Alert and  oriented x4;  grossly normal neurologically.  Impression/Plan: Valerie Raymond is here for an endoscopy to be performed for dysphagia  Risks, benefits,  limitations, and alternatives regarding  endoscopy have been reviewed with the patient.  Questions have been answered.  All parties agreeable.   Lucilla Lame, MD  07/07/2017, 8:24 AM

## 2017-07-08 ENCOUNTER — Encounter: Payer: Self-pay | Admitting: Gastroenterology

## 2017-07-08 ENCOUNTER — Telehealth: Payer: Self-pay | Admitting: Gastroenterology

## 2017-07-08 DIAGNOSIS — G8929 Other chronic pain: Secondary | ICD-10-CM | POA: Diagnosis not present

## 2017-07-08 DIAGNOSIS — M5441 Lumbago with sciatica, right side: Secondary | ICD-10-CM | POA: Diagnosis not present

## 2017-07-08 DIAGNOSIS — M5442 Lumbago with sciatica, left side: Secondary | ICD-10-CM | POA: Diagnosis not present

## 2017-07-08 NOTE — Telephone Encounter (Signed)
Patient needs to r/s another EGD in 3 weeks.

## 2017-07-09 ENCOUNTER — Other Ambulatory Visit: Payer: Self-pay

## 2017-07-09 DIAGNOSIS — K222 Esophageal obstruction: Secondary | ICD-10-CM

## 2017-07-15 DIAGNOSIS — G8929 Other chronic pain: Secondary | ICD-10-CM | POA: Diagnosis not present

## 2017-07-15 DIAGNOSIS — M5441 Lumbago with sciatica, right side: Secondary | ICD-10-CM | POA: Diagnosis not present

## 2017-07-15 DIAGNOSIS — M5442 Lumbago with sciatica, left side: Secondary | ICD-10-CM | POA: Diagnosis not present

## 2017-07-16 DIAGNOSIS — M5416 Radiculopathy, lumbar region: Secondary | ICD-10-CM | POA: Insufficient documentation

## 2017-07-17 DIAGNOSIS — M545 Low back pain: Secondary | ICD-10-CM | POA: Diagnosis not present

## 2017-07-17 DIAGNOSIS — G8929 Other chronic pain: Secondary | ICD-10-CM | POA: Diagnosis not present

## 2017-07-21 DIAGNOSIS — H2513 Age-related nuclear cataract, bilateral: Secondary | ICD-10-CM | POA: Diagnosis not present

## 2017-07-24 ENCOUNTER — Other Ambulatory Visit: Payer: Self-pay

## 2017-07-24 DIAGNOSIS — R1310 Dysphagia, unspecified: Secondary | ICD-10-CM

## 2017-07-28 ENCOUNTER — Encounter
Admission: RE | Disposition: A | Discharge status: Home / Self Care | Payer: Self-pay | Source: Ambulatory Visit | Attending: Gastroenterology

## 2017-07-28 ENCOUNTER — Ambulatory Visit
Admission: RE | Admit: 2017-07-28 | Discharge: 2017-07-28 | Disposition: A | Discharge status: Home / Self Care | Payer: Medicare Other | Source: Ambulatory Visit | Attending: Gastroenterology | Admitting: Gastroenterology

## 2017-07-28 ENCOUNTER — Ambulatory Visit: Payer: Medicare Other | Admitting: Student in an Organized Health Care Education/Training Program

## 2017-07-28 ENCOUNTER — Encounter: Admission: RE | Payer: Self-pay | Source: Ambulatory Visit

## 2017-07-28 ENCOUNTER — Ambulatory Visit: Admission: RE | Admit: 2017-07-28 | Payer: Medicare Other | Source: Ambulatory Visit | Admitting: Gastroenterology

## 2017-07-28 DIAGNOSIS — Z9079 Acquired absence of other genital organ(s): Secondary | ICD-10-CM | POA: Diagnosis not present

## 2017-07-28 DIAGNOSIS — Z79890 Hormone replacement therapy (postmenopausal): Secondary | ICD-10-CM | POA: Insufficient documentation

## 2017-07-28 DIAGNOSIS — Z888 Allergy status to other drugs, medicaments and biological substances status: Secondary | ICD-10-CM | POA: Insufficient documentation

## 2017-07-28 DIAGNOSIS — Z79899 Other long term (current) drug therapy: Secondary | ICD-10-CM | POA: Diagnosis not present

## 2017-07-28 DIAGNOSIS — Z8042 Family history of malignant neoplasm of prostate: Secondary | ICD-10-CM | POA: Insufficient documentation

## 2017-07-28 DIAGNOSIS — K219 Gastro-esophageal reflux disease without esophagitis: Secondary | ICD-10-CM | POA: Diagnosis not present

## 2017-07-28 DIAGNOSIS — M5136 Other intervertebral disc degeneration, lumbar region: Secondary | ICD-10-CM | POA: Diagnosis not present

## 2017-07-28 DIAGNOSIS — E559 Vitamin D deficiency, unspecified: Secondary | ICD-10-CM | POA: Diagnosis not present

## 2017-07-28 DIAGNOSIS — M19042 Primary osteoarthritis, left hand: Secondary | ICD-10-CM | POA: Insufficient documentation

## 2017-07-28 DIAGNOSIS — Z800 Family history of malignant neoplasm of digestive organs: Secondary | ICD-10-CM | POA: Insufficient documentation

## 2017-07-28 DIAGNOSIS — R1310 Dysphagia, unspecified: Secondary | ICD-10-CM

## 2017-07-28 DIAGNOSIS — Z803 Family history of malignant neoplasm of breast: Secondary | ICD-10-CM | POA: Diagnosis not present

## 2017-07-28 DIAGNOSIS — Z791 Long term (current) use of non-steroidal anti-inflammatories (NSAID): Secondary | ICD-10-CM | POA: Insufficient documentation

## 2017-07-28 DIAGNOSIS — Z90710 Acquired absence of both cervix and uterus: Secondary | ICD-10-CM | POA: Insufficient documentation

## 2017-07-28 DIAGNOSIS — M19041 Primary osteoarthritis, right hand: Secondary | ICD-10-CM | POA: Insufficient documentation

## 2017-07-28 DIAGNOSIS — K222 Esophageal obstruction: Secondary | ICD-10-CM | POA: Diagnosis not present

## 2017-07-28 DIAGNOSIS — Z90722 Acquired absence of ovaries, bilateral: Secondary | ICD-10-CM | POA: Insufficient documentation

## 2017-07-28 HISTORY — PX: ESOPHAGOGASTRODUODENOSCOPY (EGD) WITH PROPOFOL: SHX5813

## 2017-07-28 HISTORY — PX: ESOPHAGEAL DILATION: SHX303

## 2017-07-28 HISTORY — DX: Gastro-esophageal reflux disease without esophagitis: K21.9

## 2017-07-28 SURGERY — ESOPHAGOGASTRODUODENOSCOPY (EGD) WITH PROPOFOL
Anesthesia: General | Site: Mouth | Wound class: Clean Contaminated

## 2017-07-28 SURGERY — ESOPHAGOGASTRODUODENOSCOPY (EGD) WITH PROPOFOL
Anesthesia: General

## 2017-07-28 MED ORDER — LIDOCAINE HCL (CARDIAC) 20 MG/ML IV SOLN
INTRAVENOUS | Status: DC | PRN
  Administered 2017-07-28: 50 mg via INTRAVENOUS

## 2017-07-28 MED ORDER — PROPOFOL 10 MG/ML IV BOLUS
INTRAVENOUS | Status: DC | PRN
  Administered 2017-07-28: 100 mg via INTRAVENOUS
  Administered 2017-07-28: 40 mg via INTRAVENOUS

## 2017-07-28 MED ORDER — STERILE WATER FOR IRRIGATION IR SOLN
Status: DC | PRN
  Administered 2017-07-28: 11:00:00

## 2017-07-28 MED ORDER — GLYCOPYRROLATE 0.2 MG/ML IJ SOLN
INTRAMUSCULAR | Status: DC | PRN
  Administered 2017-07-28: 0.1 mg via INTRAVENOUS

## 2017-07-28 MED ORDER — LACTATED RINGERS IV SOLN
1000.0000 mL | INTRAVENOUS | Status: DC
  Administered 2017-07-28: 1000 mL via INTRAVENOUS

## 2017-07-28 SURGICAL SUPPLY — 32 items
BALLN DILATOR 10-12 8 (BALLOONS)
BALLN DILATOR 12-15 8 (BALLOONS)
BALLN DILATOR 15-18 8 (BALLOONS) ×3
BALLN DILATOR CRE 0-12 8 (BALLOONS)
BALLN DILATOR ESOPH 8 10 CRE (MISCELLANEOUS) IMPLANT
BALLOON DILATOR 12-15 8 (BALLOONS) IMPLANT
BALLOON DILATOR 15-18 8 (BALLOONS) ×2 IMPLANT
BALLOON DILATOR CRE 0-12 8 (BALLOONS) IMPLANT
BLOCK BITE 60FR ADLT L/F GRN (MISCELLANEOUS) ×3 IMPLANT
CANISTER SUCT 1200ML W/VALVE (MISCELLANEOUS) ×3 IMPLANT
CLIP HMST 235XBRD CATH ROT (MISCELLANEOUS) IMPLANT
CLIP RESOLUTION 360 11X235 (MISCELLANEOUS)
FCP ESCP3.2XJMB 240X2.8X (MISCELLANEOUS)
FORCEPS BIOP RAD 4 LRG CAP 4 (CUTTING FORCEPS) IMPLANT
FORCEPS BIOP RJ4 240 W/NDL (MISCELLANEOUS)
FORCEPS ESCP3.2XJMB 240X2.8X (MISCELLANEOUS) IMPLANT
GOWN CVR UNV OPN BCK APRN NK (MISCELLANEOUS) ×4 IMPLANT
GOWN ISOL THUMB LOOP REG UNIV (MISCELLANEOUS) ×2
INJECTOR VARIJECT VIN23 (MISCELLANEOUS) IMPLANT
KIT DEFENDO VALVE AND CONN (KITS) IMPLANT
KIT ENDO PROCEDURE OLY (KITS) ×3 IMPLANT
MARKER SPOT ENDO TATTOO 5ML (MISCELLANEOUS) IMPLANT
PAD GROUND ADULT SPLIT (MISCELLANEOUS) IMPLANT
RETRIEVER NET PLAT FOOD (MISCELLANEOUS) IMPLANT
SNARE SHORT THROW 13M SML OVAL (MISCELLANEOUS) IMPLANT
SNARE SHORT THROW 30M LRG OVAL (MISCELLANEOUS) IMPLANT
SPOT EX ENDOSCOPIC TATTOO (MISCELLANEOUS)
SYR INFLATION 60ML (SYRINGE) ×3 IMPLANT
TRAP ETRAP POLY (MISCELLANEOUS) IMPLANT
VARIJECT INJECTOR VIN23 (MISCELLANEOUS)
WATER STERILE IRR 250ML POUR (IV SOLUTION) ×3 IMPLANT
WIRE CRE 18-20MM 8CM F G (MISCELLANEOUS) IMPLANT

## 2017-07-28 NOTE — Op Note (Signed)
[facility] Gastroenterology Patient Name: Valerie Raymond Procedure Date: 07/28/2017 10:37 AM MRN: [id number] Account #: [id number] Date of Birth: 21-Aug-1951 Admit Type: Outpatient Age: 66 Room: [facility] OR ROOM 01 Gender: Female Note Status: Finalized Procedure:            Upper GI endoscopy Indications:          Dysphagia Providers:            Lucilla Lame MD, MD Referring MD:         Halina Maidens, MD (Referring MD) Medicines:            Propofol per Anesthesia Complications:        No immediate complications. Procedure:            Pre-Anesthesia Assessment:                       - Prior to the procedure, a History and Physical was                        performed, and patient medications and allergies were                        reviewed. The patient's tolerance of previous                        anesthesia was also reviewed. The risks and benefits of                        the procedure and the sedation options and risks were                        discussed with the patient. All questions were                        answered, and informed consent was obtained. Prior                        Anticoagulants: The patient has taken no previous                        anticoagulant or antiplatelet agents. ASA Grade                        Assessment: II - A patient with mild systemic disease.                        After reviewing the risks and benefits, the patient was                        deemed in satisfactory condition to undergo the                        procedure.                       After obtaining informed consent, the endoscope was                        passed under direct vision. Throughout the procedure,  the patient's blood pressure, pulse, and oxygen                        saturations were monitored continuously. The Olympus                        190 Endoscope [id number]) was introduced through the                        mouth, and  advanced to the second part of duodenum. The                        upper GI endoscopy was accomplished without difficulty.                        The patient tolerated the procedure well. Findings:      One mild benign-appearing, intrinsic stenosis was found at the       gastroesophageal junction. And was traversed. A TTS dilator was passed       through the scope. Dilation with a 15-16.5-18 mm balloon dilator was       performed to 18 mm. The dilation site was examined following endoscope       reinsertion and showed complete resolution of luminal narrowing.      The stomach was normal.      The examined duodenum was normal. Impression:           - Benign-appearing esophageal stenosis. Dilated.                       - Normal stomach.                       - Normal examined duodenum.                       - No specimens collected. Recommendation:       - Discharge patient to home.                       - Resume previous diet.                       - Continue present medications. Procedure Code(s):    --- Professional ---                       [phone], Esophagogastroduodenoscopy, flexible, transoral;                        with transendoscopic balloon dilation of esophagus                        (less than 30 mm diameter) Diagnosis Code(s):    --- Professional ---                       R13.10, Dysphagia, unspecified                       K22.2, Esophageal obstruction CPT copyright 2016 American Medical Association. All rights reserved. The codes documented in this report are preliminary and upon coder review may  be revised to meet current compliance requirements. Lucilla Lame MD, MD 07/28/2017 10:46:09 AM  This report has been signed electronically. Number of Addenda: 0 Note Initiated On: 07/28/2017 10:37 AM      [facility]

## 2017-07-28 NOTE — Anesthesia Postprocedure Evaluation (Signed)
Anesthesia Post Note  Patient: Valerie Raymond  Procedure(s) Performed: ESOPHAGOGASTRODUODENOSCOPY (EGD) WITH PROPOFOL (N/A Mouth) ESOPHAGEAL DILATION (N/A Esophagus)  Patient location during evaluation: PACU Anesthesia Type: General Level of consciousness: awake Pain management: pain level controlled Vital Signs Assessment: post-procedure vital signs reviewed and stable Respiratory status: spontaneous breathing Cardiovascular status: blood pressure returned to baseline Postop Assessment: no headache Anesthetic complications: no    Lavonna Monarch

## 2017-07-28 NOTE — Transfer of Care (Signed)
Immediate Anesthesia Transfer of Care Note  Patient: Valerie Raymond  Procedure(s) Performed: ESOPHAGOGASTRODUODENOSCOPY (EGD) WITH PROPOFOL (N/A Mouth) ESOPHAGEAL DILATION (N/A Esophagus)  Patient Location: PACU  Anesthesia Type: General  Level of Consciousness: awake, alert  and patient cooperative  Airway and Oxygen Therapy: Patient Spontanous Breathing and Patient connected to supplemental oxygen  Post-op Assessment: Post-op Vital signs reviewed, Patient's Cardiovascular Status Stable, Respiratory Function Stable, Patent Airway and No signs of Nausea or vomiting  Post-op Vital Signs: Reviewed and stable  Complications: No apparent anesthesia complications

## 2017-07-28 NOTE — Discharge Instructions (Signed)
General Anesthesia, Adult, Care After °These instructions provide you with information about caring for yourself after your procedure. Your health care provider may also give you more specific instructions. Your treatment has been planned according to current medical practices, but problems sometimes occur. Call your health care provider if you have any problems or questions after your procedure. °What can I expect after the procedure? °After the procedure, it is common to have: °· Vomiting. °· A sore throat. °· Mental slowness. ° °It is common to feel: °· Nauseous. °· Cold or shivery. °· Sleepy. °· Tired. °· Sore or achy, even in parts of your body where you did not have surgery. ° °Follow these instructions at home: °For at least 24 hours after the procedure: °· Do not: °? Participate in activities where you could fall or become injured. °? Drive. °? Use heavy machinery. °? Drink alcohol. °? Take sleeping pills or medicines that cause drowsiness. °? Make important decisions or sign legal documents. °? Take care of children on your own. °· Rest. °Eating and drinking °· If you vomit, drink water, juice, or soup when you can drink without vomiting. °· Drink enough fluid to keep your urine clear or pale yellow. °· Make sure you have little or no nausea before eating solid foods. °· Follow the diet recommended by your health care provider. °General instructions °· Have a responsible adult stay with you until you are awake and alert. °· Return to your normal activities as told by your health care provider. Ask your health care provider what activities are safe for you. °· Take over-the-counter and prescription medicines only as told by your health care provider. °· If you smoke, do not smoke without supervision. °· Keep all follow-up visits as told by your health care provider. This is important. °Contact a health care provider if: °· You continue to have nausea or vomiting at home, and medicines are not helpful. °· You  cannot drink fluids or start eating again. °· You cannot urinate after 8-12 hours. °· You develop a skin rash. °· You have fever. °· You have increasing redness at the site of your procedure. °Get help right away if: °· You have difficulty breathing. °· You have chest pain. °· You have unexpected bleeding. °· You feel that you are having a life-threatening or urgent problem. °This information is not intended to replace advice given to you by your health care provider. Make sure you discuss any questions you have with your health care provider. °Document Released: 12/09/2000 Document Revised: 02/05/2016 Document Reviewed: 08/17/2015 °Elsevier Interactive Patient Education © 2018 [employer]. ° °

## 2017-07-28 NOTE — Anesthesia Preprocedure Evaluation (Addendum)
Anesthesia Evaluation  Patient identified by MRN, date of birth, ID band Patient awake    Reviewed: Allergy & Precautions, NPO status , Patient's Chart, lab work & pertinent test results, reviewed documented beta blocker date and time   Airway Mallampati: II  TM Distance: >3 FB Neck ROM: Full    Dental no notable dental hx.    Pulmonary neg pulmonary ROS,    Pulmonary exam normal breath sounds clear to auscultation       Cardiovascular negative cardio ROS Normal cardiovascular exam Rhythm:Regular Rate:Normal     Neuro/Psych negative neurological ROS  negative psych ROS   GI/Hepatic Neg liver ROS, GERD  ,Diverticulosis   Endo/Other  negative endocrine ROS  Renal/GU negative Renal ROS Bladder dysfunction Female GU complaint     Musculoskeletal  (+) Arthritis ,   Abdominal Normal abdominal exam  (+)  Abdomen: soft.    Peds  Hematology negative hematology ROS (+)   Anesthesia Other Findings   Reproductive/Obstetrics negative OB ROS                            Anesthesia Physical Anesthesia Plan  ASA: II  Anesthesia Plan: General   Post-op Pain Management:    Induction: Intravenous  PONV Risk Score and Plan:   Airway Management Planned: Nasal Cannula and Natural Airway  Additional Equipment: None  Intra-op Plan:   Post-operative Plan:   Informed Consent: I have reviewed the patients History and Physical, chart, labs and discussed the procedure including the risks, benefits and alternatives for the proposed anesthesia with the patient or authorized representative who has indicated his/her understanding and acceptance.     Plan Discussed with: CRNA, Anesthesiologist and Surgeon  Anesthesia Plan Comments:         Anesthesia Quick Evaluation

## 2017-07-28 NOTE — Anesthesia Procedure Notes (Signed)
Date/Time: 07/28/2017 10:37 AM Performed by: Cameron Ali, CRNA Pre-anesthesia Checklist: Patient identified, Emergency Drugs available, Suction available, Timeout performed and Patient being monitored Patient Re-evaluated:Patient Re-evaluated prior to induction Oxygen Delivery Method: Nasal cannula Placement Confirmation: positive ETCO2

## 2017-07-28 NOTE — H&P (Signed)
Lucilla Lame, MD [facility] [address] [place], [place] [address] Phone:[phone] Fax : [phone]  Primary Care Physician:  [name], MD Primary Gastroenterologist:  Dr. Allen Norris  Pre-Procedure History & Physical: HPI:  Valerie Raymond is a 66 y.o. female is here for an endoscopy.   Past Medical History:  Diagnosis Date  . Arthritis    knee,hip,hands  . Degenerative disc disease, lumbar   . DJD (degenerative joint disease)   . Dysphagia    with solids and regurgitation  . Endometriosis    h/o  . GERD (gastroesophageal reflux disease)    occasional  . Heartburn   . Joint pain   . Osteoporosis    hip  . Rash    skin rash and itching  . Vitamin D deficiency     Past Surgical History:  Procedure Laterality Date  . ABDOMINAL HYSTERECTOMY  1992  . COLONOSCOPY  2006  . SALPINGOOPHORECTOMY Bilateral     Prior to Admission medications   Medication Sig Start Date End Date Taking? Authorizing Provider  alendronate (FOSAMAX) 70 MG tablet TAKE 1 TABLET BY MOUTH ONCE A WEEK WITH A GLASS OF WATER ON A EMPTY STOMACH AND REMAIN UPRIGHT WITHOUT EATING FOR 30 MINUTES. 12/18/16   [name], MD  Calcium Carbonate-Vit D-Min (CALCIUM 1200 PO) Take by mouth.    [provider]  Cholecalciferol (VITAMIN D3) 1000 UNITS CAPS Take by mouth. am    [provider]  conjugated estrogens (PREMARIN) vaginal cream Place 3.22 Applicatorfuls vaginally 2 (two) times a week. 1/2 gram twice weekly 04/03/17   Defrancesco, Alanda Slim, MD  FLUAD 0.5 ML SUSY ADM 0.5ML IM UTD 06/19/17   [provider]  gabapentin (NEURONTIN) 300 MG capsule Take 300 mg by mouth 2 (two) times daily.    [provider]  meloxicam (MOBIC) 15 MG tablet Take 1 tablet (15 mg total) by mouth daily. Patient taking differently: Take 15 mg as needed by mouth.  03/17/17   [name], MD  pantoprazole (PROTONIX) 40 MG tablet Take 1 tablet (40 mg total) by mouth daily. Patient  taking differently: Take 40 mg daily by mouth. am 07/07/17   Lucilla Lame, MD    Allergies as of 07/24/2017 - Review Complete 07/24/2017  Allergen Reaction Noted  . Lodine [etodolac] Diarrhea and Nausea And Vomiting 01/18/2015    Family History  Problem Relation Age of Onset  . Pancreatic cancer Mother   . Breast cancer Sister 17  . Prostate cancer Father   . Breast cancer Maternal Aunt   . Breast cancer Cousin        pat cousin  . Breast cancer Sister 58  . Colon cancer Paternal Uncle   . Ovarian cancer Neg Hx   . Diabetes Neg Hx     Social History   Socioeconomic History  . Marital status: Single    Spouse name: Not on file  . Number of children: Not on file  . Years of education: Not on file  . Highest education level: Not on file  Social Needs  . Financial resource strain: Not on file  . Food insecurity - worry: Not on file  . Food insecurity - inability: Not on file  . Transportation needs - medical: Not on file  . Transportation needs - non-medical: Not on file  Occupational History  . Not on file  Tobacco Use  . Smoking status: Never Smoker  . Smokeless tobacco: Never Used  Substance  and Sexual Activity  . Alcohol use: No    Alcohol/week: 0.0 oz  . Drug use: No  . Sexual activity: Not Currently    Birth control/protection: Surgical  Other Topics Concern  . Not on file  Social History Narrative  . Not on file    Review of Systems: See HPI, otherwise negative ROS  Physical Exam: Ht 5\' 6"  (1.676 m)   Wt 142 lb (64.4 kg)   BMI 22.92 kg/m  General:   Alert,  pleasant and cooperative in NAD Head:  Normocephalic and atraumatic. Neck:  Supple; no masses or thyromegaly. Lungs:  Clear throughout to auscultation.    Heart:  Regular rate and rhythm. Abdomen:  Soft, nontender and nondistended. Normal bowel sounds, without guarding, and without rebound.   Neurologic:  Alert and  oriented x4;  grossly normal neurologically.  Impression/Plan: Valerie Raymond is here for an endoscopy to be performed for esophageal stricture  Risks, benefits, limitations, and alternatives regarding  endoscopy have been reviewed with the patient.  Questions have been answered.  All parties agreeable.   Lucilla Lame, MD  07/28/2017, 9:53 AM

## 2017-07-29 ENCOUNTER — Encounter: Payer: Self-pay | Admitting: Gastroenterology

## 2017-09-29 ENCOUNTER — Other Ambulatory Visit: Payer: Self-pay | Admitting: Internal Medicine

## 2017-10-14 NOTE — Telephone Encounter (Signed)
Patient scheduled in July.

## 2017-12-08 ENCOUNTER — Other Ambulatory Visit: Payer: Self-pay | Admitting: Internal Medicine

## 2017-12-08 DIAGNOSIS — Z1231 Encounter for screening mammogram for malignant neoplasm of breast: Secondary | ICD-10-CM

## 2018-01-12 DIAGNOSIS — L578 Other skin changes due to chronic exposure to nonionizing radiation: Secondary | ICD-10-CM | POA: Diagnosis not present

## 2018-01-19 ENCOUNTER — Other Ambulatory Visit: Payer: Self-pay | Admitting: Internal Medicine

## 2018-01-19 ENCOUNTER — Ambulatory Visit
Admission: RE | Admit: 2018-01-19 | Discharge: 2018-01-19 | Disposition: A | Discharge status: Home / Self Care | Payer: Medicare Other | Source: Ambulatory Visit | Attending: Internal Medicine | Admitting: Internal Medicine

## 2018-01-19 DIAGNOSIS — Z1231 Encounter for screening mammogram for malignant neoplasm of breast: Secondary | ICD-10-CM

## 2018-02-20 ENCOUNTER — Telehealth: Payer: Self-pay | Admitting: Internal Medicine

## 2018-02-20 NOTE — Telephone Encounter (Signed)
resched awv same day appt.

## 2018-03-23 ENCOUNTER — Ambulatory Visit (INDEPENDENT_AMBULATORY_CARE_PROVIDER_SITE_OTHER): Payer: Medicare Other

## 2018-03-23 ENCOUNTER — Ambulatory Visit: Payer: Medicare Other | Admitting: Internal Medicine

## 2018-03-23 VITALS — BP 128/68 | HR 60 | Temp 98.0°F | Resp 12 | Ht 66.0 in | Wt 146.8 lb

## 2018-03-23 DIAGNOSIS — Z0000 Encounter for general adult medical examination without abnormal findings: Secondary | ICD-10-CM

## 2018-03-23 DIAGNOSIS — Z23 Encounter for immunization: Secondary | ICD-10-CM

## 2018-03-23 NOTE — Progress Notes (Signed)
Subjective:   Valerie Raymond is a 67 y.o. female who presents for an Initial Medicare Annual Wellness Visit.  Review of Systems    N/A  Cardiac Risk Factors include: advanced age (>58men, >7 women);sedentary lifestyle     Objective:    Today's Vitals   03/23/18 0906  BP: 128/68  Pulse: 60  Resp: 12  Temp: 98 F (36.7 C)  TempSrc: Oral  SpO2: 94%  Weight: 146 lb 12.8 oz (66.6 kg)  Height: 5\' 6"  (1.676 m)   Body mass index is 23.69 kg/m.  Advanced Directives 03/23/2018 07/28/2017 07/07/2017 04/21/2017 03/17/2017 03/13/2016 12/15/2015  Does Patient Have a Medical Advance Directive? No No No No Yes;No No No  Would patient like information on creating a medical advance directive? Yes (MAU/Ambulatory/Procedural Areas - Information given) No - Patient declined No - Patient declined No - Patient declined - - No - patient declined information    Current Medications (verified) Outpatient Encounter Medications as of 03/23/2018  Medication Sig  . alendronate (FOSAMAX) 70 MG tablet TAKE 1 TABLET BY MOUTH ONCE A WEEK WITH A GLASS OF WATER ON A EMPTY STOMACH AND REMAIN UPRIGHT WITHOUT EATING FOR 30 MINUTES.  . Calcium Carbonate-Vit D-Min (CALCIUM 1200 PO) Take by mouth.  . Cholecalciferol (VITAMIN D3) 1000 UNITS CAPS Take by mouth. am  . conjugated estrogens (PREMARIN) vaginal cream Place 0.99 Applicatorfuls vaginally 2 (two) times a week. 1/2 gram twice weekly  . pantoprazole (PROTONIX) 40 MG tablet Take 1 tablet (40 mg total) by mouth daily. (Patient taking differently: Take 40 mg daily by mouth. am)  . FLUAD 0.5 ML SUSY ADM 0.5ML IM UTD  . gabapentin (NEURONTIN) 300 MG capsule Take 300 mg by mouth 2 (two) times daily.  . meloxicam (MOBIC) 15 MG tablet Take 1 tablet (15 mg total) by mouth daily. (Patient not taking: Reported on 03/23/2018)   No facility-administered encounter medications on file as of 03/23/2018.     Allergies (verified) Lodine [etodolac]   History: Past Medical History:    Diagnosis Date  . Arthritis    knee,hip,hands  . Degenerative disc disease, lumbar   . DJD (degenerative joint disease)   . Dysphagia    with solids and regurgitation  . Endometriosis    h/o  . GERD (gastroesophageal reflux disease)    occasional  . Heartburn   . Joint pain   . Osteoporosis    hip  . Rash    skin rash and itching  . Vitamin D deficiency    Past Surgical History:  Procedure Laterality Date  . ABDOMINAL HYSTERECTOMY  1992  . COLONOSCOPY  2006  . COLONOSCOPY N/A 01/23/2015   Procedure: COLONOSCOPY;  Surgeon: Lucilla Lame, MD;  Location: [place];  Service: Gastroenterology;  Laterality: N/A;  cecum- [id number]  . ESOPHAGEAL DILATION  04/21/2017   Procedure: ESOPHAGEAL DILATION;  Surgeon: Lucilla Lame, MD;  Location: [place];  Service: Endoscopy;;  . ESOPHAGEAL DILATION  07/07/2017   Procedure: ESOPHAGEAL DILATION;  Surgeon: Lucilla Lame, MD;  Location: [place];  Service: Endoscopy;;  . ESOPHAGEAL DILATION N/A 07/28/2017   Procedure: ESOPHAGEAL DILATION;  Surgeon: Lucilla Lame, MD;  Location: [place];  Service: Endoscopy;  Laterality: N/A;  . ESOPHAGOGASTRODUODENOSCOPY N/A 04/21/2017   Procedure: ESOPHAGOGASTRODUODENOSCOPY (EGD);  Surgeon: Lucilla Lame, MD;  Location: [place];  Service: Endoscopy;  Laterality: N/A;  . ESOPHAGOGASTRODUODENOSCOPY (EGD) WITH PROPOFOL N/A 07/07/2017   Procedure: ESOPHAGOGASTRODUODENOSCOPY (EGD) WITH PROPOFOL;  Surgeon: Lucilla Lame,  MD;  Location: [place];  Service: Endoscopy;  Laterality: N/A;  . ESOPHAGOGASTRODUODENOSCOPY (EGD) WITH PROPOFOL N/A 07/28/2017   Procedure: ESOPHAGOGASTRODUODENOSCOPY (EGD) WITH PROPOFOL;  Surgeon: Lucilla Lame, MD;  Location: [place];  Service: Endoscopy;  Laterality: N/A;  . SALPINGOOPHORECTOMY Bilateral    Family History  Problem Relation Age of Onset  . Pancreatic cancer Mother   . Breast cancer Sister 68  . Prostate cancer  Father   . Breast cancer Maternal Aunt   . Breast cancer Cousin        pat cousin  . Breast cancer Sister 37  . Colon cancer Paternal Uncle   . Prostate cancer Brother   . Ovarian cancer Neg Hx   . Diabetes Neg Hx    Social History   Socioeconomic History  . Marital status: Single    Spouse name: Not on file  . Number of children: 0  . Years of education: Not on file  . Highest education level: Associate degree: academic program  Occupational History    Employer: [employer]  Social Needs  . Financial resource strain: Not hard at all  . Food insecurity:    Worry: Never true    Inability: Never true  . Transportation needs:    Medical: No    Non-medical: No  Tobacco Use  . Smoking status: Never Smoker  . Smokeless tobacco: Never Used  . Tobacco comment: smoking cessation materials not required  Substance and Sexual Activity  . Alcohol use: No    Alcohol/week: 0.0 oz  . Drug use: No  . Sexual activity: Not Currently    Birth control/protection: Surgical  Lifestyle  . Physical activity:    Days per week: 0 days    Minutes per session: 0 min  . Stress: Not at all  Relationships  . Social connections:    Talks on phone: Patient refused    Gets together: Patient refused    Attends religious service: Patient refused    Active member of club or organization: Patient refused    Attends meetings of clubs or organizations: Patient refused    Relationship status: Patient refused  Other Topics Concern  . Not on file  Social History Narrative  . Not on file    Tobacco Counseling Counseling given: No Comment: smoking cessation materials not required  Clinical Intake:  Pre-visit preparation completed: Yes  Pain : No/denies pain   BMI - recorded: 23.69 Nutritional Status: BMI of 19-24  Normal Nutritional Risks: None Diabetes: No  How often do you need to have someone help you when you read instructions, pamphlets, or other written materials from  your doctor or pharmacy?: 1 - Never  Interpreter Needed?: No  Information entered by :: Adrian, LPN   Activities of Daily Living In your present state of health, do you have any difficulty performing the following activities: 03/23/2018 07/28/2017  Hearing? N N  Comment denies hearing aids -  Vision? N N  Comment wears eyeglasses -  Difficulty concentrating or making decisions? N N  Walking or climbing stairs? N N  Dressing or bathing? N N  Doing errands, shopping? N -  Preparing Food and eating ? N -  Comment denies dentures -  Using the Toilet? N -  In the past six months, have you accidently leaked urine? Y -  Comment stress incontinence -  Do you have problems with loss of bowel control? N -  Managing your Medications? N -  Managing your  Finances? N -  Housekeeping or managing your Housekeeping? N -  Some recent data might be hidden     Immunizations and Health Maintenance Immunization History  Administered Date(s) Administered  . Influenza-Unspecified 05/23/2015  . Pneumococcal Conjugate-13 03/17/2017  . Pneumococcal Polysaccharide-23 03/23/2018  . Tdap 03/13/2016  . Zoster 08/19/2011   There are no preventive care reminders to display for this patient.  Patient Care Team: [name], MD as PCP - General (Family Medicine) Defrancesco, Alanda Slim, MD as Consulting Physician (Obstetrics and Gynecology) Jannet Mantis, MD as Consulting Physician (Dermatology)  Indicate any recent Medical Services you may have received from other than [facility] providers in the past year (date may be approximate).     Assessment:   This is a routine wellness examination for [place].  Hearing/Vision screen Vision Screening Comments: Sees Dr. Edison Pace for annual eye exams  Dietary issues and exercise activities discussed: Current Exercise Habits: The patient does not participate in regular exercise at present, Exercise limited by: None identified  Goals    . DIET - INCREASE  WATER INTAKE     Recommend to drink at least 6-8 8oz glasses of water per day.    . Weight < 200 lb (90.719 kg)      Depression Screen PHQ 2/9 Scores 03/23/2018 03/17/2017 03/13/2016  PHQ - 2 Score 0 0 0  PHQ- 9 Score 0 - -    Fall Risk Fall Risk  03/23/2018 03/17/2017 03/13/2016  Falls in the past year? Yes No No  Number falls in past yr: 1 - -  Injury with Fall? No - -  Risk for fall due to : Impaired vision - -  Risk for fall due to: Comment wears eyeglasses - -  Follow up Falls evaluation completed;Education provided;Falls prevention discussed - -    FALL RISK PREVENTION PERTAINING TO HOME: Is your home free of loose throw rugs in walkways, pet beds, electrical cords, etc? Yes Is there adequate lighting in your home to reduce risk of falls?  Yes Are there stairs in or around your home WITH handrails? Yes  ASSISTIVE DEVICES UTILIZED TO PREVENT FALLS: Use of a cane, walker or w/c? No Grab bars in the bathroom? No  Shower chair or a place to sit while bathing? Yes An elevated toilet seat or a handicapped toilet? No  Timed Get Up and Go Performed: Yes. Pt ambulated 10 feet within 5 sec. Gait stead-fast and without the use of an assistive device. No intervention required at this time. Fall risk prevention has been discussed.  Community Resource Referral:  Pt declined my offer to send [employer] Referral to Care Guide for installation of grab bars in the shower or an elevated toilet seat.  Cognitive Function:     6CIT Screen 03/23/2018 03/17/2017  What Year? 0 points 0 points  What month? 0 points 0 points  What time? 0 points 0 points  Count back from 20 0 points 0 points  Months in reverse 0 points 0 points  Repeat phrase 2 points 2 points  Total Score 2 2    Screening Tests Health Maintenance  Topic Date Due  . INFLUENZA VACCINE  04/16/2018  . MAMMOGRAM  01/20/2019  . COLONOSCOPY  01/22/2025  . TETANUS/TDAP  03/13/2026  . DEXA SCAN  Completed  . Hepatitis C  Screening  Completed  . PNA vac Low Risk Adult  Completed    Qualifies for Shingles Vaccine? Yes. Zostavax completed 08/19/11. Due for Shingrix. Education has  been provided regarding the importance of this vaccine. Pt has been advised to call her insurance company to determine her out of pocket expense. Advised she may also receive this vaccine at her local pharmacy or Health Dept. Verbalized acceptance and understanding.  Cancer Screenings: Lung: Low Dose CT Chest recommended if Age 15-80 years, 30 pack-year currently smoking OR have quit w/in 15years. Patient does not qualify. Breast: Up to date on Mammogram? Yes. Completed 01/19/18. Repeat every year  Up to date of Bone Density/Dexa? Yes. Completed 07/23/15. H/o osteoporosis. Taking alendronate and Vit D Colorectal: Completed 01/23/15. Repeat every 10 years  Additional Screenings: Hepatitis C Screening: Completed 01/03/15   Plan:  I have personally reviewed and addressed the Medicare Annual Wellness questionnaire and have noted the following in the patient's chart:  A. Medical and social history B. Use of alcohol, tobacco or illicit drugs  C. Current medications and supplements D. Functional ability and status E.  Nutritional status F.  Physical activity G. Advance directives H. List of other physicians I.  Hospitalizations, surgeries, and ER visits in previous 12 months J.  Manor such as hearing and vision if needed, cognitive and depression L. Referrals and appointments  In addition, I have reviewed and discussed with patient certain preventive protocols, quality metrics, and best practice recommendations. A written personalized care plan for preventive services as well as general preventive health recommendations were provided to patient.  Signed,  Aleatha Borer, LPN Nurse Health Advisor  MD Recommendations: Zostavax completed 08/19/11. Due for Shingrix. Education has been provided regarding the importance of this  vaccine. Pt has been advised to call her insurance company to determine her out of pocket expense. Advised she may also receive this vaccine at her local pharmacy or Health Dept. Verbalized acceptance and understanding.

## 2018-03-23 NOTE — Patient Instructions (Signed)
Valerie Raymond , Thank you for taking time to come for your Medicare Wellness Visit. I appreciate your ongoing commitment to your health goals. Please review the following plan we discussed and let me know if I can assist you in the future.   Screening recommendations/referrals: Colorectal Screening: Up to date Mammogram: Up to date Bone Density: Up to date  Vision and Dental Exams: Recommended annual ophthalmology exams for early detection of glaucoma and other disorders of the eye Recommended annual dental exams for proper oral hygiene  Vaccinations: Influenza vaccine: Overdue Pneumococcal vaccine: Completed series today Tdap vaccine: Up to date Shingles vaccine: Please call your insurance company to determine your out of pocket expense for the Shingrix vaccine. You may also receive this vaccine at your local pharmacy or Health Dept.  Advanced directives: Advance directive discussed with you today. I have provided a copy for you to complete at home and have notarized. Once this is complete please bring a copy in to our office so we can scan it into your chart.  Goals: Recommend to drink at least 6-8 8oz glasses of water per day.  Next appointment: Please schedule your Annual Wellness Visit with your Nurse Health Advisor in one year.  Preventive Care 67 Years and Older, Female Preventive care refers to lifestyle choices and visits with your health care provider that can promote health and wellness. What does preventive care include?  A yearly physical exam. This is also called an annual well check.  Dental exams once or twice a year.  Routine eye exams. Ask your health care provider how often you should have your eyes checked.  Personal lifestyle choices, including:  Daily care of your teeth and gums.  Regular physical activity.  Eating a healthy diet.  Avoiding tobacco and drug use.  Limiting alcohol use.  Practicing safe sex.  Taking low-dose aspirin every day.  Taking  vitamin and mineral supplements as recommended by your health care provider. What happens during an annual well check? The services and screenings done by your health care provider during your annual well check will depend on your age, overall health, lifestyle risk factors, and family history of disease. Counseling  Your health care provider may ask you questions about your:  Alcohol use.  Tobacco use.  Drug use.  Emotional well-being.  Home and relationship well-being.  Sexual activity.  Eating habits.  History of falls.  Memory and ability to understand (cognition).  Work and work Statistician.  Reproductive health. Screening  You may have the following tests or measurements:  Height, weight, and BMI.  Blood pressure.  Lipid and cholesterol levels. These may be checked every 5 years, or more frequently if you are over 55 years old.  Skin check.  Lung cancer screening. You may have this screening every year starting at age 67 if you have a 30-pack-year history of smoking and currently smoke or have quit within the past 15 years.  Fecal occult blood test (FOBT) of the stool. You may have this test every year starting at age 67.  Flexible sigmoidoscopy or colonoscopy. You may have a sigmoidoscopy every 5 years or a colonoscopy every 10 years starting at age 67.  Hepatitis C blood test.  Hepatitis B blood test.  Sexually transmitted disease (STD) testing.  Diabetes screening. This is done by checking your blood sugar (glucose) after you have not eaten for a while (fasting). You may have this done every 1-3 years.  Bone density scan. This is done to screen  for osteoporosis. You may have this done starting at age 67.  Mammogram. This may be done every 1-2 years. Talk to your health care provider about how often you should have regular mammograms. Talk with your health care provider about your test results, treatment options, and if necessary, the need for more  tests. Vaccines  Your health care provider may recommend certain vaccines, such as:  Influenza vaccine. This is recommended every year.  Tetanus, diphtheria, and acellular pertussis (Tdap, Td) vaccine. You may need a Td booster every 10 years.  Zoster vaccine. You may need this after age 67.  Pneumococcal 13-valent conjugate (PCV13) vaccine. One dose is recommended after age 15.  Pneumococcal polysaccharide (PPSV23) vaccine. One dose is recommended after age 67. Talk to your health care provider about which screenings and vaccines you need and how often you need them. This information is not intended to replace advice given to you by your health care provider. Make sure you discuss any questions you have with your health care provider. Document Released: 09/29/2015 Document Revised: 05/22/2016 Document Reviewed: 07/04/2015 Elsevier Interactive Patient Education  2017 [place] Prevention in the Home Falls can cause injuries. They can happen to people of all ages. There are many things you can do to make your home safe and to help prevent falls. What can I do on the outside of my home?  Regularly fix the edges of walkways and driveways and fix any cracks.  Remove anything that might make you trip as you walk through a door, such as a raised step or threshold.  Trim any bushes or trees on the path to your home.  Use bright outdoor lighting.  Clear any walking paths of anything that might make someone trip, such as rocks or tools.  Regularly check to see if handrails are loose or broken. Make sure that both sides of any steps have handrails.  Any raised decks and porches should have guardrails on the edges.  Have any leaves, snow, or ice cleared regularly.  Use sand or salt on walking paths during winter.  Clean up any spills in your garage right away. This includes oil or grease spills. What can I do in the bathroom?  Use night lights.  Install grab bars by the  toilet and in the tub and shower. Do not use towel bars as grab bars.  Use non-skid mats or decals in the tub or shower.  If you need to sit down in the shower, use a plastic, non-slip stool.  Keep the floor dry. Clean up any water that spills on the floor as soon as it happens.  Remove soap buildup in the tub or shower regularly.  Attach bath mats securely with double-sided non-slip rug tape.  Do not have throw rugs and other things on the floor that can make you trip. What can I do in the bedroom?  Use night lights.  Make sure that you have a light by your bed that is easy to reach.  Do not use any sheets or blankets that are too big for your bed. They should not hang down onto the floor.  Have a firm chair that has side arms. You can use this for support while you get dressed.  Do not have throw rugs and other things on the floor that can make you trip. What can I do in the kitchen?  Clean up any spills right away.  Avoid walking on wet floors.  Keep items that you  use a lot in easy-to-reach places.  If you need to reach something above you, use a strong step stool that has a grab bar.  Keep electrical cords out of the way.  Do not use floor polish or wax that makes floors slippery. If you must use wax, use non-skid floor wax.  Do not have throw rugs and other things on the floor that can make you trip. What can I do with my stairs?  Do not leave any items on the stairs.  Make sure that there are handrails on both sides of the stairs and use them. Fix handrails that are broken or loose. Make sure that handrails are as long as the stairways.  Check any carpeting to make sure that it is firmly attached to the stairs. Fix any carpet that is loose or worn.  Avoid having throw rugs at the top or bottom of the stairs. If you do have throw rugs, attach them to the floor with carpet tape.  Make sure that you have a light switch at the top of the stairs and the bottom of  the stairs. If you do not have them, ask someone to add them for you. What else can I do to help prevent falls?  Wear shoes that:  Do not have high heels.  Have rubber bottoms.  Are comfortable and fit you well.  Are closed at the toe. Do not wear sandals.  If you use a stepladder:  Make sure that it is fully opened. Do not climb a closed stepladder.  Make sure that both sides of the stepladder are locked into place.  Ask someone to hold it for you, if possible.  Clearly mark and make sure that you can see:  Any grab bars or handrails.  First and last steps.  Where the edge of each step is.  Use tools that help you move around (mobility aids) if they are needed. These include:  Canes.  Walkers.  Scooters.  Crutches.  Turn on the lights when you go into a dark area. Replace any light bulbs as soon as they burn out.  Set up your furniture so you have a clear path. Avoid moving your furniture around.  If any of your floors are uneven, fix them.  If there are any pets around you, be aware of where they are.  Review your medicines with your doctor. Some medicines can make you feel dizzy. This can increase your chance of falling. Ask your doctor what other things that you can do to help prevent falls. This information is not intended to replace advice given to you by your health care provider. Make sure you discuss any questions you have with your health care provider. Document Released: 06/29/2009 Document Revised: 02/08/2016 Document Reviewed: 10/07/2014 Elsevier Interactive Patient Education  2017 [employer].

## 2018-03-26 ENCOUNTER — Other Ambulatory Visit: Payer: Self-pay

## 2018-03-26 ENCOUNTER — Ambulatory Visit (INDEPENDENT_AMBULATORY_CARE_PROVIDER_SITE_OTHER): Payer: Medicare Other | Admitting: Internal Medicine

## 2018-03-26 ENCOUNTER — Encounter: Payer: Self-pay | Admitting: Internal Medicine

## 2018-03-26 VITALS — BP 144/80 | HR 62 | Temp 97.6°F | Ht 66.0 in | Wt 147.0 lb

## 2018-03-26 DIAGNOSIS — Z0001 Encounter for general adult medical examination with abnormal findings: Secondary | ICD-10-CM | POA: Diagnosis not present

## 2018-03-26 DIAGNOSIS — M818 Other osteoporosis without current pathological fracture: Secondary | ICD-10-CM | POA: Diagnosis not present

## 2018-03-26 DIAGNOSIS — M79604 Pain in right leg: Secondary | ICD-10-CM

## 2018-03-26 DIAGNOSIS — M545 Low back pain: Secondary | ICD-10-CM

## 2018-03-26 DIAGNOSIS — Z0000 Encounter for general adult medical examination without abnormal findings: Secondary | ICD-10-CM | POA: Diagnosis not present

## 2018-03-26 DIAGNOSIS — M5136 Other intervertebral disc degeneration, lumbar region: Secondary | ICD-10-CM

## 2018-03-26 DIAGNOSIS — E785 Hyperlipidemia, unspecified: Secondary | ICD-10-CM | POA: Diagnosis not present

## 2018-03-26 DIAGNOSIS — K222 Esophageal obstruction: Secondary | ICD-10-CM | POA: Diagnosis not present

## 2018-03-26 DIAGNOSIS — M79605 Pain in left leg: Secondary | ICD-10-CM

## 2018-03-26 LAB — POCT URINALYSIS DIPSTICK
Bilirubin, UA: NEGATIVE
Blood, UA: NEGATIVE
Glucose, UA: NEGATIVE
Ketones, UA: NEGATIVE
Leukocytes, UA: NEGATIVE
Nitrite, UA: NEGATIVE
Protein, UA: NEGATIVE
Spec Grav, UA: 1.015 (ref 1.010–1.025)
Urobilinogen, UA: 0.2 E.U./dL
pH, UA: 5.5 (ref 5.0–8.0)

## 2018-03-26 MED ORDER — MELOXICAM 15 MG PO TABS: 15.0000 mg | ORAL_TABLET | Freq: Every day | ORAL | 5 refills | Status: DC

## 2018-03-26 NOTE — Progress Notes (Signed)
Date:  03/26/2018   Name:  Valerie Raymond   DOB:  Feb 28, 1951   MRN:  [id number]   Chief Complaint: Annual Exam Valerie Raymond is a 67 y.o. female who presents today for her Complete Annual Exam. She feels fairly well. She reports exercising minimally - mostly on her feet at work hours per day. She reports she is sleeping fairly well. She recently had her mammogram.  Gastroesophageal Reflux  She complains of dysphagia and heartburn. She reports no abdominal pain, no chest pain, no coughing or no wheezing. The problem occurs frequently. Pertinent negatives include no fatigue. She has tried a PPI for the symptoms. Past procedures include an EGD. and dilatation x 3.  Back Pain  This is a recurrent problem. The problem occurs every several days. The problem has been waxing and waning since onset. The pain is present in the lumbar spine. The quality of the pain is described as burning. The pain is moderate. Pertinent negatives include no abdominal pain, chest pain, dysuria, fever, headaches, numbness or weakness. She has tried NSAIDs and muscle relaxant (had MRI, has ESI x2 no benefit, tried chiropractor which made it worse; could not tolerate gabapentin due to sedation) for the symptoms. The treatment provided mild relief.   OP - on fosamax weekly along with calcium and vitamin D.  Last DEXA in 2016.  Review of Systems  Constitutional: Negative for chills, fatigue, fever and unexpected weight change.  HENT: Negative for congestion, hearing loss, tinnitus, trouble swallowing and voice change.   Eyes: Negative for visual disturbance.  Respiratory: Negative for cough, chest tightness, shortness of breath and wheezing.   Cardiovascular: Negative for chest pain, palpitations and leg swelling.  Gastrointestinal: Positive for dysphagia and heartburn. Negative for abdominal pain, constipation, diarrhea and vomiting.  Endocrine: Negative for polydipsia and polyuria.  Genitourinary: Negative for dysuria,  frequency, genital sores, vaginal bleeding and vaginal discharge.  Musculoskeletal: Positive for back pain. Negative for arthralgias, gait problem and joint swelling.  Skin: Negative for color change and rash.  Neurological: Negative for dizziness, tremors, weakness, light-headedness, numbness and headaches.  Hematological: Negative for adenopathy. Does not bruise/bleed easily.  Psychiatric/Behavioral: Negative for dysphoric mood and sleep disturbance. The patient is not nervous/anxious.     Patient Active Problem List   Diagnosis Date Noted  . Stricture and stenosis of esophagus   . Lumbar pain with radiation down both legs 06/05/2017  . Dysphagia 03/21/2017  . Bursitis of both hips 12/27/2016  . Tonsillith 10/09/2016  . Family history of breast cancer in first degree relative 01/02/2016  . Status post total abdominal hysterectomy and bilateral salpingo-oophorectomy (TAH-BSO) 01/02/2016  . Vaginal atrophy 01/02/2016  . Incomplete bladder emptying 01/02/2016  . Cystocele 01/02/2016  . Colon, diverticulosis 07/23/2015  . Arthritis of knee, degenerative 07/23/2015  . Other osteoporosis without current pathological fracture 07/23/2015  . Avitaminosis D 07/23/2015    Prior to Admission medications   Medication Sig Start Date End Date Taking? Authorizing Provider  alendronate (FOSAMAX) 70 MG tablet TAKE 1 TABLET BY MOUTH ONCE A WEEK WITH A GLASS OF WATER ON A EMPTY STOMACH AND REMAIN UPRIGHT WITHOUT EATING FOR 30 MINUTES. 09/29/17  Yes [name], MD  Calcium Carbonate-Vit D-Min (CALCIUM 1200 PO) Take by mouth.   Yes [provider]  Cholecalciferol (VITAMIN D3) 1000 UNITS CAPS Take by mouth. am   Yes [provider]  conjugated estrogens (PREMARIN) vaginal cream Place 4.25 Applicatorfuls vaginally 2 (two) times a week.  1/2 gram twice weekly 04/03/17  Yes Defrancesco, Alanda Slim, MD  meloxicam (MOBIC) 15 MG tablet Take 1 tablet (15 mg total) by mouth daily. 03/17/17  Yes  [name], MD  pantoprazole (PROTONIX) 40 MG tablet Take 1 tablet (40 mg total) by mouth daily. Patient taking differently: Take 40 mg daily by mouth. am 07/07/17  Yes Lucilla Lame, MD    Allergies  Allergen Reactions  . Lodine [Etodolac] Diarrhea and Nausea And Vomiting    Past Surgical History:  Procedure Laterality Date  . ABDOMINAL HYSTERECTOMY  1992  . COLONOSCOPY  2006  . COLONOSCOPY N/A 01/23/2015   Procedure: COLONOSCOPY;  Surgeon: Lucilla Lame, MD;  Location: [facility];  Service: Gastroenterology;  Laterality: N/A;  cecum- [id number]  . ESOPHAGEAL DILATION  04/21/2017   Procedure: ESOPHAGEAL DILATION;  Surgeon: Lucilla Lame, MD;  Location: [facility];  Service: Endoscopy;;  . ESOPHAGEAL DILATION  07/07/2017   Procedure: ESOPHAGEAL DILATION;  Surgeon: Lucilla Lame, MD;  Location: [facility];  Service: Endoscopy;;  . ESOPHAGEAL DILATION N/A 07/28/2017   Procedure: ESOPHAGEAL DILATION;  Surgeon: Lucilla Lame, MD;  Location: [facility];  Service: Endoscopy;  Laterality: N/A;  . ESOPHAGOGASTRODUODENOSCOPY N/A 04/21/2017   Procedure: ESOPHAGOGASTRODUODENOSCOPY (EGD);  Surgeon: Lucilla Lame, MD;  Location: [facility];  Service: Endoscopy;  Laterality: N/A;  . ESOPHAGOGASTRODUODENOSCOPY (EGD) WITH PROPOFOL N/A 07/07/2017   Procedure: ESOPHAGOGASTRODUODENOSCOPY (EGD) WITH PROPOFOL;  Surgeon: Lucilla Lame, MD;  Location: [facility];  Service: Endoscopy;  Laterality: N/A;  . ESOPHAGOGASTRODUODENOSCOPY (EGD) WITH PROPOFOL N/A 07/28/2017   Procedure: ESOPHAGOGASTRODUODENOSCOPY (EGD) WITH PROPOFOL;  Surgeon: Lucilla Lame, MD;  Location: [facility];  Service: Endoscopy;  Laterality: N/A;  . SALPINGOOPHORECTOMY Bilateral     Social History   Tobacco Use  . Smoking status: Never Smoker  . Smokeless tobacco: Never Used  . Tobacco comment: smoking cessation materials not required  Substance Use Topics  . Alcohol use: No     Alcohol/week: 0.0 oz  . Drug use: No     Medication list has been reviewed and updated.  Current Meds  Medication Sig  . alendronate (FOSAMAX) 70 MG tablet TAKE 1 TABLET BY MOUTH ONCE A WEEK WITH A GLASS OF WATER ON A EMPTY STOMACH AND REMAIN UPRIGHT WITHOUT EATING FOR 30 MINUTES.  . Calcium Carbonate-Vit D-Min (CALCIUM 1200 PO) Take by mouth.  . Cholecalciferol (VITAMIN D3) 1000 UNITS CAPS Take by mouth. am  . conjugated estrogens (PREMARIN) vaginal cream Place 3.00 Applicatorfuls vaginally 2 (two) times a week. 1/2 gram twice weekly  . meloxicam (MOBIC) 15 MG tablet Take 1 tablet (15 mg total) by mouth daily.  . pantoprazole (PROTONIX) 40 MG tablet Take 1 tablet (40 mg total) by mouth daily. (Patient taking differently: Take 40 mg daily by mouth. am)    PHQ 2/9 Scores 03/26/2018 03/23/2018 03/17/2017 03/13/2016  PHQ - 2 Score 0 0 0 0  PHQ- 9 Score 0 0 - -    Physical Exam  Constitutional: She is oriented to person, place, and time. She appears well-developed and well-nourished. No distress.  HENT:  Head: Normocephalic and atraumatic.  Right Ear: Tympanic membrane and ear canal normal.  Left Ear: Tympanic membrane and ear canal normal.  Nose: Right sinus exhibits no maxillary sinus tenderness. Left sinus exhibits no maxillary sinus tenderness.  Mouth/Throat: Uvula is midline and oropharynx is clear and moist.  Eyes: Conjunctivae and EOM are normal. Right eye exhibits no discharge. Left eye exhibits no  discharge. No scleral icterus.  Neck: Normal range of motion. Carotid bruit is not present. No erythema present. No thyromegaly present.  Cardiovascular: Normal rate, regular rhythm, normal heart sounds and normal pulses.  Pulmonary/Chest: Effort normal. No respiratory distress. She has no wheezes. Right breast exhibits no mass, no nipple discharge, no skin change and no tenderness. Left breast exhibits no mass, no nipple discharge, no skin change and no tenderness.  Abdominal: Soft. Bowel  sounds are normal. There is no hepatosplenomegaly. There is no tenderness. There is no CVA tenderness.  Musculoskeletal: Normal range of motion.       Right hip: Normal.       Left hip: Normal.       Right knee: Normal.       Left knee: Normal.       Lumbar back: She exhibits no bony tenderness and no spasm.  Lymphadenopathy:    She has no cervical adenopathy.    She has no axillary adenopathy.  Neurological: She is alert and oriented to person, place, and time. She has normal reflexes. No cranial nerve deficit or sensory deficit.  Skin: Skin is warm, dry and intact. No rash noted.  Psychiatric: She has a normal mood and affect. Her speech is normal and behavior is normal. Thought content normal.  Nursing note and vitals reviewed.   BP (!) 144/80   Pulse 62   Temp 97.6 F (36.4 C)   Ht 5\' 6"  (1.676 m)   Wt 147 lb (66.7 kg)   SpO2 99%   BMI 23.73 kg/m   Assessment and Plan: 1. Annual physical exam Normal exam Encourage walking and stretching regularly - POCT urinalysis dipstick - CBC with Differential/Platelet - Comprehensive metabolic panel - TSH - Lipid panel  2. Stricture and stenosis of esophagus Continue PPI indefinitely  3. Other osteoporosis without current pathological fracture Continue Fosamax, calcium and vitamin D - DG Bone Density; Future  4. Lumbar pain with radiation down both legs Will try low dose Lyrica 50 mg at HS  5. Degenerative disc disease, lumbar Not a surgical candidate - meloxicam (MOBIC) 15 MG tablet; Take 1 tablet (15 mg total) by mouth daily.  Dispense: 30 tablet; Refill: 5   Meds ordered this encounter  Medications  . meloxicam (MOBIC) 15 MG tablet    Sig: Take 1 tablet (15 mg total) by mouth daily.    Dispense:  30 tablet    Refill:  5    Partially dictated using Editor, commissioning. Any errors are unintentional.  Halina Maidens, MD [facility]  03/26/2018   There are no diagnoses linked to  this encounter.

## 2018-03-26 NOTE — Patient Instructions (Signed)
Health Maintenance for Postmenopausal Women Menopause is a normal process in which your reproductive ability comes to an end. This process happens gradually over a span of months to years, usually between the ages of 22 and 9. Menopause is complete when you have missed 12 consecutive menstrual periods. It is important to talk with your health care provider about some of the most common conditions that affect postmenopausal women, such as heart disease, cancer, and bone loss (osteoporosis). Adopting a healthy lifestyle and getting preventive care can help to promote your health and wellness. Those actions can also lower your chances of developing some of these common conditions. What should I know about menopause? During menopause, you may experience a number of symptoms, such as:  Moderate-to-severe hot flashes.  Night sweats.  Decrease in sex drive.  Mood swings.  Headaches.  Tiredness.  Irritability.  Memory problems.  Insomnia.  Choosing to treat or not to treat menopausal changes is an individual decision that you make with your health care provider. What should I know about hormone replacement therapy and supplements? Hormone therapy products are effective for treating symptoms that are associated with menopause, such as hot flashes and night sweats. Hormone replacement carries certain risks, especially as you become older. If you are thinking about using estrogen or estrogen with progestin treatments, discuss the benefits and risks with your health care provider. What should I know about heart disease and stroke? Heart disease, heart attack, and stroke become more likely as you age. This may be due, in part, to the hormonal changes that your body experiences during menopause. These can affect how your body processes dietary fats, triglycerides, and cholesterol. Heart attack and stroke are both medical emergencies. There are many things that you can do to help prevent heart disease  and stroke:  Have your blood pressure checked at least every 1-2 years. High blood pressure causes heart disease and increases the risk of stroke.  If you are 53-22 years old, ask your health care provider if you should take aspirin to prevent a heart attack or a stroke.  Do not use any tobacco products, including cigarettes, chewing tobacco, or electronic cigarettes. If you need help quitting, ask your health care provider.  It is important to eat a healthy diet and maintain a healthy weight. ? Be sure to include plenty of vegetables, fruits, low-fat dairy products, and lean protein. ? Avoid eating foods that are high in solid fats, added sugars, or salt (sodium).  Get regular exercise. This is one of the most important things that you can do for your health. ? Try to exercise for at least 150 minutes each week. The type of exercise that you do should increase your heart rate and make you sweat. This is known as moderate-intensity exercise. ? Try to do strengthening exercises at least twice each week. Do these in addition to the moderate-intensity exercise.  Know your numbers.Ask your health care provider to check your cholesterol and your blood glucose. Continue to have your blood tested as directed by your health care provider.  What should I know about cancer screening? There are several types of cancer. Take the following steps to reduce your risk and to catch any cancer development as early as possible. Breast Cancer  Practice breast self-awareness. ? This means understanding how your breasts normally appear and feel. ? It also means doing regular breast self-exams. Let your health care provider know about any changes, no matter how small.  If you are 40  or older, have a clinician do a breast exam (clinical breast exam or CBE) every year. Depending on your age, family history, and medical history, it may be recommended that you also have a yearly breast X-ray (mammogram).  If you  have a family history of breast cancer, talk with your health care provider about genetic screening.  If you are at high risk for breast cancer, talk with your health care provider about having an MRI and a mammogram every year.  Breast cancer (BRCA) gene test is recommended for women who have family members with BRCA-related cancers. Results of the assessment will determine the need for genetic counseling and BRCA1 and for BRCA2 testing. BRCA-related cancers include these types: ? Breast. This occurs in males or females. ? Ovarian. ? Tubal. This may also be called fallopian tube cancer. ? Cancer of the abdominal or pelvic lining (peritoneal cancer). ? Prostate. ? Pancreatic.  Cervical, Uterine, and Ovarian Cancer Your health care provider may recommend that you be screened regularly for cancer of the pelvic organs. These include your ovaries, uterus, and vagina. This screening involves a pelvic exam, which includes checking for microscopic changes to the surface of your cervix (Pap test).  For women ages 21-65, health care providers may recommend a pelvic exam and a Pap test every three years. For women ages 79-65, they may recommend the Pap test and pelvic exam, combined with testing for human papilloma virus (HPV), every five years. Some types of HPV increase your risk of cervical cancer. Testing for HPV may also be done on women of any age who have unclear Pap test results.  Other health care providers may not recommend any screening for nonpregnant women who are considered low risk for pelvic cancer and have no symptoms. Ask your health care provider if a screening pelvic exam is right for you.  If you have had past treatment for cervical cancer or a condition that could lead to cancer, you need Pap tests and screening for cancer for at least 20 years after your treatment. If Pap tests have been discontinued for you, your risk factors (such as having a new sexual partner) need to be  reassessed to determine if you should start having screenings again. Some women have medical problems that increase the chance of getting cervical cancer. In these cases, your health care provider may recommend that you have screening and Pap tests more often.  If you have a family history of uterine cancer or ovarian cancer, talk with your health care provider about genetic screening.  If you have vaginal bleeding after reaching menopause, tell your health care provider.  There are currently no reliable tests available to screen for ovarian cancer.  Lung Cancer Lung cancer screening is recommended for adults 69-62 years old who are at high risk for lung cancer because of a history of smoking. A yearly low-dose CT scan of the lungs is recommended if you:  Currently smoke.  Have a history of at least 30 pack-years of smoking and you currently smoke or have quit within the past 15 years. A pack-year is smoking an average of one pack of cigarettes per day for one year.  Yearly screening should:  Continue until it has been 15 years since you quit.  Stop if you develop a health problem that would prevent you from having lung cancer treatment.  Colorectal Cancer  This type of cancer can be detected and can often be prevented.  Routine colorectal cancer screening usually begins at  age 42 and continues through age 45.  If you have risk factors for colon cancer, your health care provider may recommend that you be screened at an earlier age.  If you have a family history of colorectal cancer, talk with your health care provider about genetic screening.  Your health care provider may also recommend using home test kits to check for hidden blood in your stool.  A small camera at the end of a tube can be used to examine your colon directly (sigmoidoscopy or colonoscopy). This is done to check for the earliest forms of colorectal cancer.  Direct examination of the colon should be repeated every  5-10 years until age 71. However, if early forms of precancerous polyps or small growths are found or if you have a family history or genetic risk for colorectal cancer, you may need to be screened more often.  Skin Cancer  Check your skin from head to toe regularly.  Monitor any moles. Be sure to tell your health care provider: ? About any new moles or changes in moles, especially if there is a change in a mole's shape or color. ? If you have a mole that is larger than the size of a pencil eraser.  If any of your family members has a history of skin cancer, especially at a young age, talk with your health care provider about genetic screening.  Always use sunscreen. Apply sunscreen liberally and repeatedly throughout the day.  Whenever you are outside, protect yourself by wearing long sleeves, pants, a wide-brimmed hat, and sunglasses.  What should I know about osteoporosis? Osteoporosis is a condition in which bone destruction happens more quickly than new bone creation. After menopause, you may be at an increased risk for osteoporosis. To help prevent osteoporosis or the bone fractures that can happen because of osteoporosis, the following is recommended:  If you are 46-71 years old, get at least 1,000 mg of calcium and at least 600 mg of vitamin D per day.  If you are older than age 55 but younger than age 65, get at least 1,200 mg of calcium and at least 600 mg of vitamin D per day.  If you are older than age 54, get at least 1,200 mg of calcium and at least 800 mg of vitamin D per day.  Smoking and excessive alcohol intake increase the risk of osteoporosis. Eat foods that are rich in calcium and vitamin D, and do weight-bearing exercises several times each week as directed by your health care provider. What should I know about how menopause affects my mental health? Depression may occur at any age, but it is more common as you become older. Common symptoms of depression  include:  Low or sad mood.  Changes in sleep patterns.  Changes in appetite or eating patterns.  Feeling an overall lack of motivation or enjoyment of activities that you previously enjoyed.  Frequent crying spells.  Talk with your health care provider if you think that you are experiencing depression. What should I know about immunizations? It is important that you get and maintain your immunizations. These include:  Tetanus, diphtheria, and pertussis (Tdap) booster vaccine.  Influenza every year before the flu season begins.  Pneumonia vaccine.  Shingles vaccine.  Your health care provider may also recommend other immunizations. This information is not intended to replace advice given to you by your health care provider. Make sure you discuss any questions you have with your health care provider. Document Released: 10/25/2005  Document Revised: 03/22/2016 Document Reviewed: 06/06/2015 Elsevier Interactive Patient Education  2018 [employer] Inc[employer]  

## 2018-03-27 LAB — LIPID PANEL
Chol/HDL Ratio: 2.9 ratio (ref 0.0–4.4)
Cholesterol, Total: 200 mg/dL — ABNORMAL HIGH (ref 100–199)
HDL: 70 mg/dL (ref >39)
LDL Calculated: 108 mg/dL — ABNORMAL HIGH (ref 0–99)
Triglycerides: 109 mg/dL (ref 0–149)
VLDL Cholesterol Cal: 22 mg/dL (ref 5–40)

## 2018-03-27 LAB — COMPREHENSIVE METABOLIC PANEL
ALT: 23 IU/L (ref 0–32)
AST: 30 IU/L (ref 0–40)
Albumin/Globulin Ratio: 1.6 (ref 1.2–2.2)
Albumin: 4.9 g/dL — ABNORMAL HIGH (ref 3.6–4.8)
Alkaline Phosphatase: 79 IU/L (ref 39–117)
BUN/Creatinine Ratio: 14 (ref 12–28)
BUN: 11 mg/dL (ref 8–27)
Bilirubin Total: 0.4 mg/dL (ref 0.0–1.2)
CO2: 24 mmol/L (ref 20–29)
Calcium: 10.2 mg/dL (ref 8.7–10.3)
Chloride: 100 mmol/L (ref 96–106)
Creatinine, Ser: 0.78 mg/dL (ref 0.57–1.00)
GFR calc Af Amer: 92 mL/min/{1.73_m2} (ref >59)
GFR calc non Af Amer: 79 mL/min/{1.73_m2} (ref >59)
Globulin, Total: 3 g/dL (ref 1.5–4.5)
Glucose: 79 mg/dL (ref 65–99)
Potassium: 4.1 mmol/L (ref 3.5–5.2)
Sodium: 142 mmol/L (ref 134–144)
Total Protein: 7.9 g/dL (ref 6.0–8.5)

## 2018-03-27 LAB — CBC WITH DIFFERENTIAL/PLATELET
Basophils Absolute: 0 10*3/uL (ref 0.0–0.2)
Basos: 1 %
EOS (ABSOLUTE): 0.2 10*3/uL (ref 0.0–0.4)
Eos: 4 %
Hematocrit: 41.2 % (ref 34.0–46.6)
Hemoglobin: 13.8 g/dL (ref 11.1–15.9)
Immature Grans (Abs): 0 10*3/uL (ref 0.0–0.1)
Immature Granulocytes: 0 %
Lymphocytes Absolute: 0.7 10*3/uL (ref 0.7–3.1)
Lymphs: 14 %
MCH: 29.8 pg (ref 26.6–33.0)
MCHC: 33.5 g/dL (ref 31.5–35.7)
MCV: 89 fL (ref 79–97)
Monocytes Absolute: 0.5 10*3/uL (ref 0.1–0.9)
Monocytes: 11 %
Neutrophils Absolute: 3.4 10*3/uL (ref 1.4–7.0)
Neutrophils: 70 %
Platelets: 305 10*3/uL (ref 150–450)
RBC: 4.63 x10E6/uL (ref 3.77–5.28)
RDW: 13.7 % (ref 12.3–15.4)
WBC: 4.8 10*3/uL (ref 3.4–10.8)

## 2018-03-27 LAB — TSH: TSH: 2.24 u[IU]/mL (ref 0.450–4.500)

## 2018-04-09 ENCOUNTER — Ambulatory Visit: Payer: Medicare Other | Admitting: Obstetrics and Gynecology

## 2018-04-09 ENCOUNTER — Encounter: Payer: Self-pay | Admitting: Obstetrics and Gynecology

## 2018-04-09 VITALS — BP 160/93 | HR 69 | Ht 66.0 in | Wt 147.8 lb

## 2018-04-09 DIAGNOSIS — Z90722 Acquired absence of ovaries, bilateral: Secondary | ICD-10-CM

## 2018-04-09 DIAGNOSIS — N8111 Cystocele, midline: Secondary | ICD-10-CM

## 2018-04-09 DIAGNOSIS — Z4689 Encounter for fitting and adjustment of other specified devices: Secondary | ICD-10-CM | POA: Diagnosis not present

## 2018-04-09 DIAGNOSIS — R339 Retention of urine, unspecified: Secondary | ICD-10-CM | POA: Diagnosis not present

## 2018-04-09 DIAGNOSIS — N952 Postmenopausal atrophic vaginitis: Secondary | ICD-10-CM | POA: Diagnosis not present

## 2018-04-09 DIAGNOSIS — Z90710 Acquired absence of both cervix and uterus: Secondary | ICD-10-CM | POA: Diagnosis not present

## 2018-04-09 DIAGNOSIS — Z9079 Acquired absence of other genital organ(s): Secondary | ICD-10-CM

## 2018-04-09 NOTE — Progress Notes (Signed)
Pt stated that the pessary is working well no complaints.

## 2018-04-09 NOTE — Patient Instructions (Signed)
1.  Return in 1 year for pessary maintenance 2.  Continue using Premarin cream intravaginal weekly 3.  Continue inserting the pessary every morning and removing it every evening

## 2018-04-09 NOTE — Progress Notes (Signed)
GYN ENCOUNTER NOTE  Subjective:       Valerie Raymond is a 67 y.o. G0P0 female is here for gynecologic evaluation of the following issues:  1.  Annual pessary maintenance. 2.  Cystocele 3.  Incomplete bladder emptying 4.  Vaginal atrophy 5.  Status post TAH/BSO  Gait presents for 47-month pessary assessment.  (Last visit 04/03/2018) Pessary type: Ring with support Symptoms: Patient remains asymptomatic.  She has more complete bladder emptying with the pessary in place.  Bowel function is normal. Patient is removing the pessary at nights and reinserting it daily in the a.m. Medications: Premarin cream intravaginal weekly  Gynecologic History No LMP recorded. Patient has had a hysterectomy. Contraception: status post hysterectomy Last Pap: No history of abnormals; Last mammogram: 01/19/2018 BI-RADS 1  Obstetric History OB History  Gravida Para Term Preterm AB Living  0            SAB TAB Ectopic Multiple Live Births               Past Medical History:  Diagnosis Date  . Arthralgia of hip 07/23/2015  . Arthritis    knee,hip,hands  . Degenerative disc disease, lumbar   . DJD (degenerative joint disease)   . Dysphagia    with solids and regurgitation  . Endometriosis    h/o  . GERD (gastroesophageal reflux disease)    occasional  . Heartburn   . Joint pain   . Osteoporosis    hip  . Pelvic pressure in female 01/02/2016  . Rash    skin rash and itching  . Vitamin D deficiency     Past Surgical History:  Procedure Laterality Date  . ABDOMINAL HYSTERECTOMY  1992  . COLONOSCOPY  2006  . COLONOSCOPY N/A 01/23/2015   Procedure: COLONOSCOPY;  Surgeon: Lucilla Lame, MD;  Location: [place];  Service: Gastroenterology;  Laterality: N/A;  cecum- [id number]  . ESOPHAGEAL DILATION  04/21/2017   Procedure: ESOPHAGEAL DILATION;  Surgeon: Lucilla Lame, MD;  Location: [place];  Service: Endoscopy;;  . ESOPHAGEAL DILATION  07/07/2017   Procedure: ESOPHAGEAL DILATION;   Surgeon: Lucilla Lame, MD;  Location: [place];  Service: Endoscopy;;  . ESOPHAGEAL DILATION N/A 07/28/2017   Procedure: ESOPHAGEAL DILATION;  Surgeon: Lucilla Lame, MD;  Location: [place];  Service: Endoscopy;  Laterality: N/A;  . ESOPHAGOGASTRODUODENOSCOPY N/A 04/21/2017   Procedure: ESOPHAGOGASTRODUODENOSCOPY (EGD);  Surgeon: Lucilla Lame, MD;  Location: [place];  Service: Endoscopy;  Laterality: N/A;  . ESOPHAGOGASTRODUODENOSCOPY (EGD) WITH PROPOFOL N/A 07/07/2017   Procedure: ESOPHAGOGASTRODUODENOSCOPY (EGD) WITH PROPOFOL;  Surgeon: Lucilla Lame, MD;  Location: [place];  Service: Endoscopy;  Laterality: N/A;  . ESOPHAGOGASTRODUODENOSCOPY (EGD) WITH PROPOFOL N/A 07/28/2017   Procedure: ESOPHAGOGASTRODUODENOSCOPY (EGD) WITH PROPOFOL;  Surgeon: Lucilla Lame, MD;  Location: [place];  Service: Endoscopy;  Laterality: N/A;  . SALPINGOOPHORECTOMY Bilateral     Current Outpatient Medications on File Prior to Visit  Medication Sig Dispense Refill  . alendronate (FOSAMAX) 70 MG tablet TAKE 1 TABLET BY MOUTH ONCE A WEEK WITH A GLASS OF WATER ON A EMPTY STOMACH AND REMAIN UPRIGHT WITHOUT EATING FOR 30 MINUTES. 4 tablet 12  . Calcium Carbonate-Vit D-Min (CALCIUM 1200 PO) Take by mouth.    . Cholecalciferol (VITAMIN D3) 1000 UNITS CAPS Take by mouth. am    . conjugated estrogens (PREMARIN) vaginal cream Place 2.68 Applicatorfuls vaginally 2 (two) times a week. 1/2 gram twice weekly 42.5 g 12  . meloxicam (  MOBIC) 15 MG tablet Take 1 tablet (15 mg total) by mouth daily. 30 tablet 5  . pantoprazole (PROTONIX) 40 MG tablet Take 1 tablet (40 mg total) by mouth daily. (Patient taking differently: Take 40 mg daily by mouth. am) 30 tablet 11   No current facility-administered medications on file prior to visit.     Allergies  Allergen Reactions  . Lodine [Etodolac] Diarrhea and Nausea And Vomiting    Social History   Socioeconomic History  .  Marital status: Single    Spouse name: Not on file  . Number of children: 0  . Years of education: Not on file  . Highest education level: Associate degree: academic program  Occupational History    Employer: [employer]  Social Needs  . Financial resource strain: Not hard at all  . Food insecurity:    Worry: Never true    Inability: Never true  . Transportation needs:    Medical: No    Non-medical: No  Tobacco Use  . Smoking status: Never Smoker  . Smokeless tobacco: Never Used  . Tobacco comment: smoking cessation materials not required  Substance and Sexual Activity  . Alcohol use: No    Alcohol/week: 0.0 oz  . Drug use: No  . Sexual activity: Not Currently    Birth control/protection: Surgical  Lifestyle  . Physical activity:    Days per week: 0 days    Minutes per session: 0 min  . Stress: Not at all  Relationships  . Social connections:    Talks on phone: Patient refused    Gets together: Patient refused    Attends religious service: Patient refused    Active member of club or organization: Patient refused    Attends meetings of clubs or organizations: Patient refused    Relationship status: Patient refused  . Intimate partner violence:    Fear of current or ex partner: No    Emotionally abused: No    Physically abused: No    Forced sexual activity: No  Other Topics Concern  . Not on file  Social History Narrative  . Not on file    Family History  Problem Relation Age of Onset  . Pancreatic cancer Mother   . Breast cancer Sister 39  . Prostate cancer Father   . Breast cancer Maternal Aunt   . Breast cancer Cousin        pat cousin  . Breast cancer Sister 73  . Colon cancer Paternal Uncle   . Prostate cancer Brother   . Ovarian cancer Neg Hx   . Diabetes Neg Hx     The following portions of the patient's history were reviewed and updated as appropriate: allergies, current medications, past family history, past medical history, past  social history, past surgical history and problem list.  Review of Systems Review of Systems - General ROS: negative Gastrointestinal ROS: no abdominal pain, change in bowel habits, or black or bloody stools Genito-Urinary ROS: no dysuria, trouble voiding, or hematuria Musculoskeletal ROS: negative except for low back pain Objective:   BP (!) 160/93   Pulse 69   Ht 5\' 6"  (1.676 m)   Wt 147 lb 12.8 oz (67 kg)   BMI 23.86 kg/m  CONSTITUTIONAL: Well-developed, well-nourished female in no acute distress.  HENT:  Normocephalic, atraumatic.  NECK: Normal range of motion, supple, no masses.  Normal thyroid.  SKIN: Skin is warm and dry. No rash noted. Not diaphoretic. No erythema. No pallor. [place]: Alert  and oriented to person, place, and time. PSYCHIATRIC: Normal mood and affect. Normal behavior. Normal judgment and thought content. CARDIOVASCULAR:Not Examined RESPIRATORY: Not Examined BREASTS: Not Examined ABDOMEN: Soft, non distended; Non tender.  No Organomegaly. PELVIC:  External Genitalia: Normal  BUS: Normal  Vagina: Normal  Cervix: Surgically absent  Uterus: Surgically absent  Adnexa: Normal; nonpalpable nontender  RV: Normal   Bladder: Nontender MUSCULOSKELETAL: Normal range of motion. No tenderness.  No cyanosis, clubbing, or edema.     Assessment:   1.  Midline cystocele 2.  Incomplete bladder emptying, resolved with pessary use 3.  Status post hysterectomy 4.  Vaginal atrophy, stable   Plan:   1.  Continue with pessary insertion every morning and pessary removal in early evening 2.  Continue with Premarin cream intravaginal weekly 3.  Return in 12 months for pessary maintenance 4.  Continue with gynecologic exams with Dr. Army Melia on a yearly basis  A total of 15 minutes were spent face-to-face with the patient during this encounter and over half of that time dealt with counseling and coordination of care.  Brayton Mars, MD  Note: This  dictation was prepared with Dragon dictation along with smaller phrase technology. Any transcriptional errors that result from this process are unintentional.

## 2018-04-13 ENCOUNTER — Ambulatory Visit
Admission: RE | Admit: 2018-04-13 | Discharge: 2018-04-13 | Disposition: A | Discharge status: Home / Self Care | Payer: Medicare Other | Source: Ambulatory Visit | Attending: Internal Medicine | Admitting: Internal Medicine

## 2018-04-13 DIAGNOSIS — M818 Other osteoporosis without current pathological fracture: Secondary | ICD-10-CM | POA: Diagnosis not present

## 2018-04-13 DIAGNOSIS — M85851 Other specified disorders of bone density and structure, right thigh: Secondary | ICD-10-CM | POA: Diagnosis not present

## 2018-04-13 DIAGNOSIS — Z780 Asymptomatic menopausal state: Secondary | ICD-10-CM | POA: Diagnosis not present

## 2018-04-20 ENCOUNTER — Other Ambulatory Visit: Payer: Self-pay | Admitting: Obstetrics and Gynecology

## 2018-05-22 ENCOUNTER — Encounter: Payer: Self-pay | Admitting: Internal Medicine

## 2018-05-22 DIAGNOSIS — E785 Hyperlipidemia, unspecified: Secondary | ICD-10-CM | POA: Insufficient documentation

## 2018-06-29 ENCOUNTER — Other Ambulatory Visit: Payer: Self-pay | Admitting: Internal Medicine

## 2018-07-23 DIAGNOSIS — H2513 Age-related nuclear cataract, bilateral: Secondary | ICD-10-CM | POA: Diagnosis not present

## 2018-08-03 ENCOUNTER — Other Ambulatory Visit: Payer: Self-pay | Admitting: Obstetrics and Gynecology

## 2018-09-30 ENCOUNTER — Other Ambulatory Visit: Payer: Self-pay | Admitting: Internal Medicine

## 2018-11-13 IMAGING — MR MR LUMBAR SPINE W/O CM
4 of 5 series · 24 of 48 positions shown · non-contrast
Comparison: Right hip radiographs 12/06/2013

CLINICAL DATA: 65-year-old female with lumbar back pain radiating
down both lower extremities to the knees. Chronic right leg
involvement for about 3 years, but more recent onset left leg pain
since July 2016. Associated weakness.

EXAM:
MRI LUMBAR SPINE WITHOUT CONTRAST
TECHNIQUE: Multiplanar, multisequence MR imaging of the lumbar spine was
performed. No intravenous contrast was administered.

[Series 2: T2 · sagittal · 4.0mm · 0.81mm/px · 7 of 15 slices shown (1 of 2)]
[im 1/15]
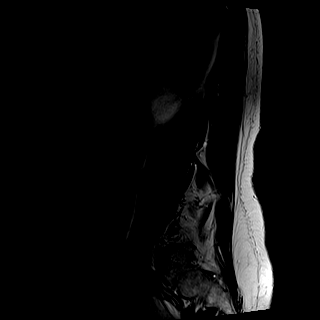
[im 3/15]
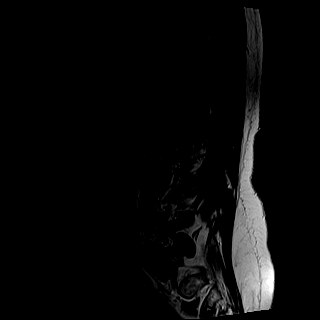
[im 5/15]
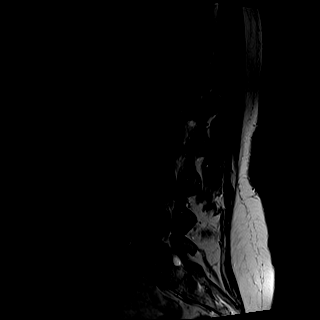
[im 8/15]
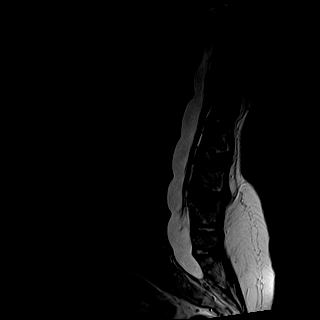
[im 10/15]
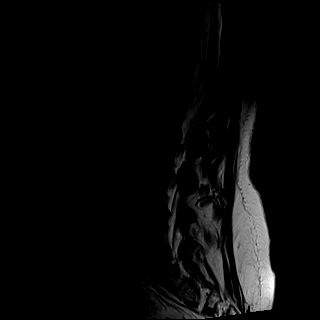
[im 12/15]
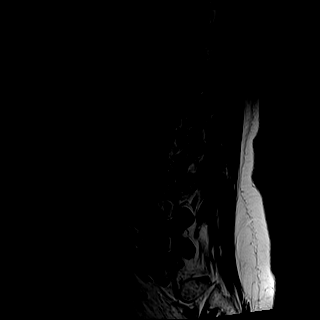
[im 15/15]
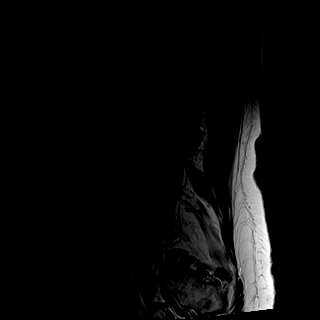

[Series 3: T1 · sagittal · 4.0mm · 0.41mm/px · 6 of 15 slices shown (1 of 2)]
[im 1/15]
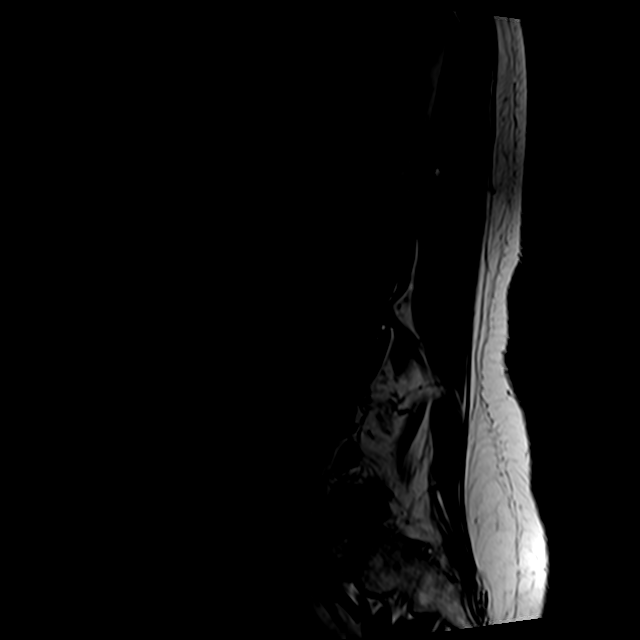
[im 3/15]
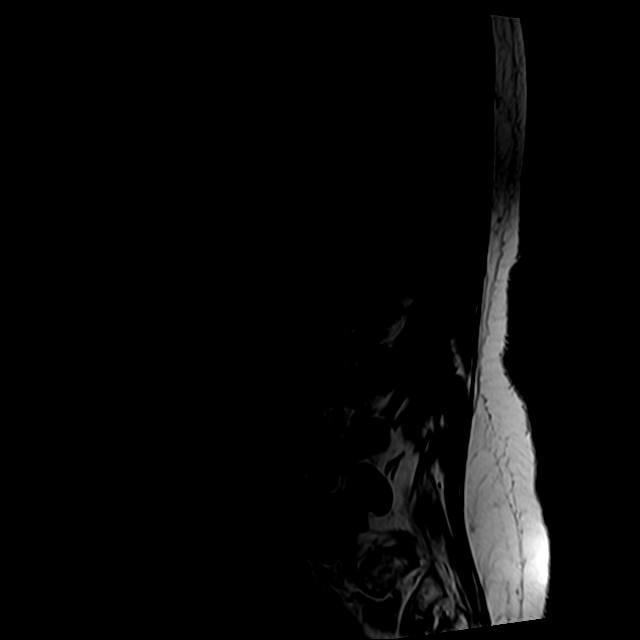
[im 5/15]
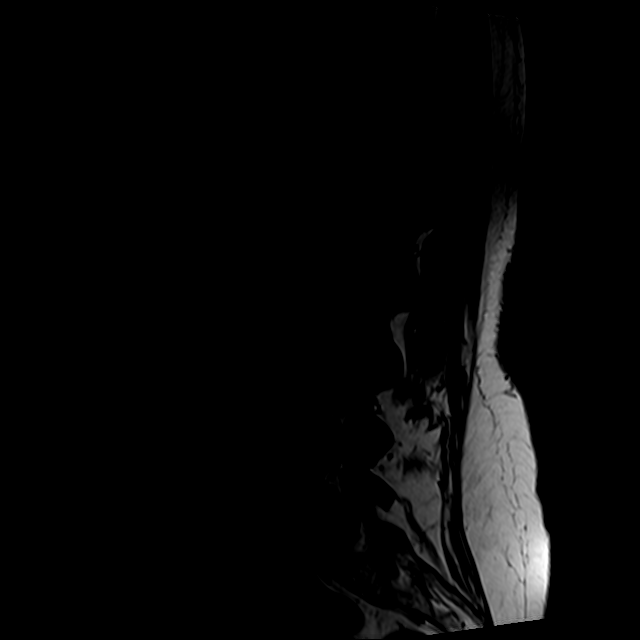
[im 8/15]
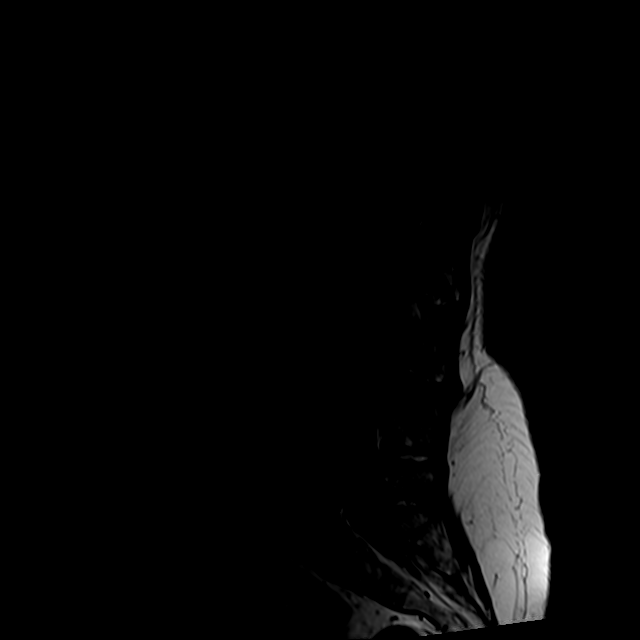
[im 10/15]
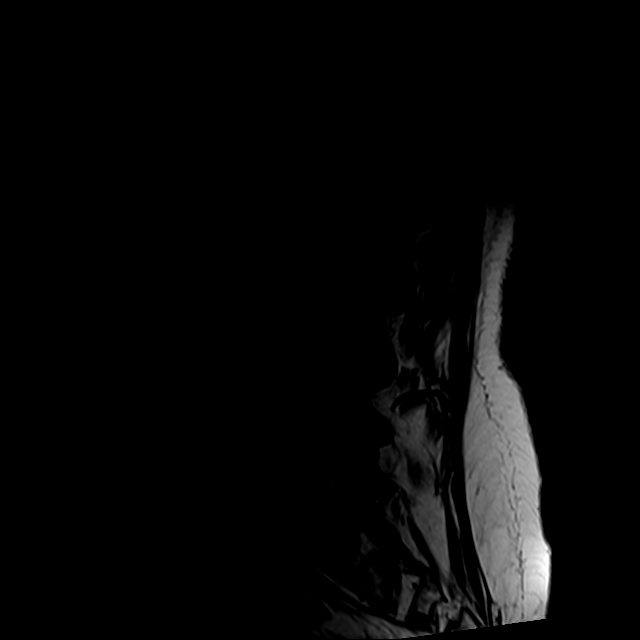
[im 12/15]
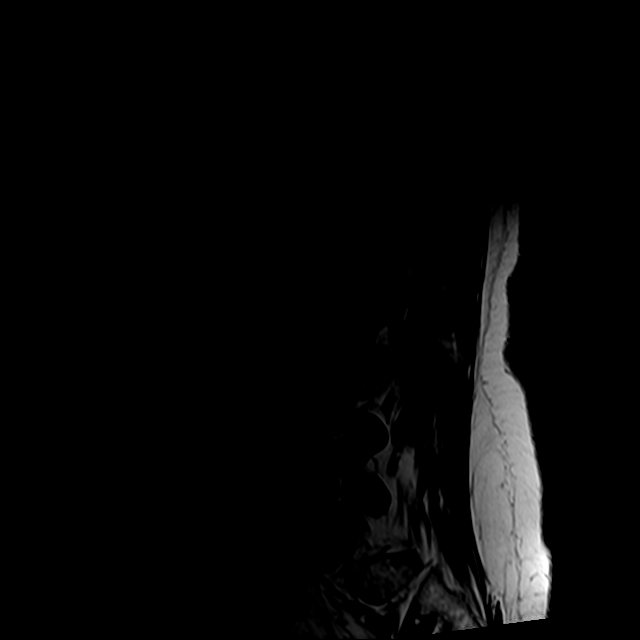

[Series 5: T2 · axial · 4.0mm · 0.78mm/px · z∈[-105,+75]mm · 8 of 34 slices shown (2 of 2)]
[im 1/34]
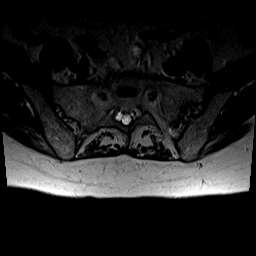
[im 6/34]
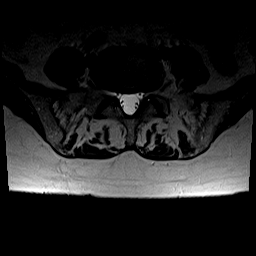
[im 11/34]
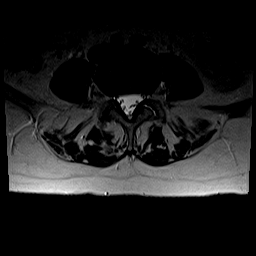
[im 16/34]
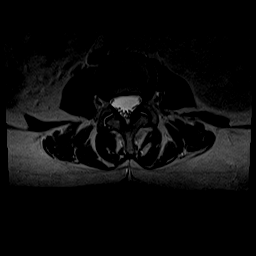
[im 18/34]
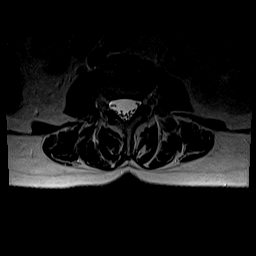
[im 23/34]
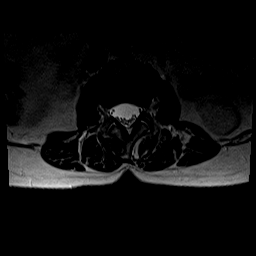
[im 28/34]
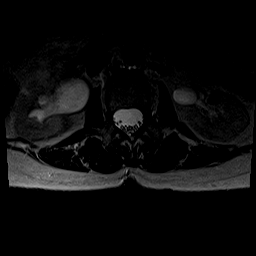
[im 34/34]
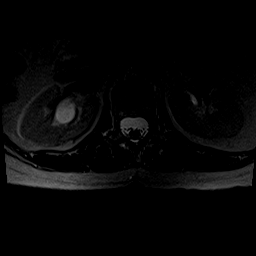

[Series 6: T1 · axial · 4.0mm · 0.39mm/px · z∈[-80,+45]mm · 3 of 34 slices shown (2 of 2)]
[im 6/34]
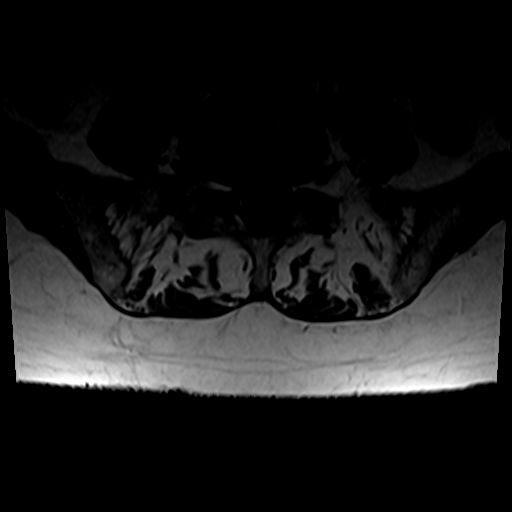
[im 18/34]
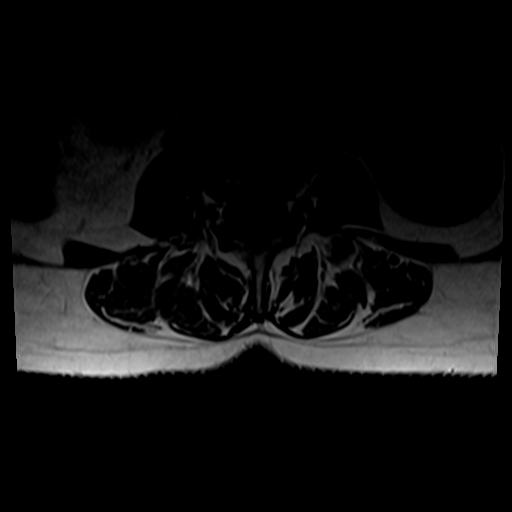
[im 28/34]
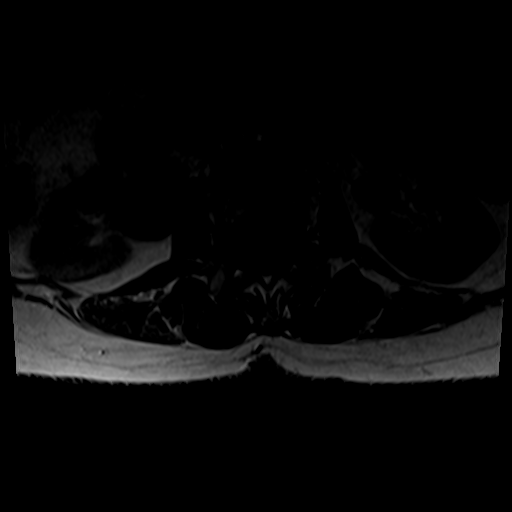

[24 of 48 positions shown; findings below may reference images not displayed]

FINDINGS: Segmentation: Lumbar segmentation appears to be normal and will be
designated as such for this report.

Alignment: Mild to moderate dextroconvex lumbar curvature. Mild
straightening of lumbar lordosis. No spondylolisthesis.

Vertebrae: No marrow edema or evidence of acute osseous abnormality.
Visualized bone marrow signal is within normal limits. Mild
degenerative endplate marrow signal changes at the L4 and L5
endplates. Negative visible sacrum and SI joints.

Conus medullaris: Extends to the L1-L2 level and appears normal.
Capacious spinal canal.

Paraspinal and other soft tissues: Appearance of mild right
hydronephrosis and enlargement of the right renal pelvis (series 5,
image 6), but rapid tapering of the pelvis to the proximal right
ureter. Visible left kidney demonstrates extra renal pelvis
appearance (normal variant) without suggestion of left
hydronephrosis. No hydroureter on either side.

Otherwise negative visible abdominal viscera. Negative visualized
posterior paraspinal soft tissues.

Disc levels:

T11-T12:  Negative.

T12-L1:  Minimal disc bulge.

L1-L2: Mild disc desiccation with disc space loss and mild
circumferential disc bulging. Broad-based but mildly lobulated
posterior component of disc, maximal at the left lateral recess
(series 5, image 5), but no convincing lateral recess stenosis. No
spinal or foraminal stenosis.

L2-L3:  Negative.

L3-L4: Mild disc desiccation and circumferential disc bulge with
broad-based posterior component. Mild facet and ligament flavum
hypertrophy. Involvement of the left lateral recess and left
foramen. No significant foraminal stenosis. Borderline to mild left
lateral recess stenosis (series 5, image 18, descending left L4
nerve root level). No spinal stenosis.

L4-L5: Disc desiccation and disc space loss with right eccentric
disc bulge. Mild endplate spurring. Mild facet hypertrophy.
Borderline to mild right lateral recess stenosis (descending right
L5 nerve root level series 5, image 24). No spinal or significant
foraminal stenosis.

L5-S1: Disc desiccation and disc space loss. Right eccentric
circumferential disc bulge with endplate spurring. Mild facet and
ligament flavum hypertrophy. No spinal or convincing lateral recess
stenosis. Borderline to mild right greater than left L5 foraminal
stenosis.
IMPRESSION: Capacious lumbar spinal canal and mostly mild for age lumbar spine
degeneration. No acute osseous abnormality.

There is mild disc bulging in the upper lumbar spine, with chronic
more advanced disc, endplate, and posterior element degeneration in
the lower lumbar spine.

However, there is no spinal stenosis or convincing lumbar neural
impingement. In the lower lumbar spine there is up to mild stenosis
at the level of the left L4 and bilateral L5 nerves.

## 2019-01-14 DIAGNOSIS — L578 Other skin changes due to chronic exposure to nonionizing radiation: Secondary | ICD-10-CM | POA: Diagnosis not present

## 2019-01-21 ENCOUNTER — Other Ambulatory Visit: Payer: Self-pay | Admitting: Internal Medicine

## 2019-01-21 DIAGNOSIS — Z1231 Encounter for screening mammogram for malignant neoplasm of breast: Secondary | ICD-10-CM

## 2019-03-04 ENCOUNTER — Encounter: Payer: Self-pay | Admitting: Internal Medicine

## 2019-03-04 ENCOUNTER — Ambulatory Visit
Admission: RE | Admit: 2019-03-04 | Discharge: 2019-03-04 | Disposition: A | Discharge status: Home / Self Care | Payer: Medicare Other | Attending: Internal Medicine | Admitting: Internal Medicine

## 2019-03-04 ENCOUNTER — Ambulatory Visit
Admission: RE | Admit: 2019-03-04 | Discharge: 2019-03-04 | Disposition: A | Discharge status: Home / Self Care | Payer: Medicare Other | Source: Ambulatory Visit | Attending: Internal Medicine | Admitting: Internal Medicine

## 2019-03-04 ENCOUNTER — Other Ambulatory Visit: Payer: Self-pay

## 2019-03-04 ENCOUNTER — Ambulatory Visit (INDEPENDENT_AMBULATORY_CARE_PROVIDER_SITE_OTHER): Payer: Medicare Other | Admitting: Internal Medicine

## 2019-03-04 VITALS — BP 126/78 | HR 74 | Ht 66.0 in | Wt 148.0 lb

## 2019-03-04 DIAGNOSIS — M79671 Pain in right foot: Secondary | ICD-10-CM

## 2019-03-04 DIAGNOSIS — M7731 Calcaneal spur, right foot: Secondary | ICD-10-CM | POA: Diagnosis not present

## 2019-03-04 DIAGNOSIS — M2011 Hallux valgus (acquired), right foot: Secondary | ICD-10-CM | POA: Diagnosis not present

## 2019-03-04 DIAGNOSIS — M19071 Primary osteoarthritis, right ankle and foot: Secondary | ICD-10-CM | POA: Diagnosis not present

## 2019-03-04 DIAGNOSIS — S92351D Displaced fracture of fifth metatarsal bone, right foot, subsequent encounter for fracture with routine healing: Secondary | ICD-10-CM | POA: Diagnosis not present

## 2019-03-04 NOTE — Progress Notes (Signed)
Date:  03/04/2019   Name:  Valerie Raymond   DOB:  03-24-1951   MRN:  [id number]   Chief Complaint: Foot Pain (R) foot pain. Been painful for 2 months. Getting progressively worse. No swelling or redness. Painful to walk in .)  Foot Pain This is a recurrent problem. The current episode started more than 1 month ago. The problem occurs constantly. The problem has been gradually worsening. Associated symptoms include arthralgias. Pertinent negatives include no chest pain, chills, coughing, fatigue, fever or joint swelling. The symptoms are aggravated by walking. She has tried NSAIDs (seen by podiatry in 2017 for stress fracture) for the symptoms. Improvement on treatment: could not tolerate gabapentin and lyrica.    Review of Systems  Constitutional: Negative for chills, fatigue and fever.  Respiratory: Negative for cough, chest tightness, shortness of breath and wheezing.   Cardiovascular: Negative for chest pain, palpitations and leg swelling.  Musculoskeletal: Positive for arthralgias and gait problem. Negative for joint swelling.    Patient Active Problem List   Diagnosis Date Noted  . Hyperlipidemia, mild 05/22/2018  . Pessary maintenance 04/09/2018  . Stricture and stenosis of esophagus   . Lumbar pain with radiation down both legs 06/05/2017  . Dysphagia 03/21/2017  . Bursitis of both hips 12/27/2016  . Tonsillith 10/09/2016  . Family history of breast cancer in first degree relative 01/02/2016  . Status post total abdominal hysterectomy and bilateral salpingo-oophorectomy (TAH-BSO) 01/02/2016  . Vaginal atrophy 01/02/2016  . Incomplete bladder emptying 01/02/2016  . Midline cystocele 01/02/2016  . Colon, diverticulosis 07/23/2015  . Arthritis of knee, degenerative 07/23/2015  . Other osteoporosis without current pathological fracture 07/23/2015  . Avitaminosis D 07/23/2015    Allergies  Allergen Reactions  . Lodine [Etodolac] Diarrhea and Nausea And Vomiting     Past Surgical History:  Procedure Laterality Date  . ABDOMINAL HYSTERECTOMY  1992  . COLONOSCOPY  2006  . COLONOSCOPY N/A 01/23/2015   Procedure: COLONOSCOPY;  Surgeon: Lucilla Lame, MD;  Location: [facility];  Service: Gastroenterology;  Laterality: N/A;  cecum- [id number]  . ESOPHAGEAL DILATION  04/21/2017   Procedure: ESOPHAGEAL DILATION;  Surgeon: Lucilla Lame, MD;  Location: [facility];  Service: Endoscopy;;  . ESOPHAGEAL DILATION  07/07/2017   Procedure: ESOPHAGEAL DILATION;  Surgeon: Lucilla Lame, MD;  Location: [place];  Service: Endoscopy;;  . ESOPHAGEAL DILATION N/A 07/28/2017   Procedure: ESOPHAGEAL DILATION;  Surgeon: Lucilla Lame, MD;  Location: [place] [facility];  Service: Endoscopy;  Laterality: N/A;  . ESOPHAGOGASTRODUODENOSCOPY N/A 04/21/2017   Procedure: ESOPHAGOGASTRODUODENOSCOPY (EGD);  Surgeon: Lucilla Lame, MD;  Location: [facility];  Service: Endoscopy;  Laterality: N/A;  . ESOPHAGOGASTRODUODENOSCOPY (EGD) WITH PROPOFOL N/A 07/07/2017   Procedure: ESOPHAGOGASTRODUODENOSCOPY (EGD) WITH PROPOFOL;  Surgeon: Lucilla Lame, MD;  Location: [facility];  Service: Endoscopy;  Laterality: N/A;  . ESOPHAGOGASTRODUODENOSCOPY (EGD) WITH PROPOFOL N/A 07/28/2017   Procedure: ESOPHAGOGASTRODUODENOSCOPY (EGD) WITH PROPOFOL;  Surgeon: Lucilla Lame, MD;  Location: [place];  Service: Endoscopy;  Laterality: N/A;  . SALPINGOOPHORECTOMY Bilateral     Social History   Tobacco Use  . Smoking status: Never Smoker  . Smokeless tobacco: Never Used  . Tobacco comment: smoking cessation materials not required  Substance Use Topics  . Alcohol use: No    Alcohol/week: 0.0 standard drinks  . Drug use: No     Medication list has been reviewed and updated.  Current Meds  Medication Sig  . alendronate (FOSAMAX) 70 MG  tablet TAKE 1 TABLET BY MOUTH ONCE A WEEK WITH A GLASS OF WATER ON A EMPTY STOMACH AND REMAIN UPRIGHT WITHOUT EATING FOR 30  MINUTES.  . Calcium Carbonate-Vit D-Min (CALCIUM 1200 PO) Take by mouth.  . Cholecalciferol (VITAMIN D3) 1000 UNITS CAPS Take by mouth. am  . meloxicam (MOBIC) 15 MG tablet Take 1 tablet (15 mg total) by mouth daily.  . pantoprazole (PROTONIX) 40 MG tablet Take 1 tablet (40 mg total) by mouth daily. am  . PREMARIN vaginal cream INSERT 1/2 GRAM VAGINALLY 2 TIMES A WEEK    PHQ 2/9 Scores 03/04/2019 03/26/2018 03/23/2018 03/17/2017  PHQ - 2 Score 0 0 0 0  PHQ- 9 Score - 0 0 -    BP Readings from Last 3 Encounters:  03/04/19 126/78  04/09/18 (!) 160/93  03/26/18 (!) 144/80    Physical Exam Vitals signs and nursing note reviewed.  Constitutional:      General: She is not in acute distress.    Appearance: Normal appearance. She is well-developed.  HENT:     Head: Normocephalic and atraumatic.  Cardiovascular:     Rate and Rhythm: Normal rate and regular rhythm.     Pulses:          Dorsalis pedis pulses are 2+ on the right side and 2+ on the left side.       Posterior tibial pulses are 2+ on the right side and 2+ on the left side.     Heart sounds: Normal heart sounds.  Pulmonary:     Effort: Pulmonary effort is normal. No respiratory distress.  Musculoskeletal: Normal range of motion.     Right lower leg: No edema.     Left lower leg: No edema.       Feet:  Skin:    General: Skin is warm and dry.     Findings: No rash.  Neurological:     Mental Status: She is alert and oriented to person, place, and time.  Psychiatric:        Attention and Perception: Attention normal.        Mood and Affect: Mood normal.        Behavior: Behavior normal.        Thought Content: Thought content normal.     Wt Readings from Last 3 Encounters:  03/04/19 148 lb (67.1 kg)  04/09/18 147 lb 12.8 oz (67 kg)  03/26/18 147 lb (66.7 kg)    BP 126/78   Pulse 74   Ht 5\' 6"  (1.676 m)   Wt 148 lb (67.1 kg)   SpO2 99%   BMI 23.89 kg/m   Assessment and Plan: 1. Foot pain, right Hx of stress  fracture - feels similar Begin Mobic daily Pt would like to see podiatry if no improvement with Mobic (would like to see someone other than Dr. Cleda Mccreedy)  - DG Foot Complete Right; Future   Partially dictated using Dragon software. Any errors are unintentional.  Halina Maidens, MD [facility]  03/04/2019

## 2019-03-08 ENCOUNTER — Telehealth: Payer: Self-pay

## 2019-03-08 ENCOUNTER — Telehealth: Payer: Self-pay | Admitting: Internal Medicine

## 2019-03-08 NOTE — Telephone Encounter (Signed)
Called and informed patient. Thank you!

## 2019-03-08 NOTE — Telephone Encounter (Signed)
No need to wear the boot.

## 2019-03-08 NOTE — Telephone Encounter (Signed)
Pt informed

## 2019-03-08 NOTE — Telephone Encounter (Signed)
Pt called wanting to know xray results, please advise

## 2019-03-08 NOTE — Telephone Encounter (Signed)
Patient informed of foot Xray. She wanted to know since her foot fracture is now healed, does she need to continue to wear the boot on her foot?  Please Advise.  ( call home back )

## 2019-03-29 ENCOUNTER — Other Ambulatory Visit: Payer: Self-pay

## 2019-03-29 ENCOUNTER — Ambulatory Visit (INDEPENDENT_AMBULATORY_CARE_PROVIDER_SITE_OTHER): Payer: Medicare Other

## 2019-03-29 VITALS — BP 132/78 | HR 70 | Temp 97.8°F | Resp 16 | Ht 66.0 in | Wt 149.0 lb

## 2019-03-29 DIAGNOSIS — Z0000 Encounter for general adult medical examination without abnormal findings: Secondary | ICD-10-CM

## 2019-03-29 NOTE — Progress Notes (Signed)
Subjective:   Valerie Raymond is a 68 y.o. female who presents for Medicare Annual (Subsequent) preventive examination.  Review of Systems:   Cardiac Risk Factors include: advanced age (>64men, >65 women)     Objective:     Vitals: BP 132/78 (BP Location: Left Arm, Patient Position: Sitting, Cuff Size: Normal)   Pulse 70   Temp 97.8 F (36.6 C) (Oral)   Resp 16   Ht 5\' 6"  (1.676 m)   Wt 149 lb (67.6 kg)   SpO2 98%   BMI 24.05 kg/m   Body mass index is 24.05 kg/m.  Advanced Directives 03/29/2019 03/23/2018 07/28/2017 07/07/2017 04/21/2017 03/17/2017 03/13/2016  Does Patient Have a Medical Advance Directive? No No No No No Yes;No No  Would patient like information on creating a medical advance directive? No - Patient declined Yes (MAU/Ambulatory/Procedural Areas - Information given) No - Patient declined No - Patient declined No - Patient declined - -    Tobacco Social History   Tobacco Use  Smoking Status Never Smoker  Smokeless Tobacco Never Used  Tobacco Comment   smoking cessation materials not required     Counseling given: Not Answered Comment: smoking cessation materials not required   Clinical Intake:  Pre-visit preparation completed: Yes  Pain : No/denies pain     BMI - recorded: 24.05 Nutritional Status: BMI of 19-24  Normal Nutritional Risks: None Diabetes: No  How often do you need to have someone help you when you read instructions, pamphlets, or other written materials from your doctor or pharmacy?: 1 - Never  Interpreter Needed?: No  Information entered by :: Clemetine Marker LPN  Past Medical History:  Diagnosis Date  . Arthralgia of hip 07/23/2015  . Arthritis    knee,hip,hands  . Degenerative disc disease, lumbar   . DJD (degenerative joint disease)   . Dysphagia    with solids and regurgitation  . Endometriosis    h/o  . GERD (gastroesophageal reflux disease)    occasional  . Heartburn   . Joint pain   . Osteoporosis    hip  . Pelvic  pressure in female 01/02/2016  . Rash    skin rash and itching  . Vitamin D deficiency    Past Surgical History:  Procedure Laterality Date  . ABDOMINAL HYSTERECTOMY  1992  . COLONOSCOPY  2006  . COLONOSCOPY N/A 01/23/2015   Procedure: COLONOSCOPY;  Surgeon: Lucilla Lame, MD;  Location: [facility];  Service: Gastroenterology;  Laterality: N/A;  cecum- [id number]  . ESOPHAGEAL DILATION  04/21/2017   Procedure: ESOPHAGEAL DILATION;  Surgeon: Lucilla Lame, MD;  Location: [place];  Service: Endoscopy;;  . ESOPHAGEAL DILATION  07/07/2017   Procedure: ESOPHAGEAL DILATION;  Surgeon: Lucilla Lame, MD;  Location: [place];  Service: Endoscopy;;  . ESOPHAGEAL DILATION N/A 07/28/2017   Procedure: ESOPHAGEAL DILATION;  Surgeon: Lucilla Lame, MD;  Location: [place] [place];  Service: Endoscopy;  Laterality: N/A;  . ESOPHAGOGASTRODUODENOSCOPY N/A 04/21/2017   Procedure: ESOPHAGOGASTRODUODENOSCOPY (EGD);  Surgeon: Lucilla Lame, MD;  Location: [place];  Service: Endoscopy;  Laterality: N/A;  . ESOPHAGOGASTRODUODENOSCOPY (EGD) WITH PROPOFOL N/A 07/07/2017   Procedure: ESOPHAGOGASTRODUODENOSCOPY (EGD) WITH PROPOFOL;  Surgeon: Lucilla Lame, MD;  Location: [place];  Service: Endoscopy;  Laterality: N/A;  . ESOPHAGOGASTRODUODENOSCOPY (EGD) WITH PROPOFOL N/A 07/28/2017   Procedure: ESOPHAGOGASTRODUODENOSCOPY (EGD) WITH PROPOFOL;  Surgeon: Lucilla Lame, MD;  Location: [facility];  Service: Endoscopy;  Laterality: N/A;  . SALPINGOOPHORECTOMY Bilateral  Family History  Problem Relation Age of Onset  . Pancreatic cancer Mother   . Breast cancer Sister 50  . Prostate cancer Father   . Breast cancer Maternal Aunt   . Breast cancer Cousin        pat cousin  . Breast cancer Sister 29  . Colon cancer Paternal Uncle   . Prostate cancer Brother   . Ovarian cancer Neg Hx   . Diabetes Neg Hx    Social History   Socioeconomic History  . Marital status:  Single    Spouse name: Not on file  . Number of children: 0  . Years of education: Not on file  . Highest education level: Associate degree: academic program  Occupational History    Employer: [employer]  Social Needs  . Financial resource strain: Not hard at all  . Food insecurity    Worry: Never true    Inability: Never true  . Transportation needs    Medical: No    Non-medical: No  Tobacco Use  . Smoking status: Never Smoker  . Smokeless tobacco: Never Used  . Tobacco comment: smoking cessation materials not required  Substance and Sexual Activity  . Alcohol use: No    Alcohol/week: 0.0 standard drinks  . Drug use: No  . Sexual activity: Not Currently    Birth control/protection: Surgical  Lifestyle  . Physical activity    Days per week: 0 days    Minutes per session: 0 min  . Stress: Not at all  Relationships  . Social connections    Talks on phone: More than three times a week    Gets together: More than three times a week    Attends religious service: Never    Active member of club or organization: No    Attends meetings of clubs or organizations: Never    Relationship status: Never married  Other Topics Concern  . Not on file  Social History Narrative  . Not on file    Outpatient Encounter Medications as of 03/29/2019  Medication Sig  . alendronate (FOSAMAX) 70 MG tablet TAKE 1 TABLET BY MOUTH ONCE A WEEK WITH A GLASS OF WATER ON A EMPTY STOMACH AND REMAIN UPRIGHT WITHOUT EATING FOR 30 MINUTES.  . Calcium Carbonate-Vit D-Min (CALCIUM 1200 PO) Take by mouth.  . Cholecalciferol (VITAMIN D3) 1000 UNITS CAPS Take by mouth. am  . meloxicam (MOBIC) 15 MG tablet Take 1 tablet (15 mg total) by mouth daily.  . pantoprazole (PROTONIX) 40 MG tablet Take 1 tablet (40 mg total) by mouth daily. am  . PREMARIN vaginal cream INSERT 1/2 GRAM VAGINALLY 2 TIMES A WEEK   No facility-administered encounter medications on file as of 03/29/2019.     Activities of  Daily Living In your present state of health, do you have any difficulty performing the following activities: 03/29/2019  Hearing? N  Comment declines hearing aids  Vision? N  Difficulty concentrating or making decisions? N  Walking or climbing stairs? N  Dressing or bathing? N  Doing errands, shopping? N  Preparing Food and eating ? N  Using the Toilet? N  In the past six months, have you accidently leaked urine? N  Do you have problems with loss of bowel control? N  Managing your Medications? N  Managing your Finances? N  Housekeeping or managing your Housekeeping? N  Some recent data might be hidden    Patient Care Team: [name], MD as PCP - General (  Family Medicine) Defrancesco, Alanda Slim, MD as Consulting Physician (Obstetrics and Gynecology) Jannet Mantis, MD as Consulting Physician (Dermatology)    Assessment:   This is a routine wellness examination for [place].  Exercise Activities and Dietary recommendations Current Exercise Habits: The patient does not participate in regular exercise at present, Exercise limited by: None identified  Goals    . DIET - INCREASE WATER INTAKE     Recommend to drink at least 6-8 8oz glasses of water per day.    . Increase physical activity     Pt would like to be able to get back to the gym once opened, she was going 4 days per week prior to covid-19.    . Weight < 200 lb (90.719 kg)       Fall Risk Fall Risk  03/29/2019 03/04/2019 03/26/2018 03/23/2018 03/17/2017  Falls in the past year? 0 0 Yes Yes No  Number falls in past yr: 0 0 1 1 -  Injury with Fall? 0 0 No No -  Risk for fall due to : - - - Impaired vision -  Risk for fall due to: Comment - - - wears eyeglasses -  Follow up Falls prevention discussed Falls evaluation completed - Falls evaluation completed;Education provided;Falls prevention discussed -   FALL RISK PREVENTION PERTAINING TO THE HOME:  Any stairs in or around the home? Yes  If so, do they  handrails? Yes   Home free of loose throw rugs in walkways, pet beds, electrical cords, etc? Yes  Adequate lighting in your home to reduce risk of falls? Yes   ASSISTIVE DEVICES UTILIZED TO PREVENT FALLS:  Life alert? No  Use of a cane, walker or w/c? No  Grab bars in the bathroom? No  Shower chair or bench in shower? Yes  Elevated toilet seat or a handicapped toilet? No   DME ORDERS:  DME order needed?  No   TIMED UP AND GO:  Was the test performed? Yes .  Length of time to ambulate 10 feet: 5 sec.   GAIT:  Appearance of gait: Gait stead-fast and without the use of an assistive device.   Education: Fall risk prevention has been discussed.  Intervention(s) required? No    Depression Screen PHQ 2/9 Scores 03/29/2019 03/04/2019 03/26/2018 03/23/2018  PHQ - 2 Score 0 0 0 0  PHQ- 9 Score - - 0 0     Cognitive Function     6CIT Screen 03/29/2019 03/23/2018 03/17/2017  What Year? 0 points 0 points 0 points  What month? 0 points 0 points 0 points  What time? 0 points 0 points 0 points  Count back from 20 0 points 0 points 0 points  Months in reverse 0 points 0 points 0 points  Repeat phrase 0 points 2 points 2 points  Total Score 0 2 2    Immunization History  Administered Date(s) Administered  . Influenza-Unspecified 05/23/2015, 06/01/2018  . Pneumococcal Conjugate-13 03/17/2017  . Pneumococcal Polysaccharide-23 03/23/2018  . Tdap 03/13/2016  . Zoster 08/19/2011  . Zoster Recombinat (Shingrix) 08/31/2018, 11/18/2018    Qualifies for Shingles Vaccine? Yes  Shingrix series completed.   Tdap: Up to date  Flu Vaccine: Up to date  Pneumococcal Vaccine: Up to date    Screening Tests Health Maintenance  Topic Date Due  . MAMMOGRAM  01/20/2019  . INFLUENZA VACCINE  04/17/2019  . COLONOSCOPY  01/22/2025  . TETANUS/TDAP  03/13/2026  . DEXA SCAN  Completed  . Hepatitis C  Screening  Completed  . PNA vac Low Risk Adult  Completed    Cancer Screenings:  Colorectal  Screening: Completed 01/23/15. Repeat every 10 years;   Mammogram: Completed 01/19/18. Repeat every year; scheduled for 04/05/19  Bone Density: Completed 04/13/18. Results reflect OSTEOPENIA. Repeat every 2 years.   Lung Cancer Screening: (Low Dose CT Chest recommended if Age 27-80 years, 30 pack-year currently smoking OR have quit w/in 15years.) does not qualify.   Additional Screening:  Hepatitis C Screening: does qualify; Completed 03/13/16  Vision Screening: Recommended annual ophthalmology exams for early detection of glaucoma and other disorders of the eye. Is the patient up to date with their annual eye exam?  Yes  Who is the provider or what is the name of the office in which the pt attends annual eye exams? [facility] Screening: Recommended annual dental exams for proper oral hygiene  Community Resource Referral:  CRR required this visit?  No      Plan:     I have personally reviewed and addressed the Medicare Annual Wellness questionnaire and have noted the following in the patient's chart:  A. Medical and social history B. Use of alcohol, tobacco or illicit drugs  C. Current medications and supplements D. Functional ability and status E.  Nutritional status F.  Physical activity G. Advance directives H. List of other physicians I.  Hospitalizations, surgeries, and ER visits in previous 12 months J.  [place] such as hearing and vision if needed, cognitive and depression L. Referrals and appointments   In addition, I have reviewed and discussed with patient certain preventive protocols, quality metrics, and best practice recommendations. A written personalized care plan for preventive services as well as general preventive health recommendations were provided to patient.   Signed,  Clemetine Marker, LPN Nurse Health Advisor   Nurse Notes: pt doing well and appreciative of visit today

## 2019-03-29 NOTE — Patient Instructions (Addendum)
[name] , Thank you for taking time to come for your Medicare Wellness Visit. I appreciate your ongoing commitment to your health goals. Please review the following plan we discussed and let me know if I can assist you in the future.   Screening recommendations/referrals: Colonoscopy: done 01/23/15. Repeat in 2026. Mammogram: done 01/19/18. Scheduled for 04/05/19. Bone Density: done 04/13/18 Recommended yearly ophthalmology/optometry visit for glaucoma screening and checkup Recommended yearly dental visit for hygiene and checkup  Vaccinations: Influenza vaccine: Up to date  Pneumococcal vaccine: done 03/23/18 Tdap vaccine: done 03/13/16 Shingles vaccine: Shingrix series completed.     Advanced directives: Please bring a copy of your health care power of attorney and living will to the office at your convenience once you have completed those documents.   Conditions/risks identified: Recommend increasing physical activity to 150 minutes per week.   Next appointment: Please follow up in one year for your Medicare Annual Wellness visit.     Preventive Care 68 Years and Older, Female Preventive care refers to lifestyle choices and visits with your health care provider that can promote health and wellness. What does preventive care include?  A yearly physical exam. This is also called an annual well check.  Dental exams once or twice a year.  Routine eye exams. Ask your health care provider how often you should have your eyes checked.  Personal lifestyle choices, including:  Daily care of your teeth and gums.  Regular physical activity.  Eating a healthy diet.  Avoiding tobacco and drug use.  Limiting alcohol use.  Practicing safe sex.  Taking low-dose aspirin every day.  Taking vitamin and mineral supplements as recommended by your health care provider. What happens during an annual well check? The services and screenings done by your health care provider during your annual  well check will depend on your age, overall health, lifestyle risk factors, and family history of disease. Counseling  Your health care provider may ask you questions about your:  Alcohol use.  Tobacco use.  Drug use.  Emotional well-being.  Home and relationship well-being.  Sexual activity.  Eating habits.  History of falls.  Memory and ability to understand (cognition).  Work and work Statistician.  Reproductive health. Screening  You may have the following tests or measurements:  Height, weight, and BMI.  Blood pressure.  Lipid and cholesterol levels. These may be checked every 5 years, or more frequently if you are over 68 years old.  Skin check.  Lung cancer screening. You may have this screening every year starting at age 68 if you have a 30-pack-year history of smoking and currently smoke or have quit within the past 15 years.  Fecal occult blood test (FOBT) of the stool. You may have this test every year starting at age 68.  Flexible sigmoidoscopy or colonoscopy. You may have a sigmoidoscopy every 5 years or a colonoscopy every 10 years starting at age 68.  Hepatitis C blood test.  Hepatitis B blood test.  Sexually transmitted disease (STD) testing.  Diabetes screening. This is done by checking your blood sugar (glucose) after you have not eaten for a while (fasting). You may have this done every 1-3 years.  Bone density scan. This is done to screen for osteoporosis. You may have this done starting at age 68.  Mammogram. This may be done every 1-2 years. Talk to your health care provider about how often you should have regular mammograms. Talk with your health care provider about your test results,  treatment options, and if necessary, the need for more tests. Vaccines  Your health care provider may recommend certain vaccines, such as:  Influenza vaccine. This is recommended every year.  Tetanus, diphtheria, and acellular pertussis (Tdap, Td) vaccine.  You may need a Td booster every 10 years.  Zoster vaccine. You may need this after age 50.  Pneumococcal 13-valent conjugate (PCV13) vaccine. One dose is recommended after age 68.  Pneumococcal polysaccharide (PPSV23) vaccine. One dose is recommended after age 68. Talk to your health care provider about which screenings and vaccines you need and how often you need them. This information is not intended to replace advice given to you by your health care provider. Make sure you discuss any questions you have with your health care provider. Document Released: 09/29/2015 Document Revised: 05/22/2016 Document Reviewed: 07/04/2015 Elsevier Interactive Patient Education  2017 [employer] Prevention in the Home Falls can cause injuries. They can happen to people of all ages. There are many things you can do to make your home safe and to help prevent falls. What can I do on the outside of my home?  Regularly fix the edges of walkways and driveways and fix any cracks.  Remove anything that might make you trip as you walk through a door, such as a raised step or threshold.  Trim any bushes or trees on the path to your home.  Use bright outdoor lighting.  Clear any walking paths of anything that might make someone trip, such as rocks or tools.  Regularly check to see if handrails are loose or broken. Make sure that both sides of any steps have handrails.  Any raised decks and porches should have guardrails on the edges.  Have any leaves, snow, or ice cleared regularly.  Use sand or salt on walking paths during winter.  Clean up any spills in your garage right away. This includes oil or grease spills. What can I do in the bathroom?  Use night lights.  Install grab bars by the toilet and in the tub and shower. Do not use towel bars as grab bars.  Use non-skid mats or decals in the tub or shower.  If you need to sit down in the shower, use a plastic, non-slip stool.  Keep the  floor dry. Clean up any water that spills on the floor as soon as it happens.  Remove soap buildup in the tub or shower regularly.  Attach bath mats securely with double-sided non-slip rug tape.  Do not have throw rugs and other things on the floor that can make you trip. What can I do in the bedroom?  Use night lights.  Make sure that you have a light by your bed that is easy to reach.  Do not use any sheets or blankets that are too big for your bed. They should not hang down onto the floor.  Have a firm chair that has side arms. You can use this for support while you get dressed.  Do not have throw rugs and other things on the floor that can make you trip. What can I do in the kitchen?  Clean up any spills right away.  Avoid walking on wet floors.  Keep items that you use a lot in easy-to-reach places.  If you need to reach something above you, use a strong step stool that has a grab bar.  Keep electrical cords out of the way.  Do not use floor polish or wax that makes floors  slippery. If you must use wax, use non-skid floor wax.  Do not have throw rugs and other things on the floor that can make you trip. What can I do with my stairs?  Do not leave any items on the stairs.  Make sure that there are handrails on both sides of the stairs and use them. Fix handrails that are broken or loose. Make sure that handrails are as long as the stairways.  Check any carpeting to make sure that it is firmly attached to the stairs. Fix any carpet that is loose or worn.  Avoid having throw rugs at the top or bottom of the stairs. If you do have throw rugs, attach them to the floor with carpet tape.  Make sure that you have a light switch at the top of the stairs and the bottom of the stairs. If you do not have them, ask someone to add them for you. What else can I do to help prevent falls?  Wear shoes that:  Do not have high heels.  Have rubber bottoms.  Are comfortable and fit  you well.  Are closed at the toe. Do not wear sandals.  If you use a stepladder:  Make sure that it is fully opened. Do not climb a closed stepladder.  Make sure that both sides of the stepladder are locked into place.  Ask someone to hold it for you, if possible.  Clearly mark and make sure that you can see:  Any grab bars or handrails.  First and last steps.  Where the edge of each step is.  Use tools that help you move around (mobility aids) if they are needed. These include:  Canes.  Walkers.  Scooters.  Crutches.  Turn on the lights when you go into a dark area. Replace any light bulbs as soon as they burn out.  Set up your furniture so you have a clear path. Avoid moving your furniture around.  If any of your floors are uneven, fix them.  If there are any pets around you, be aware of where they are.  Review your medicines with your doctor. Some medicines can make you feel dizzy. This can increase your chance of falling. Ask your doctor what other things that you can do to help prevent falls. This information is not intended to replace advice given to you by your health care provider. Make sure you discuss any questions you have with your health care provider. Document Released: 06/29/2009 Document Revised: 02/08/2016 Document Reviewed: 10/07/2014 Elsevier Interactive Patient Education  2017 [employer].

## 2019-04-01 ENCOUNTER — Encounter: Payer: Medicare Other | Admitting: Internal Medicine

## 2019-04-05 ENCOUNTER — Ambulatory Visit
Admission: RE | Admit: 2019-04-05 | Discharge: 2019-04-05 | Disposition: A | Discharge status: Home / Self Care | Payer: Medicare Other | Source: Ambulatory Visit | Attending: Internal Medicine | Admitting: Internal Medicine

## 2019-04-05 ENCOUNTER — Other Ambulatory Visit: Payer: Self-pay

## 2019-04-05 DIAGNOSIS — Z1231 Encounter for screening mammogram for malignant neoplasm of breast: Secondary | ICD-10-CM | POA: Diagnosis not present

## 2019-04-06 ENCOUNTER — Encounter: Payer: Self-pay | Admitting: Internal Medicine

## 2019-04-06 ENCOUNTER — Ambulatory Visit (INDEPENDENT_AMBULATORY_CARE_PROVIDER_SITE_OTHER): Payer: Medicare Other | Admitting: Internal Medicine

## 2019-04-06 VITALS — BP 120/68 | HR 60 | Ht 66.0 in | Wt 148.0 lb

## 2019-04-06 DIAGNOSIS — Z0000 Encounter for general adult medical examination without abnormal findings: Secondary | ICD-10-CM | POA: Diagnosis not present

## 2019-04-06 DIAGNOSIS — M79671 Pain in right foot: Secondary | ICD-10-CM | POA: Diagnosis not present

## 2019-04-06 DIAGNOSIS — K222 Esophageal obstruction: Secondary | ICD-10-CM | POA: Diagnosis not present

## 2019-04-06 DIAGNOSIS — K219 Gastro-esophageal reflux disease without esophagitis: Secondary | ICD-10-CM | POA: Diagnosis not present

## 2019-04-06 DIAGNOSIS — M818 Other osteoporosis without current pathological fracture: Secondary | ICD-10-CM

## 2019-04-06 DIAGNOSIS — R1310 Dysphagia, unspecified: Secondary | ICD-10-CM | POA: Diagnosis not present

## 2019-04-06 DIAGNOSIS — E785 Hyperlipidemia, unspecified: Secondary | ICD-10-CM | POA: Diagnosis not present

## 2019-04-06 LAB — POCT URINALYSIS DIPSTICK
Bilirubin, UA: NEGATIVE
Blood, UA: NEGATIVE
Glucose, UA: NEGATIVE
Ketones, UA: NEGATIVE
Leukocytes, UA: NEGATIVE
Nitrite, UA: NEGATIVE
Protein, UA: NEGATIVE
Spec Grav, UA: <=1.005 — AB (ref 1.010–1.025)
Urobilinogen, UA: 0.2 E.U./dL
pH, UA: 5 (ref 5.0–8.0)

## 2019-04-06 NOTE — Progress Notes (Signed)
Date:  04/06/2019   Name:  Valerie Raymond   DOB:  [birth year]   MRN:  [id number]   Chief Complaint: Annual Exam (Breast Exam. ) Valerie Raymond is a 68 y.o. female who presents today for her Complete Annual Exam. She feels fairly well. She reports exercising some. She reports she is sleeping well.   Mammogram done 03/2019 Colonoscopy 01/2015 Immunizations up to date DEXA 03/2018  Gastroesophageal Reflux She reports no abdominal pain, no chest pain, no coughing or no wheezing. This is a recurrent problem. The problem occurs rarely. Pertinent negatives include no fatigue. She has tried a PPI for the symptoms. The treatment provided significant relief. Past procedures include an EGD.  OP - pt continues on Fosamax weekly and calcium + D.  Last DEXA done 03/2018. Foot pain - she has significant relief with Mobic daily.  She continues to work full time on her feet at [employer].  We discussed taking Mobic as needed from now on. Back pain - this continues but is not severe.  She was exercising at the gym and feeling better but since the gym closed she has not been able to do the same exercise.  She may get an exercise bike to have at home.  Review of Systems  Constitutional: Negative for chills, fatigue and fever.  HENT: Negative for congestion, hearing loss, tinnitus, trouble swallowing and voice change.   Eyes: Negative for visual disturbance.  Respiratory: Negative for cough, chest tightness, shortness of breath and wheezing.   Cardiovascular: Negative for chest pain, palpitations and leg swelling.  Gastrointestinal: Negative for abdominal pain, constipation, diarrhea and vomiting.  Endocrine: Negative for polydipsia and polyuria.  Genitourinary: Negative for difficulty urinating (has pessary), dysuria, frequency, genital sores, vaginal bleeding and vaginal discharge.  Musculoskeletal: Positive for arthralgias (right foot much improved) and back pain (mild, chronic). Negative for gait  problem and joint swelling.  Skin: Negative for color change and rash.  Neurological: Negative for dizziness, tremors, light-headedness and headaches.  Hematological: Negative for adenopathy. Does not bruise/bleed easily.  Psychiatric/Behavioral: Negative for dysphoric mood and sleep disturbance. The patient is not nervous/anxious.     Patient Active Problem List   Diagnosis Date Noted  . Hyperlipidemia, mild 05/22/2018  . Pessary maintenance 04/09/2018  . Lumbar radiculopathy 07/16/2017  . Stricture and stenosis of esophagus   . Lumbar pain with radiation down both legs 06/05/2017  . Dysphagia 03/21/2017  . Bursitis of both hips 12/27/2016  . Tonsillith 10/09/2016  . Family history of breast cancer in first degree relative 01/02/2016  . Status post total abdominal hysterectomy and bilateral salpingo-oophorectomy (TAH-BSO) 01/02/2016  . Vaginal atrophy 01/02/2016  . Incomplete bladder emptying 01/02/2016  . Midline cystocele 01/02/2016  . Colon, diverticulosis 07/23/2015  . Arthritis of knee, degenerative 07/23/2015  . Other osteoporosis without current pathological fracture 07/23/2015  . Avitaminosis D 07/23/2015    Allergies  Allergen Reactions  . Lodine [Etodolac] Diarrhea and Nausea And Vomiting    Past Surgical History:  Procedure Laterality Date  . ABDOMINAL HYSTERECTOMY  1992  . COLONOSCOPY  2006  . COLONOSCOPY N/A 01/23/2015   Procedure: COLONOSCOPY;  Surgeon: Lucilla Lame, MD;  Location: [facility];  Service: Gastroenterology;  Laterality: N/A;  cecum- [id number]  . ESOPHAGEAL DILATION  04/21/2017   Procedure: ESOPHAGEAL DILATION;  Surgeon: Lucilla Lame, MD;  Location: [facility];  Service: Endoscopy;;  . ESOPHAGEAL DILATION  07/07/2017   Procedure: ESOPHAGEAL DILATION;  Surgeon: Lucilla Lame, MD;  Location: [facility];  Service: Endoscopy;;  . ESOPHAGEAL DILATION N/A 07/28/2017   Procedure: ESOPHAGEAL DILATION;  Surgeon: Lucilla Lame, MD;   Location: [facility];  Service: Endoscopy;  Laterality: N/A;  . ESOPHAGOGASTRODUODENOSCOPY N/A 04/21/2017   Procedure: ESOPHAGOGASTRODUODENOSCOPY (EGD);  Surgeon: Lucilla Lame, MD;  Location: [place] [facility];  Service: Endoscopy;  Laterality: N/A;  . ESOPHAGOGASTRODUODENOSCOPY (EGD) WITH PROPOFOL N/A 07/07/2017   Procedure: ESOPHAGOGASTRODUODENOSCOPY (EGD) WITH PROPOFOL;  Surgeon: Lucilla Lame, MD;  Location: [facility];  Service: Endoscopy;  Laterality: N/A;  . ESOPHAGOGASTRODUODENOSCOPY (EGD) WITH PROPOFOL N/A 07/28/2017   Procedure: ESOPHAGOGASTRODUODENOSCOPY (EGD) WITH PROPOFOL;  Surgeon: Lucilla Lame, MD;  Location: [facility];  Service: Endoscopy;  Laterality: N/A;  . SALPINGOOPHORECTOMY Bilateral     Social History   Tobacco Use  . Smoking status: Never Smoker  . Smokeless tobacco: Never Used  . Tobacco comment: smoking cessation materials not required  Substance Use Topics  . Alcohol use: No    Alcohol/week: 0.0 standard drinks  . Drug use: No     Medication list has been reviewed and updated.  Current Meds  Medication Sig  . alendronate (FOSAMAX) 70 MG tablet TAKE 1 TABLET BY MOUTH ONCE A WEEK WITH A GLASS OF WATER ON A EMPTY STOMACH AND REMAIN UPRIGHT WITHOUT EATING FOR 30 MINUTES.  . Calcium Carbonate-Vit D-Min (CALCIUM 1200 PO) Take by mouth.  . Cholecalciferol (VITAMIN D3) 1000 UNITS CAPS Take by mouth. am  . meloxicam (MOBIC) 15 MG tablet Take 1 tablet (15 mg total) by mouth daily.  . pantoprazole (PROTONIX) 40 MG tablet Take 1 tablet (40 mg total) by mouth daily. am  . PREMARIN vaginal cream INSERT 1/2 GRAM VAGINALLY 2 TIMES A WEEK    PHQ 2/9 Scores 04/06/2019 03/29/2019 03/04/2019 03/26/2018  PHQ - 2 Score 0 0 0 0  PHQ- 9 Score - - - 0    BP Readings from Last 3 Encounters:  04/06/19 120/68  03/29/19 132/78  03/04/19 126/78    Physical Exam Vitals signs and nursing note reviewed.  Constitutional:      General: She is not in  acute distress.    Appearance: She is well-developed.  HENT:     Head: Normocephalic and atraumatic.     Right Ear: Tympanic membrane and ear canal normal.     Left Ear: Tympanic membrane and ear canal normal.     Nose:     Right Sinus: No maxillary sinus tenderness.     Left Sinus: No maxillary sinus tenderness.  Eyes:     General: No scleral icterus.       Right eye: No discharge.        Left eye: No discharge.     Conjunctiva/sclera: Conjunctivae normal.  Neck:     Musculoskeletal: Normal range of motion. No erythema.     Thyroid: No thyromegaly.     Vascular: No carotid bruit.  Cardiovascular:     Rate and Rhythm: Normal rate and regular rhythm.     Pulses: Normal pulses.     Heart sounds: Normal heart sounds.  Pulmonary:     Effort: Pulmonary effort is normal. No respiratory distress.     Breath sounds: No wheezing.  Chest:     Breasts:        Right: No mass, nipple discharge, skin change or tenderness.        Left: No mass, nipple discharge, skin change or tenderness.  Abdominal:     General: Bowel sounds  are normal.     Palpations: Abdomen is soft.     Tenderness: There is no abdominal tenderness.  Musculoskeletal: Normal range of motion.  Lymphadenopathy:     Cervical: No cervical adenopathy.  Skin:    General: Skin is warm and dry.     Findings: No rash.  Neurological:     Mental Status: She is alert and oriented to person, place, and time.     Cranial Nerves: No cranial nerve deficit.     Sensory: No sensory deficit.     Deep Tendon Reflexes: Reflexes are normal and symmetric.  Psychiatric:        Attention and Perception: Attention normal.        Mood and Affect: Mood normal.        Speech: Speech normal.        Behavior: Behavior normal.        Thought Content: Thought content normal.     Wt Readings from Last 3 Encounters:  04/06/19 148 lb (67.1 kg)  03/29/19 149 lb (67.6 kg)  03/04/19 148 lb (67.1 kg)    BP 120/68   Pulse 60   Ht 5\' 6"  (1.676  m)   Wt 148 lb (67.1 kg)   SpO2 99%   BMI 23.89 kg/m   Assessment and Plan: 1. Annual physical exam Normal exam Continue healthy diet, exercise - CBC with Differential/Platelet - Comprehensive metabolic panel - Lipid panel - TSH - POCT urinalysis dipstick  2. Other osteoporosis without current pathological fracture Continue Fosamax, calcium + D DEXA next year  3. Gastroesophageal reflux disease, esophagitis presence not specified Controlled   4. Foot pain, right Much improved with Mobic   Partially dictated using Editor, commissioning. Any errors are unintentional.  Halina Maidens, MD [facility]  04/06/2019

## 2019-04-06 NOTE — Patient Instructions (Signed)

## 2019-04-07 LAB — COMPREHENSIVE METABOLIC PANEL
ALT: 20 IU/L (ref 0–32)
AST: 27 IU/L (ref 0–40)
Albumin/Globulin Ratio: 1.7 (ref 1.2–2.2)
Albumin: 4.6 g/dL (ref 3.8–4.8)
Alkaline Phosphatase: 93 IU/L (ref 39–117)
BUN/Creatinine Ratio: 14 (ref 12–28)
BUN: 12 mg/dL (ref 8–27)
Bilirubin Total: 0.4 mg/dL (ref 0.0–1.2)
CO2: 22 mmol/L (ref 20–29)
Calcium: 10.2 mg/dL (ref 8.7–10.3)
Chloride: 99 mmol/L (ref 96–106)
Creatinine, Ser: 0.83 mg/dL (ref 0.57–1.00)
GFR calc Af Amer: 84 mL/min/{1.73_m2} (ref >59)
GFR calc non Af Amer: 73 mL/min/{1.73_m2} (ref >59)
Globulin, Total: 2.7 g/dL (ref 1.5–4.5)
Glucose: 86 mg/dL (ref 65–99)
Potassium: 4.1 mmol/L (ref 3.5–5.2)
Sodium: 139 mmol/L (ref 134–144)
Total Protein: 7.3 g/dL (ref 6.0–8.5)

## 2019-04-07 LAB — CBC WITH DIFFERENTIAL/PLATELET
Basophils Absolute: 0.1 10*3/uL (ref 0.0–0.2)
Basos: 1 %
EOS (ABSOLUTE): 0.2 10*3/uL (ref 0.0–0.4)
Eos: 5 %
Hematocrit: 41.1 % (ref 34.0–46.6)
Hemoglobin: 13.6 g/dL (ref 11.1–15.9)
Immature Grans (Abs): 0 10*3/uL (ref 0.0–0.1)
Immature Granulocytes: 0 %
Lymphocytes Absolute: 0.7 10*3/uL (ref 0.7–3.1)
Lymphs: 13 %
MCH: 28.9 pg (ref 26.6–33.0)
MCHC: 33.1 g/dL (ref 31.5–35.7)
MCV: 87 fL (ref 79–97)
Monocytes Absolute: 0.5 10*3/uL (ref 0.1–0.9)
Monocytes: 10 %
Neutrophils Absolute: 3.7 10*3/uL (ref 1.4–7.0)
Neutrophils: 71 %
Platelets: 294 10*3/uL (ref 150–450)
RBC: 4.71 x10E6/uL (ref 3.77–5.28)
RDW: 12.6 % (ref 11.7–15.4)
WBC: 5.3 10*3/uL (ref 3.4–10.8)

## 2019-04-07 LAB — LIPID PANEL
Chol/HDL Ratio: 2.9 ratio (ref 0.0–4.4)
Cholesterol, Total: 179 mg/dL (ref 100–199)
HDL: 61 mg/dL (ref >39)
LDL Calculated: 99 mg/dL (ref 0–99)
Triglycerides: 94 mg/dL (ref 0–149)
VLDL Cholesterol Cal: 19 mg/dL (ref 5–40)

## 2019-04-07 LAB — TSH: TSH: 2.26 u[IU]/mL (ref 0.450–4.500)

## 2019-04-08 ENCOUNTER — Encounter: Payer: Medicare Other | Admitting: Obstetrics and Gynecology

## 2019-04-08 ENCOUNTER — Encounter: Payer: Self-pay | Admitting: Obstetrics and Gynecology

## 2019-04-14 NOTE — Progress Notes (Signed)
Pt is present for pessary maintenance. Pt stated that she is doing well no problems.

## 2019-04-15 ENCOUNTER — Other Ambulatory Visit: Payer: Self-pay

## 2019-04-15 ENCOUNTER — Ambulatory Visit: Payer: Medicare Other | Admitting: Obstetrics and Gynecology

## 2019-04-15 ENCOUNTER — Encounter: Payer: Self-pay | Admitting: Obstetrics and Gynecology

## 2019-04-15 VITALS — BP 150/74 | HR 67 | Ht 66.0 in | Wt 147.4 lb

## 2019-04-15 DIAGNOSIS — N952 Postmenopausal atrophic vaginitis: Secondary | ICD-10-CM

## 2019-04-15 DIAGNOSIS — N8111 Cystocele, midline: Secondary | ICD-10-CM | POA: Diagnosis not present

## 2019-04-15 DIAGNOSIS — R339 Retention of urine, unspecified: Secondary | ICD-10-CM | POA: Diagnosis not present

## 2019-04-15 DIAGNOSIS — Z4689 Encounter for fitting and adjustment of other specified devices: Secondary | ICD-10-CM

## 2019-04-15 MED ORDER — PREMARIN 0.625 MG/GM VA CREA: TOPICAL_CREAM | VAGINAL | 2 refills | Status: DC

## 2019-04-15 NOTE — Progress Notes (Signed)
GYN ENCOUNTER NOTE  Subjective:       Valerie Raymond is a 68 y.o. G0P0 female is here for gynecologic evaluation of the following issues: annual pessary maintenance. Pessary type: Ring with support. She has a PMH of cystocele, incomplete bladder emptying, vaginal atrophy, status post TAH/BSO.  She's used the pessary for several years now. With pessary in place, she notes complete bladder emptying and normal bowel function. She removes it nightly and reinserts in the a.m.  She also uses premarin cream intravaginally weekly. No issues with the pessary, no UTIs, she is satisfied with symptoms control.  She also follows with PCP for routine health.    Gynecologic History No LMP recorded. Patient has had a hysterectomy. Contraception: status post hysterectomy Last Pap: No history of abnormals Last mammogram: 7.20.2020 BI-RADS 1  Obstetric History OB History  Gravida Para Term Preterm AB Living  0            SAB TAB Ectopic Multiple Live Births               Past Medical History:  Diagnosis Date  . Arthralgia of hip 07/23/2015  . Arthritis    knee,hip,hands  . Degenerative disc disease, lumbar   . DJD (degenerative joint disease)   . Dysphagia    with solids and regurgitation  . Endometriosis    h/o  . GERD (gastroesophageal reflux disease)    occasional  . Heartburn   . Joint pain   . Osteoporosis    hip  . Pelvic pressure in female 01/02/2016  . Rash    skin rash and itching  . Vitamin D deficiency     Past Surgical History:  Procedure Laterality Date  . ABDOMINAL HYSTERECTOMY  1992  . COLONOSCOPY  2006  . COLONOSCOPY N/A 01/23/2015   Procedure: COLONOSCOPY;  Surgeon: Lucilla Lame, MD;  Location: [facility];  Service: Gastroenterology;  Laterality: N/A;  cecum- [id number]  . ESOPHAGEAL DILATION  04/21/2017   Procedure: ESOPHAGEAL DILATION;  Surgeon: Lucilla Lame, MD;  Location: [facility];  Service: Endoscopy;;  . ESOPHAGEAL DILATION  07/07/2017   Procedure:  ESOPHAGEAL DILATION;  Surgeon: Lucilla Lame, MD;  Location: [place];  Service: Endoscopy;;  . ESOPHAGEAL DILATION N/A 07/28/2017   Procedure: ESOPHAGEAL DILATION;  Surgeon: Lucilla Lame, MD;  Location: [place];  Service: Endoscopy;  Laterality: N/A;  . ESOPHAGOGASTRODUODENOSCOPY N/A 04/21/2017   Procedure: ESOPHAGOGASTRODUODENOSCOPY (EGD);  Surgeon: Lucilla Lame, MD;  Location: [place];  Service: Endoscopy;  Laterality: N/A;  . ESOPHAGOGASTRODUODENOSCOPY (EGD) WITH PROPOFOL N/A 07/07/2017   Procedure: ESOPHAGOGASTRODUODENOSCOPY (EGD) WITH PROPOFOL;  Surgeon: Lucilla Lame, MD;  Location: [place];  Service: Endoscopy;  Laterality: N/A;  . ESOPHAGOGASTRODUODENOSCOPY (EGD) WITH PROPOFOL N/A 07/28/2017   Procedure: ESOPHAGOGASTRODUODENOSCOPY (EGD) WITH PROPOFOL;  Surgeon: Lucilla Lame, MD;  Location: [place] [facility];  Service: Endoscopy;  Laterality: N/A;  . SALPINGOOPHORECTOMY Bilateral     Current Outpatient Medications on File Prior to Visit  Medication Sig Dispense Refill  . alendronate (FOSAMAX) 70 MG tablet TAKE 1 TABLET BY MOUTH ONCE A WEEK WITH A GLASS OF WATER ON A EMPTY STOMACH AND REMAIN UPRIGHT WITHOUT EATING FOR 30 MINUTES. 4 tablet 12  . Calcium Carbonate-Vit D-Min (CALCIUM 1200 PO) Take by mouth.    . Cholecalciferol (VITAMIN D3) 1000 UNITS CAPS Take by mouth. am    . meloxicam (MOBIC) 15 MG tablet Take 1 tablet (15 mg total) by mouth daily. 30 tablet 5  .  pantoprazole (PROTONIX) 40 MG tablet Take 1 tablet (40 mg total) by mouth daily. am 30 tablet 12  . PREMARIN vaginal cream INSERT 1/2 GRAM VAGINALLY 2 TIMES A WEEK 30 g 0   No current facility-administered medications on file prior to visit.     Allergies  Allergen Reactions  . Lodine [Etodolac] Diarrhea and Nausea And Vomiting    Social History   Socioeconomic History  . Marital status: Single    Spouse name: Not on file  . Number of children: 0  . Years of education:  Not on file  . Highest education level: Associate degree: academic program  Occupational History    Employer: [employer]  Social Needs  . Financial resource strain: Not hard at all  . Food insecurity    Worry: Never true    Inability: Never true  . Transportation needs    Medical: No    Non-medical: No  Tobacco Use  . Smoking status: Never Smoker  . Smokeless tobacco: Never Used  . Tobacco comment: smoking cessation materials not required  Substance and Sexual Activity  . Alcohol use: No    Alcohol/week: 0.0 standard drinks  . Drug use: No  . Sexual activity: Not Currently    Birth control/protection: Surgical  Lifestyle  . Physical activity    Days per week: 0 days    Minutes per session: 0 min  . Stress: Not at all  Relationships  . Social connections    Talks on phone: More than three times a week    Gets together: More than three times a week    Attends religious service: Never    Active member of club or organization: No    Attends meetings of clubs or organizations: Never    Relationship status: Never married  . Intimate partner violence    Fear of current or ex partner: No    Emotionally abused: No    Physically abused: No    Forced sexual activity: No  Other Topics Concern  . Not on file  Social History Narrative  . Not on file    Family History  Problem Relation Age of Onset  . Pancreatic cancer Mother   . Breast cancer Sister 33  . Prostate cancer Father   . Breast cancer Maternal Aunt   . Breast cancer Cousin        pat cousin  . Breast cancer Sister 17  . Colon cancer Paternal Uncle   . Prostate cancer Brother   . Ovarian cancer Neg Hx   . Diabetes Neg Hx     The following portions of the patient's history were reviewed and updated as appropriate: allergies, current medications, past family history, past medical history, past social history, past surgical history and problem list.  Review of Systems Review of Systems - see  HPI.  Objective:   BP (!) 150/74   Pulse 67   Ht 5\' 6"  (1.676 m)   Wt 66.9 kg   BMI 23.79 kg/m  CONSTITUTIONAL: Well-developed, well-nourished female in no acute distress.  HEENT: grossly normal SKIN: Skin is warm and dry. No rash noted. Not diaphoretic. No pallor. [place]: Alert and oriented PSYCHIATRIC: Normal mood and affect. Normal behavior. Normal judgment and thought content. CARDIOVASCULAR:Not Examined RESPIRATORY: Not Examined BREASTS: Not Examined ABDOMEN: Not Examined PELVIC:   External Genitalia: Normal  Vagina: Normal  Cervix: Surgically absent  Uterus: Surgically absent  Bladder: Nontender      Assessment:   1.  Midline cystocele 2.  Incomplete bladder emptying, resolved with pessary use   Problem List Items Addressed This Visit      Other   Pessary maintenance - Primary      Plan:   1.  Continue with pessary insertion every morning and pessary removal in early evening 2.  Continue with Premarin cream intravaginal 1-2x weekly. Refill sent.  3.  Return in 12 months for pessary maintenance.     Marin Roberts, PA-S 04/15/19

## 2019-04-15 NOTE — Progress Notes (Signed)
    GYNECOLOGY CLINIC PROGRESS NOTE  Subjective:    Patient ID: Valerie Raymond, female    DOB: [birth year], 67 y.o.   MRN: [id number]  HPI  Patient is a 68 y.o. G0P0 female who presents for pessary check.  She is transitioning care from Dr. Enzo Bi who is retiring. She currently has yearly pessary checks as she is able to perform self-maintenance. She has a h/o cysttocele, incomplete bladder emptying, remote h/o hysterectomy, and vaginal atrophy.  Currently using Premarin cream for pessary maintenance. She is overall doing well, has no complaints. She reports no vaginal bleeding or discharge. She denies pelvic discomfort and difficulty urinating or moving her bowels.   The following portions of the patient's history were reviewed and updated as appropriate: She  has a past medical history of Arthralgia of hip (07/23/2015), Arthritis, Degenerative disc disease, lumbar, DJD (degenerative joint disease), Dysphagia, Endometriosis, GERD (gastroesophageal reflux disease), Heartburn, Joint pain, Osteoporosis, Pelvic pressure in female (01/02/2016), Rash, and Vitamin D deficiency.   She  has a past surgical history that includes Colonoscopy (2006); Colonoscopy (N/A, 01/23/2015); Abdominal hysterectomy (1992); Salpingoophorectomy (Bilateral); Esophagogastroduodenoscopy (N/A, 04/21/2017); Esophageal dilation (04/21/2017); Esophagogastroduodenoscopy (egd) with propofol (N/A, 07/07/2017); Esophageal dilation (07/07/2017); Esophagogastroduodenoscopy (egd) with propofol (N/A, 07/28/2017); and Esophageal dilation (N/A, 07/28/2017).   Her family history includes Breast cancer in her cousin and maternal aunt; Breast cancer (age of onset: 7) in her sister; Breast cancer (age of onset: 5) in her sister; Colon cancer in her paternal uncle; Pancreatic cancer in her mother; Prostate cancer in her brother and father.   She  reports that she has never smoked. She has never used smokeless tobacco. She reports that she does not  drink alcohol or use drugs.   She has a current medication list which includes the following prescription(s): alendronate, calcium carbonate-vit d-min, vitamin d3, premarin, meloxicam, and pantoprazole.   She is allergic to lodine [etodolac]..  Review of Systems Pertinent items noted in HPI and remainder of comprehensive ROS otherwise negative.   Objective:   Blood pressure (!) 150/74, pulse 67, height 5\' 6"  (1.676 m), weight 147 lb 6.4 oz (66.9 kg). General appearance: alert and no distress Abdomen: soft, non-tender; bowel sounds normal; no masses,  no organomegaly Pelvic:  The patient's Size 3 ring with support pessary was removed, cleaned and replaced without complications. Speculum examination revealed normal vaginal mucosa with no lesions or lacerations.    Assessment:   Pessary maintenance  Midline cystocele Incomplete bladder emptying Vaginal atrophy   Plan:   The patient should return yearly for a pessary check and continue to use vaginal estrogen cream weekly as prescribed.Continue routine daily self-maintenance.  Has PCP who does physicals, including pelvic exam.    Rubie Maid, MD [facility]

## 2019-06-15 ENCOUNTER — Ambulatory Visit (INDEPENDENT_AMBULATORY_CARE_PROVIDER_SITE_OTHER): Payer: Medicare Other | Admitting: Internal Medicine

## 2019-06-15 ENCOUNTER — Encounter: Payer: Self-pay | Admitting: Internal Medicine

## 2019-06-15 ENCOUNTER — Other Ambulatory Visit: Payer: Self-pay

## 2019-06-15 VITALS — BP 136/82 | HR 77 | Ht 66.0 in | Wt 148.0 lb

## 2019-06-15 DIAGNOSIS — M5416 Radiculopathy, lumbar region: Secondary | ICD-10-CM

## 2019-06-15 MED ORDER — PREDNISONE 10 MG PO TABS: 10.0000 mg | ORAL_TABLET | ORAL | 0 refills | Status: AC

## 2019-06-15 NOTE — Progress Notes (Signed)
Date:  06/15/2019   Name:  Valerie Raymond   DOB:  Jan 31, 1951   MRN:  [id number]   Chief Complaint: Back Pain (Recurrent. Had problems 2 years ago. MRI showed bulging discs at that time. For last 2 months its been getting gradually worse. A lot of the pain was in both hips, but now her back is hurting and stiff when she moves. Standing for long period of times causes left leg to feel like its on "fire." Worse after laying for long periods and then standing- pain is at a 10. )  Back Pain This is a recurrent problem. The current episode started more than 1 year ago. The problem has been gradually worsening since onset. The pain is present in the lumbar spine. Associated symptoms include numbness and weakness (intermittent mild weakness of left leg but no falls or giving way). Pertinent negatives include no abdominal pain, chest pain or fever.  She was seen 2 yrs ago by ORtho.  MRI suggested lumbar HNP and exam c/w radiculopathy.  She was referred to Neurosurgery.  She had ESI without benefit.  NCS did not show any nerve damage so surgery was not recommended.  She was sent to PTx without benefit.  She was prescribed gabapentin which was too sedating. Over the past few months her pain has been increasing to the point that she has not been able to work.  She has pain in the lumbar spine that radiates to the left hip and down the lateral leg to the foot.  Review of Systems  Constitutional: Negative for chills, fatigue and fever.  Respiratory: Negative for chest tightness and shortness of breath.   Cardiovascular: Negative for chest pain and palpitations.  Gastrointestinal: Negative for abdominal pain.       No incontinence  Genitourinary: Negative for dyspareunia.       No incontinence  Musculoskeletal: Positive for back pain. Negative for gait problem and myalgias.  Neurological: Positive for weakness (intermittent mild weakness of left leg but no falls or giving way) and numbness. Negative for  dizziness.    Patient Active Problem List   Diagnosis Date Noted  . Hyperlipidemia, mild 05/22/2018  . Pessary maintenance 04/09/2018  . Lumbar radiculopathy 07/16/2017  . Stricture and stenosis of esophagus   . Lumbar pain with radiation down both legs 06/05/2017  . Dysphagia 03/21/2017  . Bursitis of both hips 12/27/2016  . Tonsillith 10/09/2016  . Family history of breast cancer in first degree relative 01/02/2016  . Status post total abdominal hysterectomy and bilateral salpingo-oophorectomy (TAH-BSO) 01/02/2016  . Vaginal atrophy 01/02/2016  . Incomplete bladder emptying 01/02/2016  . Midline cystocele 01/02/2016  . Colon, diverticulosis 07/23/2015  . Arthritis of knee, degenerative 07/23/2015  . Other osteoporosis without current pathological fracture 07/23/2015  . Avitaminosis D 07/23/2015    Allergies  Allergen Reactions  . Lodine [Etodolac] Diarrhea and Nausea And Vomiting    Past Surgical History:  Procedure Laterality Date  . ABDOMINAL HYSTERECTOMY  1992  . COLONOSCOPY  2006  . COLONOSCOPY N/A 01/23/2015   Procedure: COLONOSCOPY;  Surgeon: Lucilla Lame, MD;  Location: [place];  Service: Gastroenterology;  Laterality: N/A;  [id number]  . ESOPHAGEAL DILATION  04/21/2017   Procedure: ESOPHAGEAL DILATION;  Surgeon: Lucilla Lame, MD;  Location: [place];  Service: Endoscopy;;  . ESOPHAGEAL DILATION  07/07/2017   Procedure: ESOPHAGEAL DILATION;  Surgeon: Lucilla Lame, MD;  Location: [facility];  Service: Endoscopy;;  . ESOPHAGEAL DILATION  N/A 07/28/2017   Procedure: ESOPHAGEAL DILATION;  Surgeon: Lucilla Lame, MD;  Location: [place];  Service: Endoscopy;  Laterality: N/A;  . ESOPHAGOGASTRODUODENOSCOPY N/A 04/21/2017   Procedure: ESOPHAGOGASTRODUODENOSCOPY (EGD);  Surgeon: Lucilla Lame, MD;  Location: [place];  Service: Endoscopy;  Laterality: N/A;  . ESOPHAGOGASTRODUODENOSCOPY (EGD) WITH PROPOFOL N/A 07/07/2017    Procedure: ESOPHAGOGASTRODUODENOSCOPY (EGD) WITH PROPOFOL;  Surgeon: Lucilla Lame, MD;  Location: [facility];  Service: Endoscopy;  Laterality: N/A;  . ESOPHAGOGASTRODUODENOSCOPY (EGD) WITH PROPOFOL N/A 07/28/2017   Procedure: ESOPHAGOGASTRODUODENOSCOPY (EGD) WITH PROPOFOL;  Surgeon: Lucilla Lame, MD;  Location: [place];  Service: Endoscopy;  Laterality: N/A;  . SALPINGOOPHORECTOMY Bilateral     Social History   Tobacco Use  . Smoking status: Never Smoker  . Smokeless tobacco: Never Used  . Tobacco comment: smoking cessation materials not required  Substance Use Topics  . Alcohol use: No    Alcohol/week: 0.0 standard drinks  . Drug use: No     Medication list has been reviewed and updated.  Current Meds  Medication Sig  . alendronate (FOSAMAX) 70 MG tablet TAKE 1 TABLET BY MOUTH ONCE A WEEK WITH A GLASS OF WATER ON A EMPTY STOMACH AND REMAIN UPRIGHT WITHOUT EATING FOR 30 MINUTES.  . Calcium Carbonate-Vit D-Min (CALCIUM 1200 PO) Take by mouth.  . Cholecalciferol (VITAMIN D3) 1000 UNITS CAPS Take by mouth. am  . conjugated estrogens (PREMARIN) vaginal cream Place vaginally 2 (two) times a week. Use 1-2 times per week.  . meloxicam (MOBIC) 15 MG tablet Take 1 tablet (15 mg total) by mouth daily.  . pantoprazole (PROTONIX) 40 MG tablet Take 1 tablet (40 mg total) by mouth daily. am    PHQ 2/9 Scores 06/15/2019 04/06/2019 03/29/2019 03/04/2019  PHQ - 2 Score 0 0 0 0  PHQ- 9 Score - - - -    BP Readings from Last 3 Encounters:  06/15/19 136/82  04/15/19 (!) 150/74  04/06/19 120/68    Physical Exam Vitals signs and nursing note reviewed.  Constitutional:      General: She is not in acute distress.    Appearance: She is well-developed.  HENT:     Head: Normocephalic and atraumatic.  Neck:     Musculoskeletal: Normal range of motion.  Cardiovascular:     Rate and Rhythm: Normal rate and regular rhythm.     Heart sounds: No murmur.  Pulmonary:     Effort:  Pulmonary effort is normal. No respiratory distress.     Breath sounds: No stridor.  Musculoskeletal: Normal range of motion.     Right lower leg: No edema.     Left lower leg: No edema.  Skin:    General: Skin is warm and dry.     Findings: No rash.  Neurological:     Mental Status: She is alert and oriented to person, place, and time.     Sensory: Sensation is intact.     Deep Tendon Reflexes:     Reflex Scores:      Patellar reflexes are 2+ on the right side and 2+ on the left side.      Achilles reflexes are 2+ on the right side and 2+ on the left side.    Comments: SLR positive on left, negative on right  Psychiatric:        Behavior: Behavior normal.        Thought Content: Thought content normal.     Wt Readings from Last 3 Encounters:  06/15/19 148 lb (67.1 kg)  04/15/19 147 lb 6.4 oz (66.9 kg)  04/06/19 148 lb (67.1 kg)    BP 136/82   Pulse 77   Ht 5\' 6"  (1.676 m)   Wt 148 lb (67.1 kg)   SpO2 98%   BMI 23.89 kg/m   Assessment and Plan: 1. Lumbar radiculopathy Will treat with steroid taper; ice or heat Refer to Neurosurgery for further evaluation - predniSONE (DELTASONE) 10 MG tablet; Take 1 tablet (10 mg total) by mouth as directed for 6 days. Take 6,5,4,3,2,1 then stop  Dispense: 21 tablet; Refill: 0 - Ambulatory referral to Neurosurgery   Partially dictated using [place]. Any errors are unintentional.  Halina Maidens, MD [facility]  06/15/2019

## 2019-06-25 ENCOUNTER — Other Ambulatory Visit: Payer: Self-pay | Admitting: Student

## 2019-06-25 DIAGNOSIS — M5416 Radiculopathy, lumbar region: Secondary | ICD-10-CM | POA: Diagnosis not present

## 2019-06-25 DIAGNOSIS — G8929 Other chronic pain: Secondary | ICD-10-CM | POA: Diagnosis not present

## 2019-06-25 DIAGNOSIS — M5442 Lumbago with sciatica, left side: Secondary | ICD-10-CM | POA: Diagnosis not present

## 2019-06-29 ENCOUNTER — Ambulatory Visit
Admission: RE | Admit: 2019-06-29 | Discharge: 2019-06-29 | Disposition: A | Discharge status: Home / Self Care | Payer: Medicare Other | Source: Ambulatory Visit | Attending: Student | Admitting: Student

## 2019-06-29 ENCOUNTER — Other Ambulatory Visit: Payer: Self-pay

## 2019-06-29 DIAGNOSIS — G8929 Other chronic pain: Secondary | ICD-10-CM | POA: Insufficient documentation

## 2019-06-29 DIAGNOSIS — M5442 Lumbago with sciatica, left side: Secondary | ICD-10-CM | POA: Diagnosis not present

## 2019-06-29 DIAGNOSIS — M545 Low back pain: Secondary | ICD-10-CM | POA: Diagnosis not present

## 2019-07-05 ENCOUNTER — Other Ambulatory Visit: Payer: Self-pay | Admitting: Internal Medicine

## 2019-07-06 DIAGNOSIS — M545 Low back pain: Secondary | ICD-10-CM | POA: Diagnosis not present

## 2019-07-06 DIAGNOSIS — M5416 Radiculopathy, lumbar region: Secondary | ICD-10-CM | POA: Diagnosis not present

## 2019-07-12 DIAGNOSIS — M545 Low back pain: Secondary | ICD-10-CM | POA: Diagnosis not present

## 2019-07-12 DIAGNOSIS — M5416 Radiculopathy, lumbar region: Secondary | ICD-10-CM | POA: Diagnosis not present

## 2019-07-21 DIAGNOSIS — M5416 Radiculopathy, lumbar region: Secondary | ICD-10-CM | POA: Diagnosis not present

## 2019-07-21 DIAGNOSIS — M545 Low back pain: Secondary | ICD-10-CM | POA: Diagnosis not present

## 2019-08-02 DIAGNOSIS — M5416 Radiculopathy, lumbar region: Secondary | ICD-10-CM | POA: Diagnosis not present

## 2019-08-02 DIAGNOSIS — M545 Low back pain: Secondary | ICD-10-CM | POA: Diagnosis not present

## 2019-08-03 DIAGNOSIS — M7918 Myalgia, other site: Secondary | ICD-10-CM | POA: Insufficient documentation

## 2019-08-03 DIAGNOSIS — M899 Disorder of bone, unspecified: Secondary | ICD-10-CM | POA: Insufficient documentation

## 2019-08-03 DIAGNOSIS — M5416 Radiculopathy, lumbar region: Secondary | ICD-10-CM | POA: Insufficient documentation

## 2019-08-03 DIAGNOSIS — M47816 Spondylosis without myelopathy or radiculopathy, lumbar region: Secondary | ICD-10-CM | POA: Insufficient documentation

## 2019-08-03 DIAGNOSIS — M5127 Other intervertebral disc displacement, lumbosacral region: Secondary | ICD-10-CM | POA: Insufficient documentation

## 2019-08-03 DIAGNOSIS — M79605 Pain in left leg: Secondary | ICD-10-CM | POA: Insufficient documentation

## 2019-08-03 DIAGNOSIS — M4807 Spinal stenosis, lumbosacral region: Secondary | ICD-10-CM | POA: Insufficient documentation

## 2019-08-03 DIAGNOSIS — R937 Abnormal findings on diagnostic imaging of other parts of musculoskeletal system: Secondary | ICD-10-CM | POA: Insufficient documentation

## 2019-08-03 DIAGNOSIS — M4310 Spondylolisthesis, site unspecified: Secondary | ICD-10-CM | POA: Insufficient documentation

## 2019-08-03 DIAGNOSIS — Z79899 Other long term (current) drug therapy: Secondary | ICD-10-CM | POA: Insufficient documentation

## 2019-08-03 DIAGNOSIS — M48061 Spinal stenosis, lumbar region without neurogenic claudication: Secondary | ICD-10-CM | POA: Insufficient documentation

## 2019-08-03 DIAGNOSIS — G894 Chronic pain syndrome: Secondary | ICD-10-CM | POA: Insufficient documentation

## 2019-08-03 DIAGNOSIS — M545 Low back pain, unspecified: Secondary | ICD-10-CM | POA: Insufficient documentation

## 2019-08-03 DIAGNOSIS — Z789 Other specified health status: Secondary | ICD-10-CM | POA: Insufficient documentation

## 2019-08-03 DIAGNOSIS — M5137 Other intervertebral disc degeneration, lumbosacral region: Secondary | ICD-10-CM | POA: Insufficient documentation

## 2019-08-03 DIAGNOSIS — G8929 Other chronic pain: Secondary | ICD-10-CM | POA: Insufficient documentation

## 2019-08-03 HISTORY — DX: Radiculopathy, lumbar region: M54.16

## 2019-08-03 HISTORY — DX: Abnormal findings on diagnostic imaging of other parts of musculoskeletal system: R93.7

## 2019-08-03 NOTE — Progress Notes (Signed)
Patient's Name: Valerie Raymond  MRN: [id number]  Referring Provider: Marin Olp, PA-C  DOB: December 11, 1950  PCP: [name], MD  DOS: 08/04/2019  Note by: Gaspar Cola, MD  Service setting: Ambulatory outpatient  Specialty: Interventional Pain Management  Location: [facility] (AMB) Pain Management Facility  Visit type: Initial Patient Evaluation  Patient type: New Patient   Primary Reason(s) for Visit: Encounter for initial evaluation of one or more chronic problems (new to examiner) potentially causing chronic pain, and posing a threat to normal musculoskeletal function. (Level of risk: High) CC: Leg Pain (left) and Back Pain (low left)  HPI  Valerie Raymond is a 68 y.o. year old, female patient, who comes today to see [place] for the first time for an initial evaluation of her chronic pain. She has Colon, diverticulosis; Chronic hip pain (Bilateral) (L>R); Arthritis of knee, degenerative; Other osteoporosis without current pathological fracture; Avitaminosis D; Family history of breast cancer in first degree relative; Status post total abdominal hysterectomy and bilateral salpingo-oophorectomy (TAH-BSO); Vaginal atrophy; Incomplete bladder emptying; Midline cystocele; Tonsillith; Bursitis of hips (Bilateral); Dysphagia; Stricture and stenosis of esophagus; Lumbar pain with radiation down legs (Bilateral); Pessary maintenance; Hyperlipidemia, mild; Lumbar radiculopathy; Chronic pain syndrome; Pharmacologic therapy; Disorder of skeletal system; Problems influencing health status; Abnormal MRI, lumbar spine (06/29/2019); Grade 1 Lumbar Retrolisthesis L3/L4; DDD (degenerative disc disease), lumbosacral; Lumbosacral intervertebral disc displacement (IVDD); Lumbar facet arthropathy (Multilevel) (Bilateral); Lumbosacral foraminal stenosis (Bilateral) (L5-S1); Lumbar lateral recess stenosis (Left) (L2-3); Chronic lower extremity pain (Third area of Pain) (Bilateral) (L>R); Chronic low back pain (Secondary area of  Pain) (Bilateral) w/o sciatica; Chronic foot pain (Right); Chronic lumbar radiculitis; Chronic musculoskeletal pain; Chronic thigh pain (Primary Area of Pain) (Left); Lower extremity burning sensation (thigh) (Left); Greater trochanteric bursitis of hip (Left); and Lumbar facet syndrome (Bilateral) (L>R) on their problem list. Today she comes in for evaluation of her Leg Pain (left) and Back Pain (low left)  Pain Assessment: Location: Left Leg Radiating: radiates from left low back down left hip and in the side of left thigh Onset: More than a month ago Duration: Chronic pain Quality: Discomfort, Sharp, Shooting, Stabbing, Aching, Burning Severity: 2 /10 (subjective, self-reported pain score)  Note: Reported level is compatible with observation.                         When using our objective Pain Scale, levels between 6 and 10/10 are said to belong in an emergency room, as it progressively worsens from a 6/10, described as severely limiting, requiring emergency care not usually available at an outpatient pain management facility. At a 6/10 level, communication becomes difficult and requires great effort. Assistance to reach the emergency department may be required. Facial flushing and profuse sweating along with potentially dangerous increases in heart rate and blood pressure will be evident. Effect on ADL: Standing or sitting or sitting makes it worse Timing: Constant Modifying factors: laying on bed with pillow between BP: (!) 148/97  HR: 81  Onset and Duration: Gradual and Present longer than 3 months Cause of pain: Unknown Severity: Getting worse, NAS-11 at its worse: 7/10, NAS-11 at its best: 0/10, NAS-11 now: 4/10 and NAS-11 on the average: 4/10 Timing: Afternoon, Evening, During activity or exercise and After activity or exercise Aggravating Factors: Bending, Motion, Prolonged sitting, Prolonged standing, Squatting, Stooping , Twisting, Walking and Working Alleviating Factors: Cold  packs, Lying down and Resting Associated Problems: Fatigue, Weakness, Pain that wakes patient up  and Pain that does not allow patient to sleep Quality of Pain: Burning, Constant, Pulsating, Sharp, Uncomfortable and Work related Previous Examinations or Tests: MRI scan, Neurological evaluation, Orthopedic evaluation and Chiropractic evaluation Previous Treatments: Epidural steroid injections and Physical Therapy  The patient comes into the clinics today for the first time for a chronic pain management evaluation.  The patient indicates her primary pain to be in the area of the left thigh and hip.  She indicates that everything started around 2015 with pain on the right hip, but she was given some meloxicam and it did go away.  This pain later started coming back on the left side and she started to take some meloxicam again, but this time it has not helped.  She describes the pain as being on the left thigh on the lateral aspect of her leg down to the lateral aspect of her left knee.  This is usually a deep ache, but if she stays standing for prolonged period time, it becomes a burning sensation that then seems to go all the way down into her foot.  Physical exam today shows no evidence of lower extremity weakness.  The patient was able to heel walk and toe walk without any problems.  DTRs were within normal limits bilaterally for her Achilles tendon and patellar tendon.  Patrick maneuver was negative bilaterally for SI joint or hip pathology.  Range of motion was within normal limits for her hip joints.  She did have a positive test for bilateral facet joint pain with pain on hyperextension and rotation which reproduces her left low back pain and left lower extremity pain over the lateral aspect of the thigh upon testing the left side.  Physical exam was also positive for exact reproduction of the patient's pain upon putting pressure over her left trochanteric bursa.  The patient has a history of having had  II nerve blocks done by Dr. Sharlet Salina, both of which were not effective in controlling her pain and furthermore the first 1 felt like it made things worse.  According to the medical record the patient had a left L5 transforaminal epidural steroid injection done with Decadron 10 mg on 03/27/2017 at 8:15 AM.  This was followed on 05/08/2017 at 9:45 AM with a left-sided L4 transforaminal epidural steroid injection, also using some Decadron.  Neither one of them helped her pain.  Today I took the time to provide the patient with information regarding my pain practice. The patient was informed that my practice is divided into two sections: an interventional pain management section, as well as a completely separate and distinct medication management section. I explained that I have procedure days for my interventional therapies, and evaluation days for follow-ups and medication management. Because of the amount of documentation required during both, they are kept separated. This means that there is the possibility that she may be scheduled for a procedure on one day, and medication management the next. I have also informed her that because of staffing and facility limitations, I no longer take patients for medication management only. To illustrate the reasons for this, I gave the patient the example of surgeons, and how inappropriate it would be to refer a patient to his/her care, just to write for the post-surgical antibiotics on a surgery done by a different surgeon.   Because interventional pain management is my board-certified specialty, the patient was informed that joining my practice means that they are open to any and all interventional therapies. I made  it clear that this does not mean that they will be forced to have any procedures done. What this means is that I believe interventional therapies to be essential part of the diagnosis and proper management of chronic pain conditions. Therefore, patients not  interested in these interventional alternatives will be better served under the care of a different practitioner.  The patient was also made aware of my Comprehensive Pain Management Safety Guidelines where by joining my practice, they limit all of their nerve blocks and joint injections to those done by our practice, for as long as we are retained to manage their care.   Historic Controlled Substance Pharmacotherapy Review  PMP and historical list of controlled substances: None MME/day: 0 mg/day  Medications: The patient did not bring the medication(s) to the appointment, as requested in our "New Patient Package" Pharmacodynamics: Desired effects: Analgesia: The patient reports >50% benefit. Reported improvement in function: The patient reports medication allows her to accomplish basic ADLs. Clinically meaningful improvement in function (CMIF): Sustained CMIF goals met Perceived effectiveness: Described as relatively effective, allowing for increase in activities of daily living (ADL) Undesirable effects: Side-effects or Adverse reactions: None reported Historical Monitoring: The patient  reports no history of drug use. List of all UDS Test(s): No results found. List of other Serum/Urine Drug Screening Test(s):  No results found. Historical Background Evaluation: [place] PMP: PDMP reviewed during this encounter. Six (6) year initial data search conducted.             PMP NARX Score Report:  Narcotic: 000 Sedative: 000 Stimulant: 000 [place] Department of public safety, offender search: Editor, commissioning Information) Non-contributory Risk Assessment Profile: Aberrant behavior: None observed or detected today Risk factors for fatal opioid overdose: None identified today PMP NARX Overdose Risk Score: 000 Fatal overdose hazard ratio (HR): Calculation deferred Non-fatal overdose hazard ratio (HR): Calculation deferred Risk of opioid abuse or dependence: 0.7-3.0% with doses ? 36 MME/day and 6.1-26% with  doses ? 120 MME/day. Substance use disorder (SUD) risk level: See below Personal History of Substance Abuse (SUD-Substance use disorder):  Alcohol:    Illegal Drugs:    Rx Drugs:    ORT Risk Level calculation:    ORT Scoring interpretation table:  Score <3 = Low Risk for SUD  Score between 4-7 = Moderate Risk for SUD  Score >8 = High Risk for Opioid Abuse   PHQ-2 Depression Scale:  Total score: 0  PHQ-2 Scoring interpretation table: (Score and probability of major depressive disorder)  Score 0 = No depression  Score 1 = 15.4% Probability  Score 2 = 21.1% Probability  Score 3 = 38.4% Probability  Score 4 = 45.5% Probability  Score 5 = 56.4% Probability  Score 6 = 78.6% Probability   PHQ-9 Depression Scale:  Total score: 0  PHQ-9 Scoring interpretation table:  Score 0-4 = No depression  Score 5-9 = Mild depression  Score 10-14 = Moderate depression  Score 15-19 = Moderately severe depression  Score 20-27 = Severe depression (2.4 times higher risk of SUD and 2.89 times higher risk of overuse)   Pharmacologic Plan: As per protocol, I have not taken over any controlled substance management, pending the results of ordered tests and/or consults.            Initial impression: Pending review of available data and ordered tests.  Meds   Current Outpatient Medications:  .  alendronate (FOSAMAX) 70 MG tablet, TAKE 1 TABLET BY MOUTH ONCE A WEEK WITH A GLASS OF  WATER ON A EMPTY STOMACH AND REMAIN UPRIGHT WITHOUT EATING FOR 30 MINUTES., Disp: 4 tablet, Rfl: 12 .  Calcium Carbonate-Vit D-Min (CALCIUM 1200 PO), Take by mouth., Disp: , Rfl:  .  Cholecalciferol (VITAMIN D3) 1000 UNITS CAPS, Take by mouth. am, Disp: , Rfl:  .  conjugated estrogens (PREMARIN) vaginal cream, Place vaginally 2 (two) times a week. Use 1-2 times per week., Disp: 30 g, Rfl: 2 .  meloxicam (MOBIC) 15 MG tablet, Take 1 tablet (15 mg total) by mouth daily., Disp: 30 tablet, Rfl: 5 .  pantoprazole (PROTONIX) 40 MG  tablet, TAKE 1 TABLET(40 MG) BY MOUTH DAILY IN THE MORNING, Disp: 30 tablet, Rfl: 12  Imaging Review  Lumbosacral Imaging: Lumbar MR wo contrast:  Results for orders placed during the hospital encounter of 06/29/19  MR LUMBAR SPINE WO CONTRAST   Narrative CLINICAL DATA:  Chronic low back pain radiating to left hip  EXAM: MRI LUMBAR SPINE WITHOUT CONTRAST  TECHNIQUE: Multiplanar, multisequence MR imaging of the lumbar spine was performed. No intravenous contrast was administered.  COMPARISON:  01/29/2017  FINDINGS: Segmentation:  Standard.  Alignment:  2 mm retrolisthesis of L3 on L4.  Vertebrae:  No fracture, evidence of discitis, or bone lesion.  Conus medullaris and cauda equina: Conus extends to the L1 level. Conus and cauda equina appear normal.  Paraspinal and other soft tissues: No acute paraspinal abnormality. Stable right hydronephrosis which may be secondary to UPJ obstruction given relatively normal caliber proximal ureter.  Disc levels:  Disc spaces: Degenerative disease with disc height loss and reactive endplate changes at B0-9. Mild degenerative disc disease with disc height loss at L3-4, L4-5 and L5-S1.  T12-L1: No significant disc bulge. No evidence of neural foraminal stenosis. No central canal stenosis.  L1-L2: Broad-based disc bulge. No evidence of neural foraminal stenosis. No central canal stenosis.  L2-L3: Broad-based disc bulge. Left subarticular recess stenosis. No evidence of neural foraminal stenosis. No central canal stenosis.  L3-L4: Mild broad-based disc bulge. Mild bilateral facet arthropathy. No evidence of neural foraminal stenosis. No central canal stenosis.  L4-L5: Broad-based disc bulge eccentric towards the right. No evidence of neural foraminal stenosis. No central canal stenosis.  L5-S1: Mild broad-based disc bulge. Mild bilateral facet arthropathy. Mild bilateral foraminal stenosis. No central  canal stenosis.  IMPRESSION: 1. Mild lumbar spine spondylosis as described above. No significant interval change compared with 01/29/2017. 2.  No acute osseous injury of the lumbar spine.   Electronically Signed   By: Kathreen Devoid   On: 06/29/2019 14:56    Foot Imaging: Foot-R DG Complete:  Results for orders placed during the hospital encounter of 03/04/19  DG Foot Complete Right   Narrative CLINICAL DATA:  Pain involving the lateral aspect of the foot about the distal head of the fifth metatarsal the past several months, worse during the past 2 weeks. History of stress fracture involving the fifth metatarsal 3 years ago.  EXAM: RIGHT FOOT COMPLETE - 3+ VIEW  COMPARISON:  Left foot radiographs-05/01/2016  FINDINGS: Complete healing of previously noted nondisplaced fracture involving the proximal diaphysis of the fifth metatarsal.  No new fracture or dislocation. Severe hallux valgus deformity with mild degenerative change of the first MTP joint as well as the first TMT joint with joint space loss, subchondral sclerosis and osteophytosis. Remaining joint spaces appear preserved. No definite erosions.  Note is again made of an os tibialis externum as well as a bipartite os peroneus.  Tiny plantar calcaneal spur. Minimal  enthesopathic change involving the Achilles tendon insertion site.  IMPRESSION: 1. No definite explanation for patient's acute on chronic lateral sided foot pain. 2. Interval complete healing previously noted nondisplaced fracture involving the proximal diaphysis of the fifth metatarsal. 3. Severe hallux valgus deformity with associated mild degenerative change of the first MTP and TMT joints, grossly unchanged compared to the 04/2016 exam. 4. Tiny plantar calcaneal spur.   Electronically Signed   By: Sandi Mariscal M.D.   On: 03/04/2019 16:27    Complexity Note: Imaging results reviewed. Results shared with Valerie Raymond, using Layman's terms.                          ROS  Cardiovascular: No reported cardiovascular signs or symptoms such as High blood pressure, coronary artery disease, abnormal heart rate or rhythm, heart attack, blood thinner therapy or heart weakness and/or failure Pulmonary or Respiratory: No reported pulmonary signs or symptoms such as wheezing and difficulty taking a deep full breath (Asthma), difficulty blowing air out (Emphysema), coughing up mucus (Bronchitis), persistent dry cough, or temporary stoppage of breathing during sleep Neurological: No reported neurological signs or symptoms such as seizures, abnormal skin sensations, urinary and/or fecal incontinence, being born with an abnormal open spine and/or a tethered spinal cord Review of Past Neurological Studies: No results found for this or any previous visit. Psychological-Psychiatric: No reported psychological or psychiatric signs or symptoms such as difficulty sleeping, anxiety, depression, delusions or hallucinations (schizophrenial), mood swings (bipolar disorders) or suicidal ideations or attempts Gastrointestinal: No reported gastrointestinal signs or symptoms such as vomiting or evacuating blood, reflux, heartburn, alternating episodes of diarrhea and constipation, inflamed or scarred liver, or pancreas or irrregular and/or infrequent bowel movements Genitourinary: No reported renal or genitourinary signs or symptoms such as difficulty voiding or producing urine, peeing blood, non-functioning kidney, kidney stones, difficulty emptying the bladder, difficulty controlling the flow of urine, or chronic kidney disease Hematological: No reported hematological signs or symptoms such as prolonged bleeding, low or poor functioning platelets, bruising or bleeding easily, hereditary bleeding problems, low energy levels due to low hemoglobin or being anemic Endocrine: No reported endocrine signs or symptoms such as high or low blood sugar, rapid heart rate due to high  thyroid levels, obesity or weight gain due to slow thyroid or thyroid disease Rheumatologic: Joint aches and or swelling due to excess weight (Osteoarthritis) Musculoskeletal: Negative for myasthenia gravis, muscular dystrophy, multiple sclerosis or malignant hyperthermia Work History: Working full time  Allergies  Valerie Raymond is allergic to lodine [etodolac].  Laboratory Chemistry Profile   Screening No results found.  Inflammation (CRP: Acute Phase) (ESR: Chronic Phase) No results found.  Rheumatology No results found.  Renal Lab Results  Component Value Date   BUN 12 04/06/2019   CREATININE 0.83 04/06/2019   BCR 14 04/06/2019   GFRAA 84 04/06/2019   GFRNONAA 73 04/06/2019                             Hepatic Lab Results  Component Value Date   AST 27 04/06/2019   ALT 20 04/06/2019   ALBUMIN 4.6 04/06/2019   ALKPHOS 93 04/06/2019                        Electrolytes Lab Results  Component Value Date   NA 139 04/06/2019   K 4.1 04/06/2019   CL 99 04/06/2019  CALCIUM 10.2 04/06/2019                        Neuropathy No results found.  CNS No results found.  Bone Lab Results  Component Value Date   VD25OH 28.6 (L) 03/13/2016                         Coagulation Lab Results  Component Value Date   PLT 294 04/06/2019                        Cardiovascular Lab Results  Component Value Date   HGB 13.6 04/06/2019   HCT 41.1 04/06/2019                         ID No results found.  Cancer No results found.  Endocrine Lab Results  Component Value Date   TSH 2.260 04/06/2019                        Note: Lab results reviewed.  [place]  Drug: Valerie Raymond  reports no history of drug use. Alcohol:  reports no history of alcohol use. Tobacco:  reports that she has never smoked. She has never used smokeless tobacco. Medical:  has a past medical history of Arthralgia of hip (07/23/2015), Arthritis, Degenerative disc disease, lumbar, DJD (degenerative joint  disease), Dysphagia, Endometriosis, GERD (gastroesophageal reflux disease), Heartburn, Joint pain, Osteoporosis, Pelvic pressure in female (01/02/2016), Rash, and Vitamin D deficiency. Family: family history includes Breast cancer in her cousin and maternal aunt; Breast cancer (age of onset: 37) in her sister; Breast cancer (age of onset: 44) in her sister; Colon cancer in her paternal uncle; Pancreatic cancer in her mother; Prostate cancer in her brother and father.  Past Surgical History:  Procedure Laterality Date  . ABDOMINAL HYSTERECTOMY  1992  . COLONOSCOPY  2006  . COLONOSCOPY N/A 01/23/2015   Procedure: COLONOSCOPY;  Surgeon: Lucilla Lame, MD;  Location: [place];  Service: Gastroenterology;  Laterality: N/A;  cecum- [id number]  . ESOPHAGEAL DILATION  04/21/2017   Procedure: ESOPHAGEAL DILATION;  Surgeon: Lucilla Lame, MD;  Location: [place];  Service: Endoscopy;;  . ESOPHAGEAL DILATION  07/07/2017   Procedure: ESOPHAGEAL DILATION;  Surgeon: Lucilla Lame, MD;  Location: [place];  Service: Endoscopy;;  . ESOPHAGEAL DILATION N/A 07/28/2017   Procedure: ESOPHAGEAL DILATION;  Surgeon: Lucilla Lame, MD;  Location: [place];  Service: Endoscopy;  Laterality: N/A;  . ESOPHAGOGASTRODUODENOSCOPY N/A 04/21/2017   Procedure: ESOPHAGOGASTRODUODENOSCOPY (EGD);  Surgeon: Lucilla Lame, MD;  Location: [place];  Service: Endoscopy;  Laterality: N/A;  . ESOPHAGOGASTRODUODENOSCOPY (EGD) WITH PROPOFOL N/A 07/07/2017   Procedure: ESOPHAGOGASTRODUODENOSCOPY (EGD) WITH PROPOFOL;  Surgeon: Lucilla Lame, MD;  Location: [place];  Service: Endoscopy;  Laterality: N/A;  . ESOPHAGOGASTRODUODENOSCOPY (EGD) WITH PROPOFOL N/A 07/28/2017   Procedure: ESOPHAGOGASTRODUODENOSCOPY (EGD) WITH PROPOFOL;  Surgeon: Lucilla Lame, MD;  Location: [place];  Service: Endoscopy;  Laterality: N/A;  . SALPINGOOPHORECTOMY Bilateral    Active Ambulatory Problems     Diagnosis Date Noted  . Colon, diverticulosis 07/23/2015  . Chronic hip pain (Bilateral) (L>R) 07/23/2015  . Arthritis of knee, degenerative 07/23/2015  . Other osteoporosis without current pathological fracture 07/23/2015  . Avitaminosis D 07/23/2015  . Family history of breast cancer in first degree relative 01/02/2016  . Status post  total abdominal hysterectomy and bilateral salpingo-oophorectomy (TAH-BSO) 01/02/2016  . Vaginal atrophy 01/02/2016  . Incomplete bladder emptying 01/02/2016  . Midline cystocele 01/02/2016  . Tonsillith 10/09/2016  . Bursitis of hips (Bilateral) 12/27/2016  . Dysphagia 03/21/2017  . Stricture and stenosis of esophagus   . Lumbar pain with radiation down legs (Bilateral) 06/05/2017  . Pessary maintenance 04/09/2018  . Hyperlipidemia, mild 05/22/2018  . Lumbar radiculopathy 07/16/2017  . Chronic pain syndrome 08/03/2019  . Pharmacologic therapy 08/03/2019  . Disorder of skeletal system 08/03/2019  . Problems influencing health status 08/03/2019  . Abnormal MRI, lumbar spine (06/29/2019) 08/03/2019  . Grade 1 Lumbar Retrolisthesis L3/L4 08/03/2019  . DDD (degenerative disc disease), lumbosacral 08/03/2019  . Lumbosacral intervertebral disc displacement (IVDD) 08/03/2019  . Lumbar facet arthropathy (Multilevel) (Bilateral) 08/03/2019  . Lumbosacral foraminal stenosis (Bilateral) (L5-S1) 08/03/2019  . Lumbar lateral recess stenosis (Left) (L2-3) 08/03/2019  . Chronic lower extremity pain (Third area of Pain) (Bilateral) (L>R) 08/03/2019  . Chronic low back pain (Secondary area of Pain) (Bilateral) w/o sciatica 08/03/2019  . Chronic foot pain (Right) 08/03/2019  . Chronic lumbar radiculitis 08/03/2019  . Chronic musculoskeletal pain 08/03/2019  . Chronic thigh pain (Primary Area of Pain) (Left) 08/04/2019  . Lower extremity burning sensation (thigh) (Left) 08/04/2019  . Greater trochanteric bursitis of hip (Left) 08/04/2019  . Lumbar facet syndrome  (Bilateral) (L>R) 08/04/2019   Resolved Ambulatory Problems    Diagnosis Date Noted  . Osteoporosis 01/02/2016  . Pelvic pressure in female 01/02/2016   Past Medical History:  Diagnosis Date  . Arthralgia of hip 07/23/2015  . Arthritis   . Degenerative disc disease, lumbar   . DJD (degenerative joint disease)   . Endometriosis   . GERD (gastroesophageal reflux disease)   . Heartburn   . Joint pain   . Rash   . Vitamin D deficiency    Constitutional Exam  General appearance: Well nourished, well developed, and well hydrated. In no apparent acute distress Vitals:   08/04/19 0841  BP: (!) 148/97  Pulse: 81  Resp: 18  Temp: 98.5 F (36.9 C)  SpO2: 100%  Weight: 145 lb (65.8 kg)  Height: '5\' 5"'$  (1.651 m)   BMI Assessment: Estimated body mass index is 24.13 kg/m as calculated from the following:   Height as of this encounter: '5\' 5"'$  (1.651 m).   Weight as of this encounter: 145 lb (65.8 kg).  BMI interpretation table: BMI level Category Range association with higher incidence of chronic pain  <18 kg/m2 Underweight   18.5-24.9 kg/m2 Ideal body weight   25-29.9 kg/m2 Overweight Increased incidence by 20%  30-34.9 kg/m2 Obese (Class I) Increased incidence by 68%  35-39.9 kg/m2 Severe obesity (Class II) Increased incidence by 136%  >40 kg/m2 Extreme obesity (Class III) Increased incidence by 254%   Patient's current BMI Ideal Body weight  Body mass index is 24.13 kg/m. Ideal body weight: 57 kg (125 lb 10.6 oz) Adjusted ideal body weight: 60.5 kg (133 lb 6.4 oz)   BMI Readings from Last 4 Encounters:  08/04/19 24.13 kg/m  06/15/19 23.89 kg/m  04/15/19 23.79 kg/m  04/06/19 23.89 kg/m   Wt Readings from Last 4 Encounters:  08/04/19 145 lb (65.8 kg)  06/15/19 148 lb (67.1 kg)  04/15/19 147 lb 6.4 oz (66.9 kg)  04/06/19 148 lb (67.1 kg)  Psych/Mental status: Alert, oriented x 3 (person, place, & time)       Eyes: PERLA Respiratory: No evidence of acute respiratory  distress  Cervical Spine Area Exam  Skin & Axial Inspection: No masses, redness, edema, swelling, or associated skin lesions Alignment: Symmetrical Functional ROM: Unrestricted ROM      Stability: No instability detected Muscle Tone/Strength: Functionally intact. No obvious neuro-muscular anomalies detected. Sensory (Neurological): Unimpaired Palpation: No palpable anomalies              Upper Extremity (UE) Exam    Side: Right upper extremity  Side: Left upper extremity  Skin & Extremity Inspection: Skin color, temperature, and hair growth are WNL. No peripheral edema or cyanosis. No masses, redness, swelling, asymmetry, or associated skin lesions. No contractures.  Skin & Extremity Inspection: Skin color, temperature, and hair growth are WNL. No peripheral edema or cyanosis. No masses, redness, swelling, asymmetry, or associated skin lesions. No contractures.  Functional ROM: Unrestricted ROM          Functional ROM: Unrestricted ROM          Muscle Tone/Strength: Functionally intact. No obvious neuro-muscular anomalies detected.  Muscle Tone/Strength: Functionally intact. No obvious neuro-muscular anomalies detected.  Sensory (Neurological): Unimpaired          Sensory (Neurological): Unimpaired          Palpation: No palpable anomalies              Palpation: No palpable anomalies              Provocative Test(s):  Phalen's test: deferred Tinel's test: deferred Apley's scratch test (touch opposite shoulder):  Action 1 (Across chest): deferred Action 2 (Overhead): deferred Action 3 (LB reach): deferred   Provocative Test(s):  Phalen's test: deferred Tinel's test: deferred Apley's scratch test (touch opposite shoulder):  Action 1 (Across chest): deferred Action 2 (Overhead): deferred Action 3 (LB reach): deferred    Thoracic Spine Area Exam  Skin & Axial Inspection: No masses, redness, or swelling Alignment: Symmetrical Functional ROM: Unrestricted ROM Stability: No  instability detected Muscle Tone/Strength: Functionally intact. No obvious neuro-muscular anomalies detected. Sensory (Neurological): Unimpaired Muscle strength & Tone: No palpable anomalies  Lumbar Spine Area Exam  Skin & Axial Inspection: No masses, redness, or swelling Alignment: Symmetrical Functional ROM: Decreased ROM       Stability: No instability detected Muscle Tone/Strength: Functionally intact. No obvious neuro-muscular anomalies detected. Sensory (Neurological): Unimpaired Palpation: Complains of area being tender to palpation       Provocative Tests: Hyperextension/rotation test: (+) bilaterally for facet joint pain. Lumbar quadrant test (Kemp's test): deferred today       Lateral bending test: deferred today       Patrick's Maneuver: (-) for bilateral S-I arthralgia and for bilateral hip arthralgia FABER* test: (-) for bilateral S-I arthralgia and for bilateral hip arthralgia S-I anterior distraction/compression test: deferred today         S-I lateral compression test: deferred today         S-I Thigh-thrust test: deferred today         S-I Gaenslen's test: deferred today         *(Flexion, ABduction and External Rotation)  Gait & Posture Assessment  Ambulation: Unassisted Gait: Relatively normal for age and body habitus Posture: WNL   Lower Extremity Exam    Side: Right lower extremity  Side: Left lower extremity  Stability: No instability observed          Stability: No instability observed          Skin & Extremity Inspection: Skin color, temperature, and hair growth are WNL.  No peripheral edema or cyanosis. No masses, redness, swelling, asymmetry, or associated skin lesions. No contractures.  Skin & Extremity Inspection: Skin color, temperature, and hair growth are WNL. No peripheral edema or cyanosis. No masses, redness, swelling, asymmetry, or associated skin lesions. No contractures.  Functional ROM: Unrestricted ROM                  Functional ROM:  Unrestricted ROM                  Muscle Tone/Strength: Able to Toe-walk & Heel-walk without problems  Muscle Tone/Strength: Able to Toe-walk & Heel-walk without problems  Sensory (Neurological): Unimpaired        Sensory (Neurological): Unimpaired      Positive tenderness to palpation over the left trochanteric bursa with exact reproduction of the patient's primary pain.  DTR: Patellar: 3+: brisk Achilles: 2+: normal Plantar: deferred today  DTR: Patellar: 3+: brisk Achilles: 2+: normal Plantar: deferred today  Palpation: No palpable anomalies  Palpation: No palpable anomalies   Assessment  Primary Diagnosis & Pertinent Problem List: The primary encounter diagnosis was Chronic thigh pain (Primary Area of Pain) (Left). Diagnoses of Greater trochanteric bursitis of hip (Left), Chronic low back pain (Secondary area of Pain) (Bilateral) w/o sciatica, Lumbar facet syndrome (Bilateral) (L>R), Lower extremity burning sensation (thigh) (Left), Chronic hip pain (Bilateral) (L>R), Chronic lower extremity pain (Third area of Pain) (Bilateral) (L>R), Chronic pain syndrome, Pharmacologic therapy, Disorder of skeletal system, Problems influencing health status, and Avitaminosis D were also pertinent to this visit.  Visit Diagnosis (New problems to examiner): 1. Chronic thigh pain (Primary Area of Pain) (Left)   2. Greater trochanteric bursitis of hip (Left)   3. Chronic low back pain (Secondary area of Pain) (Bilateral) w/o sciatica   4. Lumbar facet syndrome (Bilateral) (L>R)   5. Lower extremity burning sensation (thigh) (Left)   6. Chronic hip pain (Bilateral) (L>R)   7. Chronic lower extremity pain (Third area of Pain) (Bilateral) (L>R)   8. Chronic pain syndrome   9. Pharmacologic therapy   10. Disorder of skeletal system   11. Problems influencing health status   12. Avitaminosis D    Plan of Care (Initial workup plan)  Note: Valerie Raymond was reminded that as per protocol, today's visit has been  an evaluation only. We have not taken over the patient's controlled substance management.  Problem-specific plan: No problem-specific Assessment & Plan notes found for this encounter.   Lab Orders     Comprehensive Metabolic Panel w/GFR     Magnesium     Vitamin B12     Sedimentation rate     Vitamin D (D2+D3)     CRP  Imaging Orders     DG Lumbar Spine Complete w/ Flex/Ext (6 Views)     DG Hip (Right)     DG Hip (Left) Referral Orders  No referral(s) requested today    Procedure Orders     Hip injection (Schedule) Pharmacotherapy (current): Medications ordered:  No orders of the defined types were placed in this encounter.  Medications administered during this visit: Valerie Raymond. Raymond "Valerie Raymond" had no medications administered during this visit.   Pharmacological management options:  Opioid Analgesics: The patient was informed that there is no guarantee that she would be a candidate for opioid analgesics. The decision will be made following CDC guidelines. This decision will be based on the results of diagnostic studies, as well as Valerie Raymond's risk profile.   Membrane  stabilizer: To be determined at a later time  Muscle relaxant: To be determined at a later time  NSAID: To be determined at a later time  Other analgesic(s): To be determined at a later time   Interventional management options: Valerie Raymond was informed that there is no guarantee that she would be a candidate for interventional therapies. The decision will be based on the results of diagnostic studies, as well as Valerie Raymond risk profile.  Procedure(s) under consideration:  Diagnostic left trochanteric bursa injection  Diagnostic bilateral vs left-sided lumbar facet block  Possible bilateral vs left lumbar facet RFA    Provider-requested follow-up: Return for Procedure (no sedation): (L) TBI #1.  Future Appointments  Date Time Provider [place]  08/17/2019 10:15 AM Milinda Pointer, MD [facility] None   04/03/2020  8:40 AM [place] ADVISOR [facility] None  04/06/2020  8:20 AM [name], MD [facility] None  04/13/2020 10:30 AM Rubie Maid, MD [facility] None    Primary Care Physician: [name], MD Location: [facility] Note by: Gaspar Cola, MD Date: 08/04/2019; Time: 10:53 AM  Note: This dictation was prepared with Dragon dictation. Any transcriptional errors that may result from this process are unintentional.

## 2019-08-04 ENCOUNTER — Ambulatory Visit
Admission: RE | Admit: 2019-08-04 | Discharge: 2019-08-04 | Disposition: A | Discharge status: Home / Self Care | Payer: Medicare Other | Source: Ambulatory Visit | Attending: Pain Medicine | Admitting: Pain Medicine

## 2019-08-04 ENCOUNTER — Other Ambulatory Visit: Payer: Self-pay

## 2019-08-04 ENCOUNTER — Ambulatory Visit (HOSPITAL_BASED_OUTPATIENT_CLINIC_OR_DEPARTMENT_OTHER): Payer: Medicare Other | Admitting: Pain Medicine

## 2019-08-04 ENCOUNTER — Encounter: Payer: Self-pay | Admitting: Pain Medicine

## 2019-08-04 VITALS — BP 148/97 | HR 81 | Temp 98.5°F | Resp 18 | Ht 65.0 in | Wt 145.0 lb

## 2019-08-04 DIAGNOSIS — M79604 Pain in right leg: Secondary | ICD-10-CM | POA: Insufficient documentation

## 2019-08-04 DIAGNOSIS — M25552 Pain in left hip: Secondary | ICD-10-CM | POA: Insufficient documentation

## 2019-08-04 DIAGNOSIS — M47816 Spondylosis without myelopathy or radiculopathy, lumbar region: Secondary | ICD-10-CM | POA: Insufficient documentation

## 2019-08-04 DIAGNOSIS — M7062 Trochanteric bursitis, left hip: Secondary | ICD-10-CM | POA: Insufficient documentation

## 2019-08-04 DIAGNOSIS — M25551 Pain in right hip: Secondary | ICD-10-CM

## 2019-08-04 DIAGNOSIS — M79605 Pain in left leg: Secondary | ICD-10-CM | POA: Insufficient documentation

## 2019-08-04 DIAGNOSIS — E559 Vitamin D deficiency, unspecified: Secondary | ICD-10-CM | POA: Diagnosis not present

## 2019-08-04 DIAGNOSIS — M545 Low back pain, unspecified: Secondary | ICD-10-CM

## 2019-08-04 DIAGNOSIS — Z79899 Other long term (current) drug therapy: Secondary | ICD-10-CM | POA: Diagnosis not present

## 2019-08-04 DIAGNOSIS — G8929 Other chronic pain: Secondary | ICD-10-CM | POA: Insufficient documentation

## 2019-08-04 DIAGNOSIS — R208 Other disturbances of skin sensation: Secondary | ICD-10-CM | POA: Insufficient documentation

## 2019-08-04 DIAGNOSIS — M899 Disorder of bone, unspecified: Secondary | ICD-10-CM | POA: Diagnosis not present

## 2019-08-04 DIAGNOSIS — Z789 Other specified health status: Secondary | ICD-10-CM | POA: Insufficient documentation

## 2019-08-04 DIAGNOSIS — M48061 Spinal stenosis, lumbar region without neurogenic claudication: Secondary | ICD-10-CM | POA: Diagnosis not present

## 2019-08-04 DIAGNOSIS — M79652 Pain in left thigh: Secondary | ICD-10-CM | POA: Diagnosis not present

## 2019-08-04 DIAGNOSIS — G894 Chronic pain syndrome: Secondary | ICD-10-CM

## 2019-08-04 NOTE — Progress Notes (Signed)
Safety precautions to be maintained throughout the outpatient stay will include: orient to surroundings, keep bed in low position, maintain call bell within reach at all times, provide assistance with transfer out of bed and ambulation.  

## 2019-08-04 NOTE — Patient Instructions (Signed)
____________________________________________________________________________________________  Preparing for your procedure (without sedation)  Procedure appointments are limited to planned procedures: . No Prescription Refills. . No disability issues will be discussed. . No medication changes will be discussed.  Instructions: . Oral Intake: Do not eat or drink anything for at least 3 hours prior to your procedure. . Transportation: Unless otherwise stated by your physician, you may drive yourself after the procedure. . Blood Pressure Medicine: Take your blood pressure medicine with a sip of water the morning of the procedure. . Blood thinners: Notify our staff if you are taking any blood thinners. Depending on which one you take, there will be specific instructions on how and when to stop it. . Diabetics on insulin: Notify the staff so that you can be scheduled 1st case in the morning. If your diabetes requires high dose insulin, take only  of your normal insulin dose the morning of the procedure and notify the staff that you have done so. . Preventing infections: Shower with an antibacterial soap the morning of your procedure.  . Build-up your immune system: Take 1000 mg of Vitamin C with every meal (3 times a day) the day prior to your procedure. . Antibiotics: Inform the staff if you have a condition or reason that requires you to take antibiotics before dental procedures. . Pregnancy: If you are pregnant, call and cancel the procedure. . Sickness: If you have a cold, fever, or any active infections, call and cancel the procedure. . Arrival: You must be in the facility at least 30 minutes prior to your scheduled procedure. . Children: Do not bring any children with you. . Dress appropriately: Bring dark clothing that you would not mind if they get stained. . Valuables: Do not bring any jewelry or valuables.  Reasons to call and reschedule or cancel your procedure: (Following these  recommendations will minimize the risk of a serious complication.) . Surgeries: Avoid having procedures within 2 weeks of any surgery. (Avoid for 2 weeks before or after any surgery). . Flu Shots: Avoid having procedures within 2 weeks of a flu shots or . (Avoid for 2 weeks before or after immunizations). . Barium: Avoid having a procedure within 7-10 days after having had a radiological study involving the use of radiological contrast. (Myelograms, Barium swallow or enema study). . Heart attacks: Avoid any elective procedures or surgeries for the initial 6 months after a "Myocardial Infarction" (Heart Attack). . Blood thinners: It is imperative that you stop these medications before procedures. Let us know if you if you take any blood thinner.  . Infection: Avoid procedures during or within two weeks of an infection (including chest colds or gastrointestinal problems). Symptoms associated with infections include: Localized redness, fever, chills, night sweats or profuse sweating, burning sensation when voiding, cough, congestion, stuffiness, runny nose, sore throat, diarrhea, nausea, vomiting, cold or Flu symptoms, recent or current infections. It is specially important if the infection is over the area that we intend to treat. . Heart and lung problems: Symptoms that may suggest an active cardiopulmonary problem include: cough, chest pain, breathing difficulties or shortness of breath, dizziness, ankle swelling, uncontrolled high or unusually low blood pressure, and/or palpitations. If you are experiencing any of these symptoms, cancel your procedure and contact your primary care physician for an evaluation.  Remember:  Regular Business hours are:  Monday to Thursday 8:00 AM to 4:00 PM  Provider's Schedule: Kohlton Gilpatrick, MD:  Procedure days: Tuesday and Thursday 7:30 AM to 4:00 PM  Bilal   Lateef, MD:  Procedure days: Monday and Wednesday 7:30 AM to 4:00  PM ____________________________________________________________________________________________    

## 2019-08-10 DIAGNOSIS — H2513 Age-related nuclear cataract, bilateral: Secondary | ICD-10-CM | POA: Diagnosis not present

## 2019-08-10 LAB — MAGNESIUM: Magnesium: 2.3 mg/dL (ref 1.6–2.3)

## 2019-08-10 LAB — COMP. METABOLIC PANEL (12)
AST: 24 IU/L (ref 0–40)
Albumin/Globulin Ratio: 1.8 (ref 1.2–2.2)
Albumin: 4.4 g/dL (ref 3.8–4.8)
Alkaline Phosphatase: 78 IU/L (ref 39–117)
BUN/Creatinine Ratio: 11 — ABNORMAL LOW (ref 12–28)
BUN: 10 mg/dL (ref 8–27)
Bilirubin Total: 0.3 mg/dL (ref 0.0–1.2)
Calcium: 10.5 mg/dL — ABNORMAL HIGH (ref 8.7–10.3)
Chloride: 105 mmol/L (ref 96–106)
Creatinine, Ser: 0.87 mg/dL (ref 0.57–1.00)
GFR calc Af Amer: 79 mL/min/{1.73_m2} (ref >59)
GFR calc non Af Amer: 69 mL/min/{1.73_m2} (ref >59)
Globulin, Total: 2.5 g/dL (ref 1.5–4.5)
Glucose: 95 mg/dL (ref 65–99)
Potassium: 4.7 mmol/L (ref 3.5–5.2)
Sodium: 142 mmol/L (ref 134–144)
Total Protein: 6.9 g/dL (ref 6.0–8.5)

## 2019-08-10 LAB — C-REACTIVE PROTEIN: CRP: 3 mg/L (ref 0–10)

## 2019-08-10 LAB — 25-HYDROXY VITAMIN D LCMS D2+D3
25-Hydroxy, Vitamin D-2: <1 ng/mL
25-Hydroxy, Vitamin D-3: 39 ng/mL
25-Hydroxy, Vitamin D: 39 ng/mL

## 2019-08-10 LAB — VITAMIN B12: Vitamin B-12: 426 pg/mL (ref 232–1245)

## 2019-08-10 LAB — SEDIMENTATION RATE: Sed Rate: 12 mm/hr (ref 0–40)

## 2019-08-17 ENCOUNTER — Other Ambulatory Visit: Payer: Self-pay | Admitting: Pain Medicine

## 2019-08-17 ENCOUNTER — Ambulatory Visit
Admission: RE | Admit: 2019-08-17 | Discharge: 2019-08-17 | Disposition: A | Discharge status: Home / Self Care | Payer: Medicare Other | Source: Ambulatory Visit | Attending: Pain Medicine | Admitting: Pain Medicine

## 2019-08-17 ENCOUNTER — Other Ambulatory Visit: Payer: Self-pay

## 2019-08-17 ENCOUNTER — Ambulatory Visit (HOSPITAL_BASED_OUTPATIENT_CLINIC_OR_DEPARTMENT_OTHER): Payer: Medicare Other | Admitting: Pain Medicine

## 2019-08-17 ENCOUNTER — Encounter: Payer: Self-pay | Admitting: Pain Medicine

## 2019-08-17 VITALS — BP 148/89 | HR 98 | Temp 97.2°F | Resp 20 | Ht 66.0 in | Wt 145.0 lb

## 2019-08-17 DIAGNOSIS — M79652 Pain in left thigh: Secondary | ICD-10-CM | POA: Diagnosis not present

## 2019-08-17 DIAGNOSIS — G8929 Other chronic pain: Secondary | ICD-10-CM

## 2019-08-17 DIAGNOSIS — M7061 Trochanteric bursitis, right hip: Secondary | ICD-10-CM | POA: Insufficient documentation

## 2019-08-17 DIAGNOSIS — M25551 Pain in right hip: Secondary | ICD-10-CM | POA: Insufficient documentation

## 2019-08-17 DIAGNOSIS — M7062 Trochanteric bursitis, left hip: Secondary | ICD-10-CM

## 2019-08-17 DIAGNOSIS — M25552 Pain in left hip: Secondary | ICD-10-CM

## 2019-08-17 MED ORDER — ROPIVACAINE HCL 2 MG/ML IJ SOLN
INTRAMUSCULAR | Status: AC
  Filled 2019-08-17: qty 10

## 2019-08-17 MED ORDER — LIDOCAINE HCL 2 % IJ SOLN
INTRAMUSCULAR | Status: AC
  Filled 2019-08-17: qty 20

## 2019-08-17 MED ORDER — LIDOCAINE HCL 2 % IJ SOLN
20.0000 mL | Freq: Once | INTRAMUSCULAR | Status: AC
  Administered 2019-08-17: 400 mg

## 2019-08-17 MED ORDER — METHYLPREDNISOLONE ACETATE 80 MG/ML IJ SUSP
80.0000 mg | Freq: Once | INTRAMUSCULAR | Status: AC
  Administered 2019-08-17: 80 mg

## 2019-08-17 MED ORDER — METHYLPREDNISOLONE ACETATE 80 MG/ML IJ SUSP
INTRAMUSCULAR | Status: AC
  Filled 2019-08-17: qty 1

## 2019-08-17 MED ORDER — ROPIVACAINE HCL 2 MG/ML IJ SOLN
9.0000 mL | Freq: Once | INTRAMUSCULAR | Status: AC
  Administered 2019-08-17: 10 mL

## 2019-08-17 NOTE — Patient Instructions (Signed)

## 2019-08-17 NOTE — Progress Notes (Signed)
Patient's Name: Valerie Raymond  MRN: [id number]  Referring Provider: [name], MD  DOB: [birth year]  PCP: [name], MD  DOS: 08/17/2019  Note by: Gaspar Cola, MD  Service setting: Ambulatory outpatient  Specialty: Interventional Pain Management  Patient type: Established  Location: [facility] (AMB) Pain Management Facility  Visit type: Interventional Procedure   Primary Reason for Visit: Interventional Pain Management Treatment. CC: Hip Pain (left) and Leg Pain (left)  Procedure:          Anesthesia, Analgesia, Anxiolysis:  Type: Superficial Trochanteric Bursa Injection #1  Primary Purpose: Diagnostic Region: Upper (proximal) Femoral Region Level: Hip Joint Target Area: Superior aspect of the hip joint cavity, going thru the superior portion of the capsular ligament. Approach: Posterolateral approach Laterality: Left  Type: Local Anesthesia Indication(s): Analgesia         Local Anesthetic: Lidocaine 1-2% Route: Infiltration ([place]/IM) IV Access: Declined Sedation: Declined   Position: Lateral Decubitus with bad side up   Indications: 1. Trochanteric bursitis of both hips   2. Chronic thigh pain (Primary Area of Pain) (Left)   3. Chronic hip pain (Bilateral) (L>R)    Pain Score: Pre-procedure: 2 /10 Post-procedure: 0-No pain/10   Pre-op Assessment:  Valerie Raymond is a 68 y.o. (year old), female patient, seen today for interventional treatment. She  has a past surgical history that includes Colonoscopy (2006); Colonoscopy (N/A, 01/23/2015); Abdominal hysterectomy (1992); Salpingoophorectomy (Bilateral); Esophagogastroduodenoscopy (N/A, 04/21/2017); Esophageal dilation (04/21/2017); Esophagogastroduodenoscopy (egd) with propofol (N/A, 07/07/2017); Esophageal dilation (07/07/2017); Esophagogastroduodenoscopy (egd) with propofol (N/A, 07/28/2017); and Esophageal dilation (N/A, 07/28/2017). Valerie Raymond has a current medication list which includes the following prescription(s):  alendronate, artificial tears, calcium carbonate-vit d-min, vitamin d3, premarin, meloxicam, and pantoprazole. Her primarily concern today is the Hip Pain (left) and Leg Pain (left)  Initial Vital Signs:  Pulse/HCG Rate: 98ECG Heart Rate: (!) 101 Temp: (!) 97.2 F (36.2 C) Resp: 18 BP: (!) 158/94 SpO2: 99 %  BMI: Estimated body mass index is 23.4 kg/m as calculated from the following:   Height as of this encounter: 5\' 6"  (1.676 m).   Weight as of this encounter: 145 lb (65.8 kg).  Risk Assessment: Allergies: Reviewed. She is allergic to lodine [etodolac].  Allergy Precautions: None required Coagulopathies: Reviewed. None identified.  Blood-thinner therapy: None at this time Active Infection(s): Reviewed. None identified. Valerie Raymond is afebrile  Site Confirmation: Valerie Raymond was asked to confirm the procedure and laterality before marking the site Procedure checklist: Completed Consent: Before the procedure and under the influence of no sedative(s), amnesic(s), or anxiolytics, the patient was informed of the treatment options, risks and possible complications. To fulfill our ethical and legal obligations, as recommended by the American Medical Association's Code of Ethics, I have informed the patient of my clinical impression; the nature and purpose of the treatment or procedure; the risks, benefits, and possible complications of the intervention; the alternatives, including doing nothing; the risk(s) and benefit(s) of the alternative treatment(s) or procedure(s); and the risk(s) and benefit(s) of doing nothing. The patient was provided information about the general risks and possible complications associated with the procedure. These may include, but are not limited to: failure to achieve desired goals, infection, bleeding, organ or nerve damage, allergic reactions, paralysis, and death. In addition, the patient was informed of those risks and complications associated to the procedure, such as  failure to decrease pain; infection; bleeding; organ or nerve damage with subsequent damage to sensory, motor, and/or autonomic systems, resulting  in permanent pain, numbness, and/or weakness of one or several areas of the body; allergic reactions; (i.e.: anaphylactic reaction); and/or death. Furthermore, the patient was informed of those risks and complications associated with the medications. These include, but are not limited to: allergic reactions (i.e.: anaphylactic or anaphylactoid reaction(s)); adrenal axis suppression; blood sugar elevation that in diabetics may result in ketoacidosis or comma; water retention that in patients with history of congestive heart failure may result in shortness of breath, pulmonary edema, and decompensation with resultant heart failure; weight gain; swelling or edema; medication-induced neural toxicity; particulate matter embolism and blood vessel occlusion with resultant organ, and/or nervous system infarction; and/or aseptic necrosis of one or more joints. Finally, the patient was informed that Medicine is not an exact science; therefore, there is also the possibility of unforeseen or unpredictable risks and/or possible complications that may result in a catastrophic outcome. The patient indicated having understood very clearly. We have given the patient no guarantees and we have made no promises. Enough time was given to the patient to ask questions, all of which were answered to the patient's satisfaction. Valerie Raymond has indicated that she wanted to continue with the procedure. Attestation: I, the ordering provider, attest that I have discussed with the patient the benefits, risks, side-effects, alternatives, likelihood of achieving goals, and potential problems during recovery for the procedure that I have provided informed consent. Date   Time: 08/17/2019  9:43 AM  Pre-Procedure Preparation:  Monitoring: As per clinic protocol. Respiration, ETCO2, SpO2, BP, heart rate  and rhythm monitor placed and checked for adequate function Safety Precautions: Patient was assessed for positional comfort and pressure points before starting the procedure. Time-out: I initiated and conducted the "Time-out" before starting the procedure, as per protocol. The patient was asked to participate by confirming the accuracy of the "Time Out" information. Verification of the correct person, site, and procedure were performed and confirmed by me, the nursing staff, and the patient. "Time-out" conducted as per Joint Commission's Universal Protocol (UP.01.01.01). Time: 1006  Description of Procedure:          Area Prepped: Entire Posterolateral hip area. Prepping solution: DuraPrep (Iodine Povacrylex [0.7% available iodine] and Isopropyl Alcohol, 74% w/w) Safety Precautions: Aspiration looking for blood return was conducted prior to all injections. At no point did we inject any substances, as a needle was being advanced. No attempts were made at seeking any paresthesias. Safe injection practices and needle disposal techniques used. Medications properly checked for expiration dates. SDV (single dose vial) medications used. Description of the Procedure: Protocol guidelines were followed. The patient was placed in position over the procedure table. The target area was identified and the area prepped in the usual manner. Skin & deeper tissues infiltrated with local anesthetic. Appropriate amount of time allowed to pass for local anesthetics to take effect. The procedure needles were then advanced to the target area. Proper needle placement secured. Negative aspiration confirmed. Solution injected in intermittent fashion, asking for systemic symptoms every 0.5cc of injectate. The needles were then removed and the area cleansed, making sure to leave some of the prepping solution back to take advantage of its long term bactericidal properties. Vitals:   08/17/19 0942 08/17/19 1006 08/17/19 1009  BP: (!)  158/94 (!) 166/83 (!) 148/89  Pulse: 98    Resp: 18 20 20   Temp: (!) 97.2 F (36.2 C)    TempSrc: Temporal    SpO2: 99% 98% 97%  Weight: 145 lb (65.8 kg)  Height: 5\' 6"  (1.676 m)      Start Time: 1006 hrs. End Time: 1008 hrs.      Materials:  Needle(s) Type: Spinal Needle Gauge: 22G Length: 5.0-in Medication(s): Please see orders for medications and dosing details.  Imaging Guidance (Non-Spinal):          Type of Imaging Technique: Fluoroscopy Guidance (Non-Spinal) Indication(s): Assistance in needle guidance and placement for procedures requiring needle placement in or near specific anatomical locations not easily accessible without such assistance. Exposure Time: Please see nurses notes. Contrast: Before injecting any contrast, we confirmed that the patient did not have an allergy to iodine, shellfish, or radiological contrast. Once satisfactory needle placement was completed at the desired level, radiological contrast was injected. Contrast injected under live fluoroscopy. No contrast complications. See chart for type and volume of contrast used. Fluoroscopic Guidance: I was personally present during the use of fluoroscopy. "Tunnel Vision Technique" used to obtain the best possible view of the target area. Parallax error corrected before commencing the procedure. "Direction-depth-direction" technique used to introduce the needle under continuous pulsed fluoroscopy. Once target was reached, antero-posterior, oblique, and lateral fluoroscopic projection used confirm needle placement in all planes. Images permanently stored in EMR. Interpretation: I personally interpreted the imaging intraoperatively. Adequate needle placement confirmed in multiple planes. Appropriate spread of contrast into desired area was observed. No evidence of afferent or efferent intravascular uptake. Permanent images saved into the patient's record.  Antibiotic Prophylaxis:   Anti-infectives (From admission,  onward)   None     Indication(s): None identified  Post-operative Assessment:  Post-procedure Vital Signs:  Pulse/HCG Rate: 9894 Temp: (!) 97.2 F (36.2 C) Resp: 20 BP: (!) 148/89 SpO2: 97 %  EBL: None  Complications: No immediate post-treatment complications observed by team, or reported by patient.  Note: The patient tolerated the entire procedure well. A repeat set of vitals were taken after the procedure and the patient was kept under observation following institutional policy, for this type of procedure. Post-procedural neurological assessment was performed, showing return to baseline, prior to discharge. The patient was provided with post-procedure discharge instructions, including a section on how to identify potential problems. Should any problems arise concerning this procedure, the patient was given instructions to immediately contact us, at any time, without hesitation. In any case, we plan to contact the patient by telephone for a follow-up status report regarding this interventional procedure.  Comments:  No additional relevant information.  Plan of Care  Orders:  Orders Placed This Encounter  Procedures   Trochanteric Bursa inj.(Today)    Purpose: Diagnostic Indication: Hip pain 2ry to Trochanteric Burlitis left (M70.62).    Scheduling Instructions:     Procedure: Trochanteric bursa injection     Laterality: Left-sided     Sedation: No Sedation.     Timeframe: Today   Fluoro (C-Arm) (<60 min) (No Report)    Intraoperative interpretation by procedural physician at May.    Standing Status:   Standing    Number of Occurrences:   1    Order Specific Question:   Reason for exam:    Answer:   Assistance in needle guidance and placement for procedures requiring needle placement in or near specific anatomical locations not easily accessible without such assistance.   Consent: Hip inj.    Nursing Order: Transcribe to consent form and obtain patient  signature. Note: Always confirm laterality of pain with Ms. Milam, before procedure. Procedure: Hip injection Indication/Reason: Hip Joint Pain (Arthralgia) Provider Attestation:  I, Towner Dossie Arbour, MD, (Pain Management Specialist), the physician/practitioner, attest that I have discussed with the patient the benefits, risks, side effects, alternatives, likelihood of achieving goals and potential problems during recovery for the procedure that I have provided informed consent.   Block Tray    Equipment required: Single use, disposable, "Block Tray"    Standing Status:   Standing    Number of Occurrences:   1    Order Specific Question:   Specify    Answer:   Block Tray   Chronic Opioid Analgesic:  No opioid analgesics prescribed by our practice.   Medications ordered for procedure: Meds ordered this encounter  Medications   lidocaine (XYLOCAINE) 2 % (with pres) injection 400 mg   methylPREDNISolone acetate (DEPO-MEDROL) injection 80 mg   ropivacaine (PF) 2 mg/mL (0.2%) (NAROPIN) injection 9 mL   Medications administered: We administered lidocaine, methylPREDNISolone acetate, and ropivacaine (PF) 2 mg/mL (0.2%).  See the medical record for exact dosing, route, and time of administration.  Follow-up plan:   Return in about 2 weeks (around 08/31/2019) for (VV), (PP).       Interventional management options: Planned, scheduled, and/or pending:      Considering:   Diagnostic bilateral vs left-sided lumbar facet block  Possible bilateral vs left lumbar facet RFA    Palliative PRN treatment(s):   Diagnostic left trochanteric bursa injection #2     Recent Visits Date Type Provider Dept  08/04/19 Office Visit Milinda Pointer, MD [facility]  Showing recent visits within past 90 days and meeting all other requirements   Today's Visits Date Type Provider Dept  08/17/19 Procedure visit Milinda Pointer, MD [facility]  Showing today's visits and  meeting all other requirements   Future Appointments Date Type Provider Dept  08/30/19 Appointment Milinda Pointer, MD [facility]  Showing future appointments within next 90 days and meeting all other requirements   Disposition: Discharge home  Discharge Date & Time: 08/17/2019; 1020 hrs.   Primary Care Physician: [name], MD Location: [facility] Note by: Gaspar Cola, MD Date: 08/17/2019; Time: 10:46 AM  Disclaimer:  Medicine is not an exact science. The only guarantee in medicine is that nothing is guaranteed. It is important to note that the decision to proceed with this intervention was based on the information collected from the patient. The Data and conclusions were drawn from the patient's questionnaire, the interview, and the physical examination. Because the information was provided in large part by the patient, it cannot be guaranteed that it has not been purposely or unconsciously manipulated. Every effort has been made to obtain as much relevant data as possible for this evaluation. It is important to note that the conclusions that lead to this procedure are derived in large part from the available data. Always take into account that the treatment will also be dependent on availability of resources and existing treatment guidelines, considered by other Pain Management Practitioners as being common knowledge and practice, at the time of the intervention. For Medico-Legal purposes, it is also important to point out that variation in procedural techniques and pharmacological choices are the acceptable norm. The indications, contraindications, technique, and results of the above procedure should only be interpreted and judged by a Board-Certified Interventional Pain Specialist with extensive familiarity and expertise in the same exact procedure and technique.

## 2019-08-18 ENCOUNTER — Telehealth: Payer: Self-pay

## 2019-08-18 NOTE — Telephone Encounter (Signed)
Post procedure phone call.  Patient states she is doing fine.  Has some soreness at the injection site.  Instructed to place heat at injection site.  Instructed to call for any questions or concerns.

## 2019-08-25 DIAGNOSIS — M5416 Radiculopathy, lumbar region: Secondary | ICD-10-CM | POA: Diagnosis not present

## 2019-08-25 DIAGNOSIS — M545 Low back pain: Secondary | ICD-10-CM | POA: Diagnosis not present

## 2019-08-26 ENCOUNTER — Encounter: Payer: Self-pay | Admitting: Pain Medicine

## 2019-08-26 NOTE — Progress Notes (Signed)
Pain relief after procedure (treated area only): (Questions asked to patient) 1. Starting about 15 minutes after the procedure, and "while the area was still numb" (from the local anesthetics), were you having any of your usual pain "in that area" (the treated area)?  (NOTE: NOT including the discomfort from the needle sticks.) First 1 hour: 0 % better. First 4-6 hours: 100 % better. 3. How much better is your pain now, when compared to before the procedure? Current benefit: 50 % better. 4. Can you move better now? Improvement in ROM (Range of Motion): Yes. 5. Can you do more now? Improvement in function: Yes. 4. Did you have any problems with the procedure? Side-effects/Complications: No.

## 2019-08-29 NOTE — Progress Notes (Signed)
Pain Management Virtual Encounter Note - Virtual Visit via [place] (real-time audio visits between healthcare provider and patient).   Patient's Phone No. & Preferred Pharmacy:  [phone] (home); [phone] (mobile); (Preferred) [phone] [email or web address]  [facility] [id number] [facility], [place] [address] AT [place] [address] Phone: [phone] Fax: [phone]    Pre-screening note:  Our staff contacted Valerie Raymond and offered her an "in person", "face-to-face" appointment versus a telephone encounter. She indicated preferring the telephone encounter, at this time.   Reason for Virtual Visit: COVID-19*  Social distancing based on CDC and AMA recommendations.   I contacted Valerie Raymond on 08/30/2019 via video conference.      I clearly identified myself as Valerie Cola, MD. I verified that I was speaking with the correct person using two identifiers (Name: Valerie Raymond, and date of birth: December 17, 1950).  Advanced Informed Consent I sought verbal advanced consent from Valerie Raymond for virtual visit interactions. I informed Valerie Raymond of possible security and privacy concerns, risks, and limitations associated with providing "not-in-person" medical evaluation and management services. I also informed Valerie Raymond of the availability of "in-person" appointments. Finally, I informed her that there would be a charge for the virtual visit and that she could be  personally, fully or partially, financially responsible for it. Valerie Raymond expressed understanding and agreed to proceed.   Historic Elements   Valerie Raymond is a 68 y.o. year old, female patient evaluated today after her last encounter by our practice on 08/18/2019. Valerie Raymond  has a past medical history of Arthralgia of hip (07/23/2015), Arthritis, Degenerative disc disease, lumbar, DJD (degenerative joint disease), Dysphagia, Endometriosis, GERD  (gastroesophageal reflux disease), Heartburn, Joint pain, Osteoporosis, Pelvic pressure in female (01/02/2016), Rash, and Vitamin D deficiency. She also  has a past surgical history that includes Colonoscopy (2006); Colonoscopy (N/A, 01/23/2015); Abdominal hysterectomy (1992); Salpingoophorectomy (Bilateral); Esophagogastroduodenoscopy (N/A, 04/21/2017); Esophageal dilation (04/21/2017); Esophagogastroduodenoscopy (egd) with propofol (N/A, 07/07/2017); Esophageal dilation (07/07/2017); Esophagogastroduodenoscopy (egd) with propofol (N/A, 07/28/2017); and Esophageal dilation (N/A, 07/28/2017). Valerie Raymond has a current medication list which includes the following prescription(s): alendronate, artificial tears, calcium carbonate-vit d-min, vitamin d3, premarin, meloxicam, and pantoprazole. She  reports that she has never smoked. She has never used smokeless tobacco. She reports that she does not drink alcohol or use drugs. Valerie Raymond is allergic to lodine [etodolac].   HPI  Today, she is being contacted for a post-procedure assessment.  According to the patient, she attained some partial relief with the diagnostic injection suggesting that not all the pain is coming from the trochanteric bursa itself.  Furthermore, she identified the pain is returning when she stood up suggesting that pressure on the joint triggers the pain, which would be typical of intra-articular etiology to the pain.  In view of this, we have decided to proceed with a diagnostic intra-articular hip joint injection to determine if that is the case.  Once again, I have explained to the patient the process of the diagnostic injections.  She understood and she has agreed with the plan  Post-Procedure Evaluation  Procedure: Diagnostic left trochanteric bursa injection #1 under fluoroscopic guidance, no sedation Pre-procedure pain level:  2/10 Post-procedure: 0/10 (100% relief)  Sedation: None.  Valerie Shorter, RN  08/26/2019  1:28 PM  Signed Pain relief  after procedure (treated area only): (Questions asked to patient) 1. Starting about 60  minutes after the procedure, and "while the area was still numb" (from the local anesthetics), were you having any of your usual pain "in that area" (the treated area)?  (NOTE: NOT including the discomfort from the needle sticks.) First 1 hour: 100 % better. First 4-6 hours: 50 % better. 3. How much better is your pain now, when compared to before the procedure? Current benefit: 50 % better. 4. Can you move better now? Improvement in ROM (Range of Motion): Yes. 5. Can you do more now? Improvement in function: Yes. 4. Did you have any problems with the procedure? Side-effects/Complications: No.  Current benefits: Defined as benefit that persist at this time.   Analgesia:  50% improved.  According to the patient she had the 50% improvement after the first hour, but as soon as she stood up, some of the pain started coming back.  This would suggest that the area of discomfort may be the hip joint itself rather than the trochanteric bursa.  Today I had the patient perform a Patrick maneuver at home and she indicated reproduction of her pain in the area of the hip joint as opposed to the lower back or sacroiliac joint area. Function: Valerie Raymond reports improvement in function ROM: Valerie Raymond reports improvement in ROM  Pharmacotherapy Assessment  Analgesic: No opioid analgesics prescribed by our practice.   Monitoring: Pharmacotherapy: No side-effects or adverse reactions reported. [place] PMP: PDMP reviewed during this encounter.       Compliance: No problems identified. Effectiveness: Clinically acceptable. Plan: Refer to "POC".  UDS: No results found for: SUMMARY Laboratory Chemistry Profile (12 mo)  Renal: 08/04/2019: BUN 10; BUN/Creatinine Ratio 11; Creatinine, Ser 0.87  Lab Results  Component Value Date   GFRAA 79 08/04/2019   GFRNONAA 69 08/04/2019   Hepatic: 08/04/2019: Albumin 4.4 Lab Results   Component Value Date   AST 24 08/04/2019   ALT 20 04/06/2019   Other: 08/04/2019: 25-Hydroxy, Vitamin D 39; 25-Hydroxy, Vitamin D-2 <1.0; 25-Hydroxy, Vitamin D-3 39; CRP 3; Sed Rate 12; Vitamin B-12 426 Note: Above Lab results reviewed.  Imaging  Fluoro (C-Arm) (<60 min) (No Report) Fluoro was used, but no Radiologist interpretation will be provided.  Please refer to "NOTES" tab for provider progress note.  Video Exam  Provocative Tests: Patrick's Maneuver: (+) for left hip arthralgia              Assessment  The primary encounter diagnosis was Chronic hip pain (Bilateral) (L>R). Diagnoses of Chronic thigh pain (Primary Area of Pain) (Left), Chronic low back pain (Secondary area of Pain) (Bilateral) w/o sciatica, and Chronic lower extremity pain (Third area of Pain) (Bilateral) (L>R) were also pertinent to this visit.  Plan of Care  Problem-specific:  No problem-specific Assessment & Plan notes found for this encounter.  I am having Eunice Blase. [place] "Gaye" maintain her Vitamin D3, Calcium Carbonate-Vit D-Min (CALCIUM 1200 PO), meloxicam, alendronate, Premarin, pantoprazole, and Artificial Tears.  Pharmacotherapy (Medications Ordered): No orders of the defined types were placed in this encounter.  Orders:  Orders Placed This Encounter  Procedures  . Hip injection (Schedule)    Standing Status:   Future    Standing Expiration Date:   09/29/2019    Scheduling Instructions:     Side: Left-sided     Sedation: No Sedation.     Timeframe: As soon as schedule allows   Follow-up plan:   Return for Procedure (no sedation): (L) IA Hip inj #1.      Interventional  management options: Planned, scheduled, and/or pending:   Diagnostic left intra-articular hip joint injection #1    Considering:   Diagnostic bilateral vs left-sided lumbar facet block  Possible bilateral vs left lumbar facet RFA    Palliative PRN treatment(s):   Diagnostic left trochanteric bursa injection #2      Recent Visits Date Type Provider Dept  08/17/19 Procedure visit Milinda Pointer, MD [facility]  08/04/19 Office Visit Milinda Pointer, MD [facility]  Showing recent visits within past 90 days and meeting all other requirements   Future Appointments No visits were found meeting these conditions.  Showing future appointments within next 90 days and meeting all other requirements   I discussed the assessment and treatment plan with the patient. The patient was provided an opportunity to ask questions and all were answered. The patient agreed with the plan and demonstrated an understanding of the instructions.  Patient advised to call back or seek an in-person evaluation if the symptoms or condition worsens.  Total duration of non-face-to-face encounter: 15 minutes.  Note by: Valerie Cola, MD Date: 08/30/2019; Time: 10:10 AM  Note: This dictation was prepared with Dragon dictation. Any transcriptional errors that may result from this process are unintentional.  Disclaimer:  * Given the special circumstances of the COVID-19 pandemic, the federal government has announced that the Office for Civil Rights (OCR) will exercise its enforcement discretion and will not impose penalties on physicians using telehealth in the event of noncompliance with regulatory requirements under the [place] (HIPAA) in connection with the good faith provision of telehealth during the XX123456 national public health emergency. ([place])

## 2019-08-30 ENCOUNTER — Other Ambulatory Visit: Payer: Self-pay

## 2019-08-30 ENCOUNTER — Ambulatory Visit: Payer: Medicare Other | Attending: Pain Medicine | Admitting: Pain Medicine

## 2019-08-30 DIAGNOSIS — M79604 Pain in right leg: Secondary | ICD-10-CM | POA: Diagnosis not present

## 2019-08-30 DIAGNOSIS — M25551 Pain in right hip: Secondary | ICD-10-CM | POA: Diagnosis not present

## 2019-08-30 DIAGNOSIS — M79652 Pain in left thigh: Secondary | ICD-10-CM | POA: Diagnosis not present

## 2019-08-30 DIAGNOSIS — M545 Low back pain, unspecified: Secondary | ICD-10-CM

## 2019-08-30 DIAGNOSIS — M79605 Pain in left leg: Secondary | ICD-10-CM | POA: Diagnosis not present

## 2019-08-30 DIAGNOSIS — M25552 Pain in left hip: Secondary | ICD-10-CM

## 2019-08-30 DIAGNOSIS — G8929 Other chronic pain: Secondary | ICD-10-CM

## 2019-08-30 NOTE — Patient Instructions (Signed)
____________________________________________________________________________________________  Preparing for your procedure (without sedation)  Procedure appointments are limited to planned procedures: . No Prescription Refills. . No disability issues will be discussed. . No medication changes will be discussed.  Instructions: . Oral Intake: Do not eat or drink anything for at least 3 hours prior to your procedure. . Transportation: Unless otherwise stated by your physician, you may drive yourself after the procedure. . Blood Pressure Medicine: Take your blood pressure medicine with a sip of water the morning of the procedure. . Blood thinners: Notify our staff if you are taking any blood thinners. Depending on which one you take, there will be specific instructions on how and when to stop it. . Diabetics on insulin: Notify the staff so that you can be scheduled 1st case in the morning. If your diabetes requires high dose insulin, take only  of your normal insulin dose the morning of the procedure and notify the staff that you have done so. . Preventing infections: Shower with an antibacterial soap the morning of your procedure.  . Build-up your immune system: Take 1000 mg of Vitamin C with every meal (3 times a day) the day prior to your procedure. . Antibiotics: Inform the staff if you have a condition or reason that requires you to take antibiotics before dental procedures. . Pregnancy: If you are pregnant, call and cancel the procedure. . Sickness: If you have a cold, fever, or any active infections, call and cancel the procedure. . Arrival: You must be in the facility at least 30 minutes prior to your scheduled procedure. . Children: Do not bring any children with you. . Dress appropriately: Bring dark clothing that you would not mind if they get stained. . Valuables: Do not bring any jewelry or valuables.  Reasons to call and reschedule or cancel your procedure: (Following these  recommendations will minimize the risk of a serious complication.) . Surgeries: Avoid having procedures within 2 weeks of any surgery. (Avoid for 2 weeks before or after any surgery). . Flu Shots: Avoid having procedures within 2 weeks of a flu shots or . (Avoid for 2 weeks before or after immunizations). . Barium: Avoid having a procedure within 7-10 days after having had a radiological study involving the use of radiological contrast. (Myelograms, Barium swallow or enema study). . Heart attacks: Avoid any elective procedures or surgeries for the initial 6 months after a "Myocardial Infarction" (Heart Attack). . Blood thinners: It is imperative that you stop these medications before procedures. Let us know if you if you take any blood thinner.  . Infection: Avoid procedures during or within two weeks of an infection (including chest colds or gastrointestinal problems). Symptoms associated with infections include: Localized redness, fever, chills, night sweats or profuse sweating, burning sensation when voiding, cough, congestion, stuffiness, runny nose, sore throat, diarrhea, nausea, vomiting, cold or Flu symptoms, recent or current infections. It is specially important if the infection is over the area that we intend to treat. . Heart and lung problems: Symptoms that may suggest an active cardiopulmonary problem include: cough, chest pain, breathing difficulties or shortness of breath, dizziness, ankle swelling, uncontrolled high or unusually low blood pressure, and/or palpitations. If you are experiencing any of these symptoms, cancel your procedure and contact your primary care physician for an evaluation.  Remember:  Regular Business hours are:  Monday to Thursday 8:00 AM to 4:00 PM  Provider's Schedule: Keron Neenan, MD:  Procedure days: Tuesday and Thursday 7:30 AM to 4:00 PM  Bilal   Lateef, MD:  Procedure days: Monday and Wednesday 7:30 AM to 4:00  PM ____________________________________________________________________________________________    

## 2019-09-07 ENCOUNTER — Other Ambulatory Visit: Payer: Self-pay

## 2019-09-07 ENCOUNTER — Ambulatory Visit
Admission: RE | Admit: 2019-09-07 | Discharge: 2019-09-07 | Disposition: A | Discharge status: Home / Self Care | Payer: Medicare Other | Source: Ambulatory Visit | Attending: Pain Medicine | Admitting: Pain Medicine

## 2019-09-07 ENCOUNTER — Ambulatory Visit (HOSPITAL_BASED_OUTPATIENT_CLINIC_OR_DEPARTMENT_OTHER): Payer: Medicare Other | Admitting: Pain Medicine

## 2019-09-07 ENCOUNTER — Encounter: Payer: Self-pay | Admitting: Pain Medicine

## 2019-09-07 VITALS — BP 150/93 | HR 89 | Temp 97.4°F | Resp 21 | Ht 66.0 in | Wt 143.0 lb

## 2019-09-07 DIAGNOSIS — M25552 Pain in left hip: Secondary | ICD-10-CM

## 2019-09-07 DIAGNOSIS — M25551 Pain in right hip: Secondary | ICD-10-CM | POA: Diagnosis not present

## 2019-09-07 DIAGNOSIS — G8929 Other chronic pain: Secondary | ICD-10-CM

## 2019-09-07 DIAGNOSIS — M79652 Pain in left thigh: Secondary | ICD-10-CM | POA: Insufficient documentation

## 2019-09-07 DIAGNOSIS — R208 Other disturbances of skin sensation: Secondary | ICD-10-CM | POA: Insufficient documentation

## 2019-09-07 MED ORDER — METHYLPREDNISOLONE ACETATE 80 MG/ML IJ SUSP
INTRAMUSCULAR | Status: AC
  Filled 2019-09-07: qty 1

## 2019-09-07 MED ORDER — IOHEXOL 180 MG/ML  SOLN
10.0000 mL | Freq: Once | INTRAMUSCULAR | Status: AC
  Administered 2019-09-07: 10 mL via INTRA_ARTICULAR

## 2019-09-07 MED ORDER — ROPIVACAINE HCL 2 MG/ML IJ SOLN
INTRAMUSCULAR | Status: AC
  Filled 2019-09-07: qty 10

## 2019-09-07 MED ORDER — LIDOCAINE HCL 2 % IJ SOLN
INTRAMUSCULAR | Status: AC
  Filled 2019-09-07: qty 20

## 2019-09-07 MED ORDER — IOHEXOL 180 MG/ML  SOLN
INTRAMUSCULAR | Status: AC
  Filled 2019-09-07: qty 20

## 2019-09-07 MED ORDER — METHYLPREDNISOLONE ACETATE 80 MG/ML IJ SUSP
80.0000 mg | Freq: Once | INTRAMUSCULAR | Status: AC
  Administered 2019-09-07: 09:00:00 80 mg via INTRA_ARTICULAR

## 2019-09-07 MED ORDER — LIDOCAINE HCL 2 % IJ SOLN
20.0000 mL | Freq: Once | INTRAMUSCULAR | Status: AC
  Administered 2019-09-07: 400 mg

## 2019-09-07 MED ORDER — ROPIVACAINE HCL 2 MG/ML IJ SOLN
9.0000 mL | Freq: Once | INTRAMUSCULAR | Status: AC
  Administered 2019-09-07: 9 mL via INTRA_ARTICULAR

## 2019-09-07 NOTE — Progress Notes (Signed)
Safety precautions to be maintained throughout the outpatient stay will include: orient to surroundings, keep bed in low position, maintain call bell within reach at all times, provide assistance with transfer out of bed and ambulation.  

## 2019-09-07 NOTE — Patient Instructions (Signed)

## 2019-09-07 NOTE — Progress Notes (Signed)
PROVIDER NOTE: Information contained herein reflects review and annotations entered in association with encounter. Patient information is provided elsewhere in the medical record. Interpretation of information and data should be left to medically trained personnel. Document created using STT technology, any transcriptional errors that may result from process are unintentional. Patient's Name: Valerie Raymond  MRN: [id number]  Referring Provider: [name], MD  DOB: [birth year]  PCP: [name], MD  DOS: 09/07/2019  Note by: Gaspar Cola, MD  Service setting: Ambulatory outpatient  Specialty: Interventional Pain Management  Patient type: Established  Location: [facility] (AMB) Pain Management Facility  Visit type: Interventional Procedure   Primary Reason for Visit: Interventional Pain Management Treatment. CC: Hip Pain (left)  Procedure:          Anesthesia, Analgesia, Anxiolysis:  Type: Intra-Articular Hip Injection #1  Primary Purpose: Diagnostic Region: Posterolateral hip joint area. Level: Lower pelvic and hip joint level. Target Area: Superior aspect of the hip joint cavity, going thru the superior portion of the capsular ligament. Approach: Posterolateral approach. Laterality: Left  Type: Local Anesthesia Indication(s): Analgesia         Route: Infiltration ([place]/IM) IV Access: Declined Sedation: Declined  Local Anesthetic: Lidocaine 1-2%  Position: Lateral Decubitus with bad side up Prepped Area: Entire Posterolateral hip area. Prepping solution: DuraPrep (Iodine Povacrylex [0.7% available iodine] and Isopropyl Alcohol, 74% w/w)   Indications: 1. Chronic hip pain (Bilateral) (L>R)   2. Chronic thigh pain (Primary Area of Pain) (Left)   3. Lower extremity burning sensation (thigh) (Left)    Pain Score: Pre-procedure: 1 /10 Post-procedure: 0-No pain/10   Pre-op Assessment:  Valerie Raymond is a 68 y.o. (year old), female patient, seen today for interventional  treatment. She  has a past surgical history that includes Colonoscopy (2006); Colonoscopy (N/A, 01/23/2015); Abdominal hysterectomy (1992); Salpingoophorectomy (Bilateral); Esophagogastroduodenoscopy (N/A, 04/21/2017); Esophageal dilation (04/21/2017); Esophagogastroduodenoscopy (egd) with propofol (N/A, 07/07/2017); Esophageal dilation (07/07/2017); Esophagogastroduodenoscopy (egd) with propofol (N/A, 07/28/2017); and Esophageal dilation (N/A, 07/28/2017). Valerie Raymond has a current medication list which includes the following prescription(s): alendronate, artificial tears, calcium carbonate-vit d-min, vitamin d3, premarin, meloxicam, and pantoprazole. Her primarily concern today is the Hip Pain (left)  Initial Vital Signs:  Pulse/HCG Rate: 89ECG Heart Rate: 83 Temp: (!) 97.4 F (36.3 C) Resp: 16 BP: 135/84 SpO2: 98 %  BMI: Estimated body mass index is 23.08 kg/m as calculated from the following:   Height as of this encounter: 5\' 6"  (1.676 m).   Weight as of this encounter: 143 lb (64.9 kg).  Risk Assessment: Allergies: Reviewed. She is allergic to lodine [etodolac].  Allergy Precautions: None required Coagulopathies: Reviewed. None identified.  Blood-thinner therapy: None at this time Active Infection(s): Reviewed. None identified. Valerie Raymond is afebrile  Site Confirmation: Valerie Raymond was asked to confirm the procedure and laterality before marking the site Procedure checklist: Completed Consent: Before the procedure and under the influence of no sedative(s), amnesic(s), or anxiolytics, the patient was informed of the treatment options, risks and possible complications. To fulfill our ethical and legal obligations, as recommended by the American Medical Association's Code of Ethics, I have informed the patient of my clinical impression; the nature and purpose of the treatment or procedure; the risks, benefits, and possible complications of the intervention; the alternatives, including doing nothing; the  risk(s) and benefit(s) of the alternative treatment(s) or procedure(s); and the risk(s) and benefit(s) of doing nothing. The patient was provided information about the general risks and possible complications associated with  the procedure. These may include, but are not limited to: failure to achieve desired goals, infection, bleeding, organ or nerve damage, allergic reactions, paralysis, and death. In addition, the patient was informed of those risks and complications associated to the procedure, such as failure to decrease pain; infection; bleeding; organ or nerve damage with subsequent damage to sensory, motor, and/or autonomic systems, resulting in permanent pain, numbness, and/or weakness of one or several areas of the body; allergic reactions; (i.e.: anaphylactic reaction); and/or death. Furthermore, the patient was informed of those risks and complications associated with the medications. These include, but are not limited to: allergic reactions (i.e.: anaphylactic or anaphylactoid reaction(s)); adrenal axis suppression; blood sugar elevation that in diabetics may result in ketoacidosis or comma; water retention that in patients with history of congestive heart failure may result in shortness of breath, pulmonary edema, and decompensation with resultant heart failure; weight gain; swelling or edema; medication-induced neural toxicity; particulate matter embolism and blood vessel occlusion with resultant organ, and/or nervous system infarction; and/or aseptic necrosis of one or more joints. Finally, the patient was informed that Medicine is not an exact science; therefore, there is also the possibility of unforeseen or unpredictable risks and/or possible complications that may result in a catastrophic outcome. The patient indicated having understood very clearly. We have given the patient no guarantees and we have made no promises. Enough time was given to the patient to ask questions, all of which were  answered to the patient's satisfaction. Valerie Raymond has indicated that she wanted to continue with the procedure. Attestation: I, the ordering provider, attest that I have discussed with the patient the benefits, risks, side-effects, alternatives, likelihood of achieving goals, and potential problems during recovery for the procedure that I have provided informed consent. Date  Time: 09/07/2019  8:23 AM  Pre-Procedure Preparation:  Monitoring: As per clinic protocol. Respiration, ETCO2, SpO2, BP, heart rate and rhythm monitor placed and checked for adequate function Safety Precautions: Patient was assessed for positional comfort and pressure points before starting the procedure. Time-out: I initiated and conducted the "Time-out" before starting the procedure, as per protocol. The patient was asked to participate by confirming the accuracy of the "Time Out" information. Verification of the correct person, site, and procedure were performed and confirmed by me, the nursing staff, and the patient. "Time-out" conducted as per Joint Commission's Universal Protocol (UP.01.01.01). Time: 0900  Description of Procedure:          Safety Precautions: Aspiration looking for blood return was conducted prior to all injections. At no point did we inject any substances, as a needle was being advanced. No attempts were made at seeking any paresthesias. Safe injection practices and needle disposal techniques used. Medications properly checked for expiration dates. SDV (single dose vial) medications used. Description of the Procedure: Protocol guidelines were followed. The patient was placed in position over the fluoroscopy table. The target area was identified and the area prepped in the usual manner. Skin & deeper tissues infiltrated with local anesthetic. Appropriate amount of time allowed to pass for local anesthetics to take effect. The procedure needles were then advanced to the target area. Proper needle placement  secured. Negative aspiration confirmed. Solution injected in intermittent fashion, asking for systemic symptoms every 0.5cc of injectate. The needles were then removed and the area cleansed, making sure to leave some of the prepping solution back to take advantage of its long term bactericidal properties. Vitals:   09/07/19 [id number] 09/07/19 0900 09/07/19 0904 09/07/19 [id number]  BP: 135/84 (!) 158/84 (!) 148/79 (!) 150/93  Pulse: 89     Resp: 16 18 19  (!) 21  Temp: (!) 97.4 F (36.3 C)     SpO2: 98% 99% 100% 100%  Weight: 143 lb (64.9 kg)     Height: 5\' 6"  (1.676 m)       Start Time: 0900 hrs. End Time: 0907 hrs. Materials:  Needle(s) Type: Spinal Needle Gauge: 22G Length: 5.0-in Medication(s): Please see orders for medications and dosing details.  Imaging Guidance (Non-Spinal):          Type of Imaging Technique: Fluoroscopy Guidance (Non-Spinal) Indication(s): Assistance in needle guidance and placement for procedures requiring needle placement in or near specific anatomical locations not easily accessible without such assistance. Exposure Time: Please see nurses notes. Contrast: Before injecting any contrast, we confirmed that the patient did not have an allergy to iodine, shellfish, or radiological contrast. Once satisfactory needle placement was completed at the desired level, radiological contrast was injected. Contrast injected under live fluoroscopy. No contrast complications. See chart for type and volume of contrast used. Fluoroscopic Guidance: I was personally present during the use of fluoroscopy. "Tunnel Vision Technique" used to obtain the best possible view of the target area. Parallax error corrected before commencing the procedure. "Direction-depth-direction" technique used to introduce the needle under continuous pulsed fluoroscopy. Once target was reached, antero-posterior, oblique, and lateral fluoroscopic projection used confirm needle placement in all planes. Images permanently  stored in EMR. Interpretation: I personally interpreted the imaging intraoperatively. Adequate needle placement confirmed in multiple planes. Appropriate spread of contrast into desired area was observed. No evidence of afferent or efferent intravascular uptake. Permanent images saved into the patient's record.  Antibiotic Prophylaxis:   Anti-infectives (From admission, onward)   None     Indication(s): None identified  Post-operative Assessment:  Post-procedure Vital Signs:  Pulse/HCG Rate: 8988 Temp: (!) 97.4 F (36.3 C) Resp: (!) 21 BP: (!) 150/93 SpO2: 100 %  EBL: None  Complications: No immediate post-treatment complications observed by team, or reported by patient.  Note: The patient tolerated the entire procedure well. A repeat set of vitals were taken after the procedure and the patient was kept under observation following institutional policy, for this type of procedure. Post-procedural neurological assessment was performed, showing return to baseline, prior to discharge. The patient was provided with post-procedure discharge instructions, including a section on how to identify potential problems. Should any problems arise concerning this procedure, the patient was given instructions to immediately contact us, at any time, without hesitation. In any case, we plan to contact the patient by telephone for a follow-up status report regarding this interventional procedure.  Comments:  No additional relevant information.  Plan of Care  Orders:  Orders Placed This Encounter  Procedures  . Hip injection (Today)    Scheduling Instructions:     Side: Left-sided     Sedation: no Sedation.     Timeframe: Today  . Fluoro (C-Arm) (<60 min) (No Report)    Intraoperative interpretation by procedural physician at [facility].    Standing Status:   Standing    Number of Occurrences:   1    Order Specific Question:   Reason for exam:    Answer:   Assistance in needle  guidance and placement for procedures requiring needle placement in or near specific anatomical locations not easily accessible without such assistance.  . Consent: Hip inj.    Nursing Order: Transcribe to consent form and obtain patient  signature. Note: Always confirm laterality of pain with Ms. Branch, before procedure. Procedure: Hip injection Indication/Reason: Hip Joint Pain (Arthralgia) Provider Attestation: I, Evans Dossie Arbour, MD, (Pain Management Specialist), the physician/practitioner, attest that I have discussed with the patient the benefits, risks, side effects, alternatives, likelihood of achieving goals and potential problems during recovery for the procedure that I have provided informed consent.  Dwain Sarna Tray    Equipment required: Single use, disposable, "Block Tray"    Standing Status:   Standing    Number of Occurrences:   1    Order Specific Question:   Specify    Answer:   Block Tray   Chronic Opioid Analgesic:  No opioid analgesics prescribed by our practice.   Medications ordered for procedure: Meds ordered this encounter  Medications  . iohexol (OMNIPAQUE) 180 MG/ML injection 10 mL    Must be Myelogram-compatible. If not available, you may substitute with a water-soluble, non-ionic, hypoallergenic, myelogram-compatible radiological contrast medium.  Marland Kitchen lidocaine (XYLOCAINE) 2 % (with pres) injection 400 mg  . ropivacaine (PF) 2 mg/mL (0.2%) (NAROPIN) injection 9 mL  . methylPREDNISolone acetate (DEPO-MEDROL) injection 80 mg   Medications administered: We administered iohexol, lidocaine, ropivacaine (PF) 2 mg/mL (0.2%), and methylPREDNISolone acetate.  See the medical record for exact dosing, route, and time of administration.  Follow-up plan:   Return in about 2 weeks (around 09/21/2019) for (VV), (PP).       Interventional management options: Planned, scheduled, and/or pending:   Diagnostic left intra-articular hip joint injection #1    Considering:    Diagnostic bilateral vs left-sided lumbar facet block  Possible bilateral vs left lumbar facet RFA    Palliative PRN treatment(s):   Diagnostic left trochanteric bursa injection #2      Recent Visits Date Type Provider Dept  08/17/19 Procedure visit Milinda Pointer, MD [facility]  08/04/19 Office Visit Milinda Pointer, MD [facility]  Showing recent visits within past 90 days and meeting all other requirements   Today's Visits Date Type Provider Dept  09/07/19 Procedure visit Milinda Pointer, MD [facility]  Showing today's visits and meeting all other requirements   Future Appointments Date Type Provider Dept  09/21/19 Appointment Milinda Pointer, MD [facility]  Showing future appointments within next 90 days and meeting all other requirements   Disposition: Discharge home  Discharge Date & Time: 09/07/2019; 0915 hrs.   Primary Care Physician: [name], MD Location: [facility] Note by: Gaspar Cola, MD Date: 09/07/2019; Time: 10:00 AM  Disclaimer:  Medicine is not an Chief Strategy Officer. The only guarantee in medicine is that nothing is guaranteed. It is important to note that the decision to proceed with this intervention was based on the information collected from the patient. The Data and conclusions were drawn from the patient's questionnaire, the interview, and the physical examination. Because the information was provided in large part by the patient, it cannot be guaranteed that it has not been purposely or unconsciously manipulated. Every effort has been made to obtain as much relevant data as possible for this evaluation. It is important to note that the conclusions that lead to this procedure are derived in large part from the available data. Always take into account that the treatment will also be dependent on availability of resources and existing treatment guidelines,  considered by other Pain Management Practitioners as being common knowledge and practice, at the time of the intervention. For Medico-Legal purposes, it  is also important to point out that variation in procedural techniques and pharmacological choices are the acceptable norm. The indications, contraindications, technique, and results of the above procedure should only be interpreted and judged by a Board-Certified Interventional Pain Specialist with extensive familiarity and expertise in the same exact procedure and technique.

## 2019-09-08 ENCOUNTER — Telehealth: Payer: Self-pay

## 2019-09-08 NOTE — Telephone Encounter (Signed)
Post procedure phone call.  Patient states she is doing well.  

## 2019-09-08 NOTE — Telephone Encounter (Signed)
Post procedure phone call.   

## 2019-09-19 NOTE — Progress Notes (Signed)
Virtual Encounter - Pain Management PROVIDER NOTE: Information contained herein reflects review and annotations entered in association with encounter. Interpretation of such information and data should be left to medically-trained personnel. Information provided to patient can be located elsewhere in the medical record under "Patient Instructions". Document created using STT-dictation technology, any transcriptional errors that may result from process are unintentional.    Contact & Pharmacy Preferred: [place]: [phone] (home) Mobile: [phone] (mobile) E-mail: [email or web address]  [facility], [address] AT [place] [address] Phone: [phone] Fax: [phone]   Pre-screening  Valerie Raymond offered "in-person" vs "virtual" encounter. She indicated preferring virtual for this encounter.   Reason COVID-19*  Social distancing based on CDC and AMA recommendations.   I contacted Valerie Raymond on 09/21/2019 via telephone.      I clearly identified myself as Gaspar Cola, MD. I verified that I was speaking with the correct person using two identifiers (Name: Valerie Raymond, and date of birth: [birth year]).  Consent I sought verbal advanced consent from Valerie Raymond for virtual visit interactions. I informed Valerie Raymond of possible security and privacy concerns, risks, and limitations associated with providing "not-in-person" medical evaluation and management services. I also informed Valerie Raymond of the availability of "in-person" appointments. Finally, I informed her that there would be a charge for the virtual visit and that she could be  personally, fully or partially, financially responsible for it. Ms. Copps expressed understanding and agreed to proceed.   Historic Elements   Valerie Raymond is a 69 y.o. year old, female patient evaluated today after her last encounter by our practice on  09/08/2019. Valerie Raymond  has a past medical history of Arthralgia of hip (07/23/2015), Arthritis, Degenerative disc disease, lumbar, DJD (degenerative joint disease), Dysphagia, Endometriosis, GERD (gastroesophageal reflux disease), Heartburn, Joint pain, Osteoporosis, Pelvic pressure in female (01/02/2016), Rash, and Vitamin D deficiency. She also  has a past surgical history that includes Colonoscopy (2006); Colonoscopy (N/A, 01/23/2015); Abdominal hysterectomy (1992); Salpingoophorectomy (Bilateral); Esophagogastroduodenoscopy (N/A, 04/21/2017); Esophageal dilation (04/21/2017); Esophagogastroduodenoscopy (egd) with propofol (N/A, 07/07/2017); Esophageal dilation (07/07/2017); Esophagogastroduodenoscopy (egd) with propofol (N/A, 07/28/2017); and Esophageal dilation (N/A, 07/28/2017). Valerie Raymond has a current medication list which includes the following prescription(s): alendronate, artificial tears, calcium carbonate-vit d-min, vitamin d3, premarin, meloxicam, and pantoprazole. She  reports that she has never smoked. She has never used smokeless tobacco. She reports that she does not drink alcohol or use drugs. Valerie Raymond is allergic to lodine [etodolac].   HPI  Today, she is being contacted for a post-procedure assessment.  Today I spent over 45 minutes with this patient going over the results of the test, reviewing both of her hip x-rays, the x-rays of her lumbar spine, and her MRI.  As it turns out, the patient today contributed to her history the fact that she has been experiencing bilateral low back pain with the left side being worse than the right and this is always associated with this hip pain.  Today she was wondering if that had something to do with her hip pain.  This is why we went over her lumbar spine x-rays and the MRI.  The diagnostic x-rays of the lumbar spine show that she does not have any instability in the area of the lower back but she does have some dextroscoliosis.  Reviewing her MRI, I see that she  has L2-3  lateral recess stenosis, which it could be affecting the L2 or the L3 nerves depending how lateral it is.  We talked about what the lateral recess stenosis was and we also talked about her lumbar facet arthropathy and how that can contribute to all of this.  I informed the patient that if she was having problems with the L2 and/or L3 nerve she could be experiencing pain in the lower back and hip areas, respectively, but more so on the left side since this seems to be only left-sided.  However, because her symptoms are bilateral and she does have it evidence on the MRI of bilateral facet arthropathy, today had her stand up and hyperextend backwards at the level of the spine.  She indicated that this provocative maneuver significantly worsen her low back pain and that that is the type of maneuver that she normally tries to avoid precisely because of what it does to her back pain.  However, when she hyperextended without rotating, her pain was limited only to the lower portion of the back.  I then asked the patient to hyperextend and rotate the lumbar spine and this exactly reproduced her low back pain and pain going down her hip area.  In view of this, I went on to explain to her the possibility of lumbar facet pain and lumbar facet syndrome (which would include the lower extremity symptoms) and what is it that we would have to do to test if this is in fact were some of this pain is coming from.  Today we have decided to move onto performing a diagnostic bilateral lumbar facet block under fluoroscopic guidance and IV sedation to test and see if in fact were dealing with a lumbar facet syndrome.  After carefully having explained all this to the patient, she understood the logic of it and has agreed with the plan.  Post-Procedure Evaluation  Procedure: Diagnostic left intra-articular hip joint injection #1 under fluoroscopic guidance, no sedation Pre-procedure pain level:  1/10 Post-procedure: 0/10 (100%  relief)  Sedation: None.  Dewayne Shorter, RN  09/20/2019 10:59 AM  Sign when Signing Visit Pain relief after procedure (treated area only): (Questions asked to patient) 1. Starting about 15 minutes after the procedure, and "while the area was still numb" (from the local anesthetics), were you having any of your usual pain "in that area" (the treated area)?  (NOTE: NOT including the discomfort from the needle sticks.) First 1 hour: 100% better. First 4-6 hours: 100 % better. 2. How long did the numbness from the local anesthetics last? (More than 6 hours?) Duration: 6 hours.  3. How much better is your pain now, when compared to before the procedure? Current benefit: 0 % better. 4. Can you move better now? Improvement in ROM (Range of Motion): No. 5. Can you do more now? Improvement in function: No. 4. Did you have any problems with the procedure? Side-effects/Complications: No.  Current benefits: Defined as benefit that persist at this time.   Analgesia:  The patient indicates that this particular procedure did not contribute any further to the benefits that she had already received with the first hip injection.  Because of this, she has rated the current benefit from the procedure as being a 0% but she made it a point to clarify that it is still 50% better than how it was when she first came in. Function: No significant improvement.  She still refers that when she stands for prolonged period time, she gets the  pain in the area of the hip but today she has contributed some additional information indicating that this is always associated with low back pain as well. ROM: No improvement  Pharmacotherapy Assessment  Analgesic: No opioid analgesics prescribed by our practice.   Monitoring: Pharmacotherapy: No side-effects or adverse reactions reported. [place] PMP: PDMP reviewed during this encounter.       Compliance: No problems identified. Effectiveness: Clinically acceptable. Plan: Refer to  "POC".  UDS: No results found for: SUMMARY Laboratory Chemistry Profile (12 mo)  Renal: 08/04/2019: BUN 10; BUN/Creatinine Ratio 11; Creatinine, Ser 0.87  Lab Results  Component Value Date   GFRAA 79 08/04/2019   GFRNONAA 69 08/04/2019   Hepatic: 08/04/2019: Albumin 4.4 Lab Results  Component Value Date   AST 24 08/04/2019   ALT 20 04/06/2019   Other: 08/04/2019: 25-Hydroxy, Vitamin D 39; 25-Hydroxy, Vitamin D-2 <1.0; 25-Hydroxy, Vitamin D-3 39; CRP 3; Sed Rate 12; Vitamin B-12 426 Note: Above Lab results reviewed.  Imaging  Fluoro (C-Arm) (<60 min) (No Report) Fluoro was used, but no Radiologist interpretation will be provided.  Please refer to "NOTES" tab for provider progress note.  Assessment  The primary encounter diagnosis was Chronic low back pain (Secondary area of Pain) (Bilateral) (L>R) w/o sciatica. Diagnoses of Lumbar facet syndrome (Bilateral) (L>R), Lumbar facet arthropathy (Multilevel) (Bilateral), Grade 1 Lumbar Retrolisthesis L3/L4, and Chronic hip pain (Bilateral) (L>R) were also pertinent to this visit.  Plan of Care  Problem-specific:  No problem-specific Assessment & Plan notes found for this encounter.  I am having Valerie Blase. Fulghum "Gaye" maintain her Vitamin D3, Calcium Carbonate-Vit D-Min (CALCIUM 1200 PO), meloxicam, alendronate, Premarin, pantoprazole, and Artificial Tears.  Pharmacotherapy (Medications Ordered): No orders of the defined types were placed in this encounter.  Orders:  Orders Placed This Encounter  Procedures  . L-FCT Blk (Schedule)    Standing Status:   Future    Standing Expiration Date:   10/22/2019    Scheduling Instructions:     Procedure: Lumbar facet block (AKA.: Lumbosacral medial branch nerve block)     Side: Bilateral     Level: L3-4, L4-5, & L5-S1 Facets (L2, L3, L4, L5, & S1 Medial Branch Nerves)     Sedation: Patient's choice.     Timeframe: ASAA    Order Specific Question:   Where will this procedure be performed?     Answer:   [facility]   Follow-up plan:   Return for Procedure (w/ sedation): (B) L-FCT BLK #1.      Interventional management options: Planned, scheduled, and/or pending:   Diagnostic left intra-articular hip joint injection #1    Considering:   Diagnostic bilateral lumbar facet block  Possible bilateral vs left lumbar facet RFA    Palliative PRN treatment(s):   Diagnostic left trochanteric bursa injection #2     Recent Visits Date Type Provider Dept  09/07/19 Procedure visit Milinda Pointer, MD [facility]  08/17/19 Procedure visit Milinda Pointer, MD [facility]  08/04/19 Office Visit Milinda Pointer, MD [facility]  Showing recent visits within past 90 days and meeting all other requirements   Today's Visits Date Type Provider Dept  09/21/19 Telemedicine Milinda Pointer, MD [facility]  Showing today's visits and meeting all other requirements   Future Appointments No visits were found meeting these conditions.  Showing future appointments within next 90 days and meeting all other requirements   I discussed the assessment and treatment plan with the patient. The  patient was provided an opportunity to ask questions and all were answered. The patient agreed with the plan and demonstrated an understanding of the instructions.  Patient advised to call back or seek an in-person evaluation if the symptoms or condition worsens.  Total duration of non-face-to-face encounter: 45 minutes.  Note by: Gaspar Cola, MD Date: 09/21/2019; Time: 1:42 PM

## 2019-09-20 ENCOUNTER — Encounter: Payer: Self-pay | Admitting: Pain Medicine

## 2019-09-20 NOTE — Progress Notes (Signed)
Pain relief after procedure (treated area only): (Questions asked to patient) 1. Starting about 15 minutes after the procedure, and "while the area was still numb" (from the local anesthetics), were you having any of your usual pain "in that area" (the treated area)?  (NOTE: NOT including the discomfort from the needle sticks.) First 1 hour: 100% better. First 4-6 hours:100 % better. 2. How long did the numbness from the local anesthetics last? (More than 6 hours?) Duration:6 hours.  3. How much better is your pain now, when compared to before the procedure? Current benefit:0 % better. 4. Can you move better now? Improvement in ROM (Range of Motion): No. 5. Can you do more now? Improvement in function: No. 4. Did you have any problems with the procedure? Side-effects/Complications: No.

## 2019-09-21 ENCOUNTER — Ambulatory Visit: Payer: Medicare Other | Attending: Pain Medicine | Admitting: Pain Medicine

## 2019-09-21 ENCOUNTER — Other Ambulatory Visit: Payer: Self-pay

## 2019-09-21 DIAGNOSIS — M545 Low back pain, unspecified: Secondary | ICD-10-CM

## 2019-09-21 DIAGNOSIS — M25552 Pain in left hip: Secondary | ICD-10-CM

## 2019-09-21 DIAGNOSIS — M25551 Pain in right hip: Secondary | ICD-10-CM | POA: Diagnosis not present

## 2019-09-21 DIAGNOSIS — M4310 Spondylolisthesis, site unspecified: Secondary | ICD-10-CM

## 2019-09-21 DIAGNOSIS — G8929 Other chronic pain: Secondary | ICD-10-CM

## 2019-09-21 DIAGNOSIS — M47816 Spondylosis without myelopathy or radiculopathy, lumbar region: Secondary | ICD-10-CM | POA: Diagnosis not present

## 2019-09-21 NOTE — Patient Instructions (Signed)
____________________________________________________________________________________________  Preparing for Procedure with Sedation  Procedure appointments are limited to planned procedures: . No Prescription Refills. . No disability issues will be discussed. . No medication changes will be discussed.  Instructions: . Oral Intake: Do not eat or drink anything for at least 8 hours prior to your procedure. . Transportation: Public transportation is not allowed. Bring an adult driver. The driver must be physically present in our waiting room before any procedure can be started. . Physical Assistance: Bring an adult physically capable of assisting you, in the event you need help. This adult should keep you company at home for at least 6 hours after the procedure. . Blood Pressure Medicine: Take your blood pressure medicine with a sip of water the morning of the procedure. . Blood thinners: Notify our staff if you are taking any blood thinners. Depending on which one you take, there will be specific instructions on how and when to stop it. . Diabetics on insulin: Notify the staff so that you can be scheduled 1st case in the morning. If your diabetes requires high dose insulin, take only  of your normal insulin dose the morning of the procedure and notify the staff that you have done so. . Preventing infections: Shower with an antibacterial soap the morning of your procedure. . Build-up your immune system: Take 1000 mg of Vitamin C with every meal (3 times a day) the day prior to your procedure. . Antibiotics: Inform the staff if you have a condition or reason that requires you to take antibiotics before dental procedures. . Pregnancy: If you are pregnant, call and cancel the procedure. . Sickness: If you have a cold, fever, or any active infections, call and cancel the procedure. . Arrival: You must be in the facility at least 30 minutes prior to your scheduled procedure. . Children: Do not bring  children with you. . Dress appropriately: Bring dark clothing that you would not mind if they get stained. . Valuables: Do not bring any jewelry or valuables.  Reasons to call and reschedule or cancel your procedure: (Following these recommendations will minimize the risk of a serious complication.) . Surgeries: Avoid having procedures within 2 weeks of any surgery. (Avoid for 2 weeks before or after any surgery). . Flu Shots: Avoid having procedures within 2 weeks of a flu shots or . (Avoid for 2 weeks before or after immunizations). . Barium: Avoid having a procedure within 7-10 days after having had a radiological study involving the use of radiological contrast. (Myelograms, Barium swallow or enema study). . Heart attacks: Avoid any elective procedures or surgeries for the initial 6 months after a "Myocardial Infarction" (Heart Attack). . Blood thinners: It is imperative that you stop these medications before procedures. Let us know if you if you take any blood thinner.  . Infection: Avoid procedures during or within two weeks of an infection (including chest colds or gastrointestinal problems). Symptoms associated with infections include: Localized redness, fever, chills, night sweats or profuse sweating, burning sensation when voiding, cough, congestion, stuffiness, runny nose, sore throat, diarrhea, nausea, vomiting, cold or Flu symptoms, recent or current infections. It is specially important if the infection is over the area that we intend to treat. . Heart and lung problems: Symptoms that may suggest an active cardiopulmonary problem include: cough, chest pain, breathing difficulties or shortness of breath, dizziness, ankle swelling, uncontrolled high or unusually low blood pressure, and/or palpitations. If you are experiencing any of these symptoms, cancel your procedure and contact   your primary care physician for an evaluation.  Remember:  Regular Business hours are:  Monday to Thursday  8:00 AM to 4:00 PM  Provider's Schedule: Milinda Pointer, MD:  Procedure days: Tuesday and Thursday 7:30 AM to 4:00 PM  Gillis Santa, MD:  Procedure days: Monday and Wednesday 7:30 AM to 4:00 PM ____________________________________________________________________________________________   Facet Blocks Patient Information  Description: The facets are joints in the spine between the vertebrae.  Like any joints in the body, facets can become irritated and painful.  Arthritis can also effect the facets.  By injecting steroids and local anesthetic in and around these joints, we can temporarily block the nerve supply to them.  Steroids act directly on irritated nerves and tissues to reduce selling and inflammation which often leads to decreased pain.  Facet blocks may be done anywhere along the spine from the neck to the low back depending upon the location of your pain.   After numbing the skin with local anesthetic (like Novocaine), a small needle is passed onto the facet joints under x-ray guidance.  You may experience a sensation of pressure while this is being done.  The entire block usually lasts about 15-25 minutes.   Conditions which may be treated by facet blocks:   Low back/buttock pain  Neck/shoulder pain  Certain types of headaches  Preparation for the injection:  1. Do not eat any solid food or dairy products within 8 hours of your appointment. 2. You may drink clear liquid up to 3 hours before appointment.  Clear liquids include water, black coffee, juice or soda.  No milk or cream please. 3. You may take your regular medication, including pain medications, with a sip of water before your appointment.  Diabetics should hold regular insulin (if taken separately) and take 1/2 normal NPH dose the morning of the procedure.  Carry some sugar containing items with you to your appointment. 4. A driver must accompany you and be prepared to drive you home after your  procedure. 5. Bring all your current medications with you. 6. An IV may be inserted and sedation may be given at the discretion of the physician. 7. A blood pressure cuff, EKG and other monitors will often be applied during the procedure.  Some patients may need to have extra oxygen administered for a short period. 8. You will be asked to provide medical information, including your allergies and medications, prior to the procedure.  We must know immediately if you are taking blood thinners (like Coumadin/Warfarin) or if you are allergic to IV iodine contrast (dye).  We must know if you could possible be pregnant.  Possible side-effects:   Bleeding from needle site  Infection (rare, may require surgery)  Nerve injury (rare)  Numbness & tingling (temporary)  Difficulty urinating (rare, temporary)  Spinal headache (a headache worse with upright posture)  Light-headedness (temporary)  Pain at injection site (serveral days)  Decreased blood pressure (rare, temporary)  Weakness in arm/leg (temporary)  Pressure sensation in back/neck (temporary)   Call if you experience:   Fever/chills associated with headache or increased back/neck pain  Headache worsened by an upright position  New onset, weakness or numbness of an extremity below the injection site  Hives or difficulty breathing (go to the emergency room)  Inflammation or drainage at the injection site(s)  Severe back/neck pain greater than usual  New symptoms which are concerning to you  Please note:  Although the local anesthetic injected can often make your back or neck  feel good for several hours after the injection, the pain will likely return. It takes 3-7 days for steroids to work.  You may not notice any pain relief for at least one week.  If effective, we will often do a series of 2-3 injections spaced 3-6 weeks apart to maximally decrease your pain.  After the initial series, you may be a candidate for a  more permanent nerve block of the facets.  If you have any questions, please call #[phone]) [facility]

## 2019-09-26 ENCOUNTER — Other Ambulatory Visit: Payer: Self-pay | Admitting: Internal Medicine

## 2019-09-28 ENCOUNTER — Ambulatory Visit (HOSPITAL_BASED_OUTPATIENT_CLINIC_OR_DEPARTMENT_OTHER): Payer: Medicare Other | Admitting: Pain Medicine

## 2019-09-28 ENCOUNTER — Other Ambulatory Visit: Payer: Self-pay

## 2019-09-28 ENCOUNTER — Encounter: Payer: Self-pay | Admitting: Pain Medicine

## 2019-09-28 ENCOUNTER — Ambulatory Visit
Admission: RE | Admit: 2019-09-28 | Discharge: 2019-09-28 | Disposition: A | Discharge status: Home / Self Care | Payer: Medicare Other | Source: Ambulatory Visit | Attending: Pain Medicine | Admitting: Pain Medicine

## 2019-09-28 VITALS — BP 131/72 | HR 69 | Temp 97.3°F | Resp 17 | Ht 66.0 in | Wt 143.0 lb

## 2019-09-28 DIAGNOSIS — M545 Low back pain, unspecified: Secondary | ICD-10-CM

## 2019-09-28 DIAGNOSIS — M25551 Pain in right hip: Secondary | ICD-10-CM | POA: Insufficient documentation

## 2019-09-28 DIAGNOSIS — M25552 Pain in left hip: Secondary | ICD-10-CM | POA: Diagnosis not present

## 2019-09-28 DIAGNOSIS — M4310 Spondylolisthesis, site unspecified: Secondary | ICD-10-CM | POA: Insufficient documentation

## 2019-09-28 DIAGNOSIS — M51379 Other intervertebral disc degeneration, lumbosacral region without mention of lumbar back pain or lower extremity pain: Secondary | ICD-10-CM

## 2019-09-28 DIAGNOSIS — M5137 Other intervertebral disc degeneration, lumbosacral region: Secondary | ICD-10-CM | POA: Insufficient documentation

## 2019-09-28 DIAGNOSIS — G8929 Other chronic pain: Secondary | ICD-10-CM | POA: Insufficient documentation

## 2019-09-28 DIAGNOSIS — M47816 Spondylosis without myelopathy or radiculopathy, lumbar region: Secondary | ICD-10-CM | POA: Insufficient documentation

## 2019-09-28 DIAGNOSIS — M47817 Spondylosis without myelopathy or radiculopathy, lumbosacral region: Secondary | ICD-10-CM

## 2019-09-28 MED ORDER — LACTATED RINGERS IV SOLN
1000.0000 mL | Freq: Once | INTRAVENOUS | Status: AC
  Administered 2019-09-28: 09:00:00 1000 mL via INTRAVENOUS

## 2019-09-28 MED ORDER — FENTANYL CITRATE (PF) 100 MCG/2ML IJ SOLN
25.0000 ug | INTRAMUSCULAR | Status: DC | PRN
  Administered 2019-09-28: 50 ug via INTRAVENOUS
  Filled 2019-09-28: qty 2

## 2019-09-28 MED ORDER — ROPIVACAINE HCL 2 MG/ML IJ SOLN
INTRAMUSCULAR | Status: AC
  Filled 2019-09-28: qty 20

## 2019-09-28 MED ORDER — TRIAMCINOLONE ACETONIDE 40 MG/ML IJ SUSP
INTRAMUSCULAR | Status: AC
  Filled 2019-09-28: qty 2

## 2019-09-28 MED ORDER — MIDAZOLAM HCL 5 MG/5ML IJ SOLN
1.0000 mg | INTRAMUSCULAR | Status: DC | PRN
  Administered 2019-09-28: 1 mg via INTRAVENOUS
  Filled 2019-09-28: qty 5

## 2019-09-28 MED ORDER — LIDOCAINE HCL 2 % IJ SOLN
INTRAMUSCULAR | Status: AC
  Filled 2019-09-28: qty 20

## 2019-09-28 MED ORDER — LIDOCAINE HCL 2 % IJ SOLN
20.0000 mL | Freq: Once | INTRAMUSCULAR | Status: AC
  Administered 2019-09-28: 400 mg

## 2019-09-28 MED ORDER — ROPIVACAINE HCL 2 MG/ML IJ SOLN
18.0000 mL | Freq: Once | INTRAMUSCULAR | Status: AC
  Administered 2019-09-28: 18 mL via PERINEURAL

## 2019-09-28 MED ORDER — TRIAMCINOLONE ACETONIDE 40 MG/ML IJ SUSP
80.0000 mg | Freq: Once | INTRAMUSCULAR | Status: AC
  Administered 2019-09-28: 80 mg

## 2019-09-28 NOTE — Progress Notes (Signed)
Safety precautions to be maintained throughout the outpatient stay will include: orient to surroundings, keep bed in low position, maintain call bell within reach at all times, provide assistance with transfer out of bed and ambulation.  

## 2019-09-28 NOTE — Patient Instructions (Addendum)
____________________________________________________________________________________________  Post-Procedure Discharge Instructions  Instructions:  Apply ice:   Purpose: This will minimize any swelling and discomfort after procedure.   When: Day of procedure, as soon as you get home.  How: Fill a plastic sandwich bag with crushed ice. Cover it with a small towel and apply to injection site.  How long: (15 min on, 15 min off) Apply for 15 minutes then remove x 15 minutes.  Repeat sequence on day of procedure, until you go to bed.  Apply heat:   Purpose: To treat any soreness and discomfort from the procedure.  When: Starting the next day after the procedure.  How: Apply heat to procedure site starting the day following the procedure.  How long: May continue to repeat daily, until discomfort goes away.  Food intake: Start with clear liquids (like water) and advance to regular food, as tolerated.   Physical activities: Keep activities to a minimum for the first 8 hours after the procedure. After that, then as tolerated.  Driving: If you have received any sedation, be responsible and do not drive. You are not allowed to drive for 24 hours after having sedation.  Blood thinner: (Applies only to those taking blood thinners) You may restart your blood thinner 6 hours after your procedure.  Insulin: (Applies only to Diabetic patients taking insulin) As soon as you can eat, you may resume your normal dosing schedule.  Infection prevention: Keep procedure site clean and dry. Shower daily and clean area with soap and water.  Post-procedure Pain Diary: Extremely important that this be done correctly and accurately. Recorded information will be used to determine the next step in treatment. For the purpose of accuracy, follow these rules:  Evaluate only the area treated. Do not report or include pain from an untreated area. For the purpose of this evaluation, ignore all other areas of pain,  except for the treated area.  After your procedure, avoid taking a long nap and attempting to complete the pain diary after you wake up. Instead, set your alarm clock to go off every hour, on the hour, for the initial 8 hours after the procedure. Document the duration of the numbing medicine, and the relief you are getting from it.  Do not go to sleep and attempt to complete it later. It will not be accurate. If you received sedation, it is likely that you were given a medication that may cause amnesia. Because of this, completing the diary at a later time may cause the information to be inaccurate. This information is needed to plan your care.  Follow-up appointment: Keep your post-procedure follow-up evaluation appointment after the procedure (usually 2 weeks for most procedures, 6 weeks for radiofrequencies). DO NOT FORGET to bring you pain diary with you.   Expect: (What should I expect to see with my procedure?)  From numbing medicine (AKA: Local Anesthetics): Numbness or decrease in pain. You may also experience some weakness, which if present, could last for the duration of the local anesthetic.  Onset: Full effect within 15 minutes of injected.  Duration: It will depend on the type of local anesthetic used. On the average, 1 to 8 hours.   From steroids (Applies only if steroids were used): Decrease in swelling or inflammation. Once inflammation is improved, relief of the pain will follow.  Onset of benefits: Depends on the amount of swelling present. The more swelling, the longer it will take for the benefits to be seen. In some cases, up to 10 days.    Duration: Steroids will stay in the system x 2 weeks. Duration of benefits will depend on multiple posibilities including persistent irritating factors.  Side-effects: If present, they may typically last 2 weeks (the duration of the steroids).  Frequent: Cramps (if they occur, drink Gatorade and take over-the-counter Magnesium 450-500 mg  once to twice a day); water retention with temporary weight gain; increases in blood sugar; decreased immune system response; increased appetite.  Occasional: Facial flushing (red, warm cheeks); mood swings; menstrual changes.  Uncommon: Long-term decrease or suppression of natural hormones; bone thinning. (These are more common with higher doses or more frequent use. This is why we prefer that our patients avoid having any injection therapies in other practices.)   Very Rare: Severe mood changes; psychosis; aseptic necrosis.  From procedure: Some discomfort is to be expected once the numbing medicine wears off. This should be minimal if ice and heat are applied as instructed.  Call if: (When should I call?)  You experience numbness and weakness that gets worse with time, as opposed to wearing off.  New onset bowel or bladder incontinence. (Applies only to procedures done in the spine)  Emergency Numbers:  Durning business hours (Monday - Thursday, 8:00 AM - 4:00 PM) (Friday, 9:00 AM - 12:00 Noon): [phone]  After hours: [phone]  NOTE: If you are having a problem and are unable connect with, or to talk to a provider, then go to your nearest urgent care or emergency department. If the problem is serious and urgent, please call 911. ____________________________________________________________________________________________   Facet Joint Block The facet joints connect the bones of the spine (vertebrae). They make it possible for you to bend, twist, and make other movements with your spine. They also keep you from bending too far, twisting too far, and making other extreme movements. A facet joint block is a procedure in which a numbing medicine (anesthetic) is injected into a facet joint. In many cases, an anti-inflammatory medicine (steroid) is also injected. A facet joint block may be done:  To diagnose neck or back pain. If the pain gets better after a facet joint block,  it means the pain is probably coming from the facet joint. If the pain does not get better, it means the pain is probably not coming from the facet joint.  To relieve neck or back pain that is caused by an inflamed facet joint. A facet joint block is only done to relieve pain if the pain does not improve with other methods, such as medicine, exercise programs, and physical therapy. Tell a health care provider about:  Any allergies you have.  All medicines you are taking, including vitamins, herbs, eye drops, creams, and over-the-counter medicines.  Any problems you or family members have had with anesthetic medicines.  Any blood disorders you have.  Any surgeries you have had.  Any medical conditions you have or have had.  Whether you are pregnant or may be pregnant. What are the risks? Generally, this is a safe procedure. However, problems may occur, including:  Bleeding.  Injury to a nerve near the injection site.  Pain at the injection site.  Weakness or numbness in areas controlled by nerves near the injection site.  Infection.  Temporary fluid retention.  Allergic reactions to medicines or dyes.  Injury to other structures or organs near the injection site. What happens before the procedure? Medicines Ask your health care provider about:  Changing or stopping your regular medicines. This is especially important if you   are taking diabetes medicines or blood thinners.  Taking medicines such as aspirin and ibuprofen. These medicines can thin your blood. Do not take these medicines unless your health care provider tells you to take them.  Taking over-the-counter medicines, vitamins, herbs, and supplements. Eating and drinking Follow instructions from your health care provider about eating and drinking, which may include:  8 hours before the procedure - stop eating heavy meals or foods, such as meat, fried foods, or fatty foods.  6 hours before the procedure - stop  eating light meals or foods, such as toast or cereal.  6 hours before the procedure - stop drinking milk or drinks that contain milk.  2 hours before the procedure - stop drinking clear liquids. Staying hydrated Follow instructions from your health care provider about hydration, which may include:  Up to 2 hours before the procedure - you may continue to drink clear liquids, such as water, clear fruit juice, black coffee, and plain tea. General instructions  Do not use any products that contain nicotine or tobacco for at least 4-6 weeks before the procedure. These products include cigarettes, e-cigarettes, and chewing tobacco. If you need help quitting, ask your health care provider.  Plan to have someone take you home from the hospital or clinic.  Ask your health care provider: ? How your surgery site will be marked. ? What steps will be taken to help prevent infection. These may include:  Removing hair at the surgery site.  Washing skin with a germ-killing soap.  Receiving antibiotic medicine. What happens during the procedure?   You will put on a hospital gown.  You will lie on your stomach on an X-ray table. You may be asked to lie in a different position if an injection will be made in your neck.  Machines will be used to monitor your oxygen levels, heart rate, and blood pressure.  Your skin will be cleaned.  If an injection will be made in your neck, an IV will be inserted into one of your veins. Fluids and medicine will flow directly into your body through the IV.  A numbing medicine (local anesthetic) will be applied to your skin. Your skin may sting or burn for a moment.  A video X-ray machine (fluoroscopy) will be used to find the joint. In some cases, a CT scan may be used.  A contrast dye may be injected into the facet joint area to help find the joint.  When the joint is located, an anesthetic will be injected into the joint through the needle.  Your health  care provider will ask you whether you feel pain relief. ? If you feel relief, a steroid may be injected to provide pain relief for a longer period of time. ? If you do not feel relief or feel only partial relief, additional injections of an anesthetic may be made in other facet joints.  The needle will be removed.  Your skin will be cleaned.  A bandage (dressing) will be applied over each injection site. The procedure may vary among health care providers and hospitals. What happens after the procedure?  Your blood pressure, heart rate, breathing rate, and blood oxygen level will be monitored until you leave the hospital or clinic.  You will lie down and rest for a period of time. Summary  A facet joint block is a procedure in which a numbing medicine (anesthetic) is injected into a facet joint. An anti-inflammatory medicine (stereoid) may also be injected.  Follow   instructions from your health care provider about medicines and eating and drinking before the procedure.  Do not use any products that contain nicotine or tobacco for at least 4-6 weeks before the procedure.  You will lie on your stomach for the procedure, but you may be asked to lie in a different position if an injection will be made in your neck.  When the joint is located, an anesthetic will be injected into the joint through the needle. This information is not intended to replace advice given to you by your health care provider. Make sure you discuss any questions you have with your health care provider. Document Revised: 12/24/2018 Document Reviewed: 08/07/2018 [employer] Patient Education  2020 [employer]  

## 2019-09-28 NOTE — Progress Notes (Signed)
PROVIDER NOTE: Information contained herein reflects review and annotations entered in association with encounter. Interpretation of such information and data should be left to medically-trained personnel. Information provided to patient can be located elsewhere in the medical record under "Patient Instructions". Document created using STT-dictation technology, any transcriptional errors that may result from process are unintentional.   Patient's Name: Valerie Raymond  MRN: [id number]  Referring Provider: [name], MD  DOB: 08-Mar-1951  PCP: [name], MD  DOS: 09/28/2019  Note by: Gaspar Cola, MD  Service setting: Ambulatory outpatient  Specialty: Interventional Pain Management  Patient type: Established  Location: [facility] (AMB) Pain Management Facility  Visit type: Interventional Procedure   Primary Reason for Visit: Interventional Pain Management Treatment. CC: Back Pain (left, lower)  Procedure:          Anesthesia, Analgesia, Anxiolysis:  Type: Lumbar Facet, Medial Branch Block(s) #1  Primary Purpose: Diagnostic Region: Posterolateral Lumbosacral Spine Level: L2, L3, L4, L5, & S1 Medial Branch Level(s). Injecting these levels blocks the L3-4, L4-5, and L5-S1 lumbar facet joints. Laterality: Bilateral  Type: Moderate (Conscious) Sedation combined with Local Anesthesia Indication(s): Analgesia and Anxiety Route: Intravenous (IV) IV Access: Secured Sedation: Meaningful verbal contact was maintained at all times during the procedure  Local Anesthetic: Lidocaine 1-2%  Position: Prone   Indications: 1. Lumbar facet syndrome (Bilateral) (L>R)   2. Spondylosis without myelopathy or radiculopathy, lumbosacral region   3. Lumbar facet arthropathy (Multilevel) (Bilateral)   4. Grade 1 Lumbar Retrolisthesis L3/L4   5. DDD (degenerative disc disease), lumbosacral   6. Chronic low back pain (Secondary area of Pain) (Bilateral) (L>R) w/o sciatica   7. Chronic hip pain  (Bilateral) (L>R)    Pain Score: Pre-procedure: 1 /10 Post-procedure: 0-No pain/10   Pre-op Assessment:  Valerie Raymond is a 69 y.o. (year old), female patient, seen today for interventional treatment. She  has a past surgical history that includes Colonoscopy (2006); Colonoscopy (N/A, 01/23/2015); Abdominal hysterectomy (1992); Salpingoophorectomy (Bilateral); Esophagogastroduodenoscopy (N/A, 04/21/2017); Esophageal dilation (04/21/2017); Esophagogastroduodenoscopy (egd) with propofol (N/A, 07/07/2017); Esophageal dilation (07/07/2017); Esophagogastroduodenoscopy (egd) with propofol (N/A, 07/28/2017); and Esophageal dilation (N/A, 07/28/2017). Valerie Raymond has a current medication list which includes the following prescription(s): alendronate, artificial tears, calcium carbonate-vit d-min, vitamin d3, premarin, meloxicam, and pantoprazole, and the following Facility-Administered Medications: fentanyl and midazolam. Her primarily concern today is the Back Pain (left, lower)  Initial Vital Signs:  Pulse/HCG Rate: 69ECG Heart Rate: 73 Temp: (!) 97.3 F (36.3 C) Resp: 16 BP: (!) 143/91 SpO2: 99 %  BMI: Estimated body mass index is 23.08 kg/m as calculated from the following:   Height as of this encounter: 5\' 6"  (1.676 m).   Weight as of this encounter: 143 lb (64.9 kg).  Risk Assessment: Allergies: Reviewed. She is allergic to lodine [etodolac].  Allergy Precautions: None required Coagulopathies: Reviewed. None identified.  Blood-thinner therapy: None at this time Active Infection(s): Reviewed. None identified. Valerie Raymond is afebrile  Site Confirmation: Valerie Raymond was asked to confirm the procedure and laterality before marking the site Procedure checklist: Completed Consent: Before the procedure and under the influence of no sedative(s), amnesic(s), or anxiolytics, the patient was informed of the treatment options, risks and possible complications. To fulfill our ethical and legal obligations, as  recommended by the American Medical Association's Code of Ethics, I have informed the patient of my clinical impression; the nature and purpose of the treatment or procedure; the risks, benefits, and possible complications of the intervention; the  alternatives, including doing nothing; the risk(s) and benefit(s) of the alternative treatment(s) or procedure(s); and the risk(s) and benefit(s) of doing nothing. The patient was provided information about the general risks and possible complications associated with the procedure. These may include, but are not limited to: failure to achieve desired goals, infection, bleeding, organ or nerve damage, allergic reactions, paralysis, and death. In addition, the patient was informed of those risks and complications associated to Spine-related procedures, such as failure to decrease pain; infection (i.e.: Meningitis, epidural or intraspinal abscess); bleeding (i.e.: epidural hematoma, subarachnoid hemorrhage, or any other type of intraspinal or peri-dural bleeding); organ or nerve damage (i.e.: Any type of peripheral nerve, nerve root, or spinal cord injury) with subsequent damage to sensory, motor, and/or autonomic systems, resulting in permanent pain, numbness, and/or weakness of one or several areas of the body; allergic reactions; (i.e.: anaphylactic reaction); and/or death. Furthermore, the patient was informed of those risks and complications associated with the medications. These include, but are not limited to: allergic reactions (i.e.: anaphylactic or anaphylactoid reaction(s)); adrenal axis suppression; blood sugar elevation that in diabetics may result in ketoacidosis or comma; water retention that in patients with history of congestive heart failure may result in shortness of breath, pulmonary edema, and decompensation with resultant heart failure; weight gain; swelling or edema; medication-induced neural toxicity; particulate matter embolism and blood vessel  occlusion with resultant organ, and/or nervous system infarction; and/or aseptic necrosis of one or more joints. Finally, the patient was informed that Medicine is not an exact science; therefore, there is also the possibility of unforeseen or unpredictable risks and/or possible complications that may result in a catastrophic outcome. The patient indicated having understood very clearly. We have given the patient no guarantees and we have made no promises. Enough time was given to the patient to ask questions, all of which were answered to the patient's satisfaction. Ms. Cull has indicated that she wanted to continue with the procedure. Attestation: I, the ordering provider, attest that I have discussed with the patient the benefits, risks, side-effects, alternatives, likelihood of achieving goals, and potential problems during recovery for the procedure that I have provided informed consent. Date   Time: 09/28/2019  8:05 AM  Pre-Procedure Preparation:  Monitoring: As per clinic protocol. Respiration, ETCO2, SpO2, BP, heart rate and rhythm monitor placed and checked for adequate function Safety Precautions: Patient was assessed for positional comfort and pressure points before starting the procedure. Time-out: I initiated and conducted the "Time-out" before starting the procedure, as per protocol. The patient was asked to participate by confirming the accuracy of the "Time Out" information. Verification of the correct person, site, and procedure were performed and confirmed by me, the nursing staff, and the patient. "Time-out" conducted as per Joint Commission's Universal Protocol (UP.01.01.01). Time: [id number]  Description of Procedure:          Laterality: Bilateral. The procedure was performed in identical fashion on both sides. Levels:  L2, L3, L4, L5, & S1 Medial Branch Level(s) Area Prepped: Posterior Lumbosacral Region Prepping solution: DuraPrep (Iodine Povacrylex [0.7% available iodine] and  Isopropyl Alcohol, 74% w/w) Safety Precautions: Aspiration looking for blood return was conducted prior to all injections. At no point did we inject any substances, as a needle was being advanced. Before injecting, the patient was told to immediately notify me if she was experiencing any new onset of "ringing in the ears, or metallic taste in the mouth". No attempts were made at seeking any paresthesias. Safe injection practices and  needle disposal techniques used. Medications properly checked for expiration dates. SDV (single dose vial) medications used. After the completion of the procedure, all disposable equipment used was discarded in the proper designated medical waste containers. Local Anesthesia: Protocol guidelines were followed. The patient was positioned over the fluoroscopy table. The area was prepped in the usual manner. The time-out was completed. The target area was identified using fluoroscopy. A 12-in long, straight, sterile hemostat was used with fluoroscopic guidance to locate the targets for each level blocked. Once located, the skin was marked with an approved surgical skin marker. Once all sites were marked, the skin (epidermis, dermis, and hypodermis), as well as deeper tissues (fat, connective tissue and muscle) were infiltrated with a small amount of a short-acting local anesthetic, loaded on a 10cc syringe with a 25G, 1.5-in  Needle. An appropriate amount of time was allowed for local anesthetics to take effect before proceeding to the next step. Local Anesthetic: Lidocaine 2.0% The unused portion of the local anesthetic was discarded in the proper designated containers. Technical explanation of process:  L2 Medial Branch Nerve Block (MBB): The target area for the L2 medial branch is at the junction of the postero-lateral aspect of the superior articular process and the superior, posterior, and medial edge of the transverse process of L3. Under fluoroscopic guidance, a Quincke needle  was inserted until contact was made with os over the superior postero-lateral aspect of the pedicular shadow (target area). After negative aspiration for blood, 0.5 mL of the nerve block solution was injected without difficulty or complication. The needle was removed intact. L3 Medial Branch Nerve Block (MBB): The target area for the L3 medial branch is at the junction of the postero-lateral aspect of the superior articular process and the superior, posterior, and medial edge of the transverse process of L4. Under fluoroscopic guidance, a Quincke needle was inserted until contact was made with os over the superior postero-lateral aspect of the pedicular shadow (target area). After negative aspiration for blood, 0.5 mL of the nerve block solution was injected without difficulty or complication. The needle was removed intact. L4 Medial Branch Nerve Block (MBB): The target area for the L4 medial branch is at the junction of the postero-lateral aspect of the superior articular process and the superior, posterior, and medial edge of the transverse process of L5. Under fluoroscopic guidance, a Quincke needle was inserted until contact was made with os over the superior postero-lateral aspect of the pedicular shadow (target area). After negative aspiration for blood, 0.5 mL of the nerve block solution was injected without difficulty or complication. The needle was removed intact. L5 Medial Branch Nerve Block (MBB): The target area for the L5 medial branch is at the junction of the postero-lateral aspect of the superior articular process and the superior, posterior, and medial edge of the sacral ala. Under fluoroscopic guidance, a Quincke needle was inserted until contact was made with os over the superior postero-lateral aspect of the pedicular shadow (target area). After negative aspiration for blood, 0.5 mL of the nerve block solution was injected without difficulty or complication. The needle was removed intact. S1  Medial Branch Nerve Block (MBB): The target area for the S1 medial branch is at the posterior and inferior 6 o'clock position of the L5-S1 facet joint. Under fluoroscopic guidance, the Quincke needle inserted for the L5 MBB was redirected until contact was made with os over the inferior and postero aspect of the sacrum, at the 6 o' clock position under the  L5-S1 facet joint (Target area). After negative aspiration for blood, 0.5 mL of the nerve block solution was injected without difficulty or complication. The needle was removed intact.  Nerve block solution: 0.2% PF-Ropivacaine + Triamcinolone (40 mg/mL) diluted to a final concentration of 4 mg of Triamcinolone/mL of Ropivacaine The unused portion of the solution was discarded in the proper designated containers. Procedural Needles: 22-gauge, 3.5-inch, Quincke needles used for all levels.  Once the entire procedure was completed, the treated area was cleaned, making sure to leave some of the prepping solution back to take advantage of its long term bactericidal properties.   Illustration of the posterior view of the lumbar spine and the posterior neural structures. Laminae of L2 through S1 are labeled. DPRL5, dorsal primary ramus of L5; DPRS1, dorsal primary ramus of S1; DPR3, dorsal primary ramus of L3; FJ, facet (zygapophyseal) joint L3-L4; I, inferior articular process of L4; LB1, lateral branch of dorsal primary ramus of L1; IAB, inferior articular branches from L3 medial branch (supplies L4-L5 facet joint); IBP, intermediate branch plexus; MB3, medial branch of dorsal primary ramus of L3; NR3, third lumbar nerve root; S, superior articular process of L5; SAB, superior articular branches from L4 (supplies L4-5 facet joint also); TP3, transverse process of L3.  Vitals:   09/28/19 0847 09/28/19 0852 09/28/19 0902 09/28/19 0911  BP: 132/79 132/71 126/73 131/72  Pulse:      Resp: 16 17 16 17   Temp:      TempSrc:      SpO2: 100% 100% 98% 99%    Weight:      Height:         Start Time: 0832 hrs. End Time: 0846 hrs.  Imaging Guidance (Spinal):          Type of Imaging Technique: Fluoroscopy Guidance (Spinal) Indication(s): Assistance in needle guidance and placement for procedures requiring needle placement in or near specific anatomical locations not easily accessible without such assistance. Exposure Time: Please see nurses notes. Contrast: None used. Fluoroscopic Guidance: I was personally present during the use of fluoroscopy. "Tunnel Vision Technique" used to obtain the best possible view of the target area. Parallax error corrected before commencing the procedure. "Direction-depth-direction" technique used to introduce the needle under continuous pulsed fluoroscopy. Once target was reached, antero-posterior, oblique, and lateral fluoroscopic projection used confirm needle placement in all planes. Images permanently stored in EMR. Interpretation: No contrast injected. I personally interpreted the imaging intraoperatively. Adequate needle placement confirmed in multiple planes. Permanent images saved into the patient's record.  Antibiotic Prophylaxis:   Anti-infectives (From admission, onward)   None     Indication(s): None identified  Post-operative Assessment:  Post-procedure Vital Signs:  Pulse/HCG Rate: 6976 Temp: (!) 97.3 F (36.3 C) Resp: 17 BP: 131/72 SpO2: 99 %  EBL: None  Complications: No immediate post-treatment complications observed by team, or reported by patient.  Note: The patient tolerated the entire procedure well. A repeat set of vitals were taken after the procedure and the patient was kept under observation following institutional policy, for this type of procedure. Post-procedural neurological assessment was performed, showing return to baseline, prior to discharge. The patient was provided with post-procedure discharge instructions, including a section on how to identify potential problems.  Should any problems arise concerning this procedure, the patient was given instructions to immediately contact us, at any time, without hesitation. In any case, we plan to contact the patient by telephone for a follow-up status report regarding this interventional procedure.  Comments:  No additional relevant information.  Plan of Care  Orders:  Orders Placed This Encounter  Procedures   L-FCT Blk (Today)    Scheduling Instructions:     Procedure: Lumbar facet block (AKA.: Lumbosacral medial branch nerve block)     Side: Bilateral     Level: L3-4, L4-5, & L5-S1 Facets (L2, L3, L4, L5, & S1 Medial Branch Nerves)     Sedation: Patient's choice.     Timeframe: Today    Order Specific Question:   Where will this procedure be performed?    Answer:   [facility]   Fluoro (C-Arm) (<60 min) (No Report)    Intraoperative interpretation by procedural physician at [facility].    Standing Status:   Standing    Number of Occurrences:   1    Order Specific Question:   Reason for exam:    Answer:   Assistance in needle guidance and placement for procedures requiring needle placement in or near specific anatomical locations not easily accessible without such assistance.   Consent: L-FCT BLK    Nursing Order: Transcribe to consent form and obtain patient signature. Note: Always confirm laterality of pain with Ms. Komar, before procedure. Procedure: Lumbar Facet Block  under fluoroscopic guidance Indication/Reason: Low Back Pain, with our without leg pain, due to Facet Joint Arthralgia (Joint Pain) known as Lumbar Facet Syndrome, secondary to Lumbar, and/or Lumbosacral Spondylosis (Arthritis of the Spine), without myelopathy or radiculopathy (Nerve Damage). Provider Attestation: I, [place] Dossie Arbour, MD, (Pain Management Specialist), the physician/practitioner, attest that I have discussed with the patient the benefits, risks, side effects, alternatives, likelihood of achieving  goals and potential problems during recovery for the procedure that I have provided informed consent.   Block Tray    Equipment required: Single use, disposable, "Block Tray"    Standing Status:   Standing    Number of Occurrences:   1    Order Specific Question:   Specify    Answer:   Block Tray   Chronic Opioid Analgesic:  No opioid analgesics prescribed by our practice.   Medications ordered for procedure: Meds ordered this encounter  Medications   lidocaine (XYLOCAINE) 2 % (with pres) injection 400 mg   lactated ringers infusion 1,000 mL   midazolam (VERSED) 5 MG/5ML injection 1-2 mg    Make sure Flumazenil is available in the pyxis when using this medication. If oversedation occurs, administer 0.2 mg IV over 15 sec. If after 45 sec no response, administer 0.2 mg again over 1 min; may repeat at 1 min intervals; not to exceed 4 doses (1 mg)   fentaNYL (SUBLIMAZE) injection 25-50 mcg    Make sure Narcan is available in the pyxis when using this medication. In the event of respiratory depression (RR< 8/min): Titrate NARCAN (naloxone) in increments of 0.1 to 0.2 mg IV at 2-3 minute intervals, until desired degree of reversal.   ropivacaine (PF) 2 mg/mL (0.2%) (NAROPIN) injection 18 mL   triamcinolone acetonide (KENALOG-40) injection 80 mg   Medications administered: We administered lidocaine, lactated ringers, midazolam, fentaNYL, ropivacaine (PF) 2 mg/mL (0.2%), and triamcinolone acetonide.  See the medical record for exact dosing, route, and time of administration.  Follow-up plan:   Return in about 2 weeks (around 10/12/2019) for (VV), (PP).       Interventional management options: Planned, scheduled, and/or pending:   Diagnostic left intra-articular hip joint injection #1    Considering:   Diagnostic bilateral lumbar facet block  Possible  bilateral vs left lumbar facet RFA    Palliative PRN treatment(s):   Diagnostic left trochanteric bursa injection #2      Recent  Visits Date Type Provider Dept  09/21/19 Telemedicine Milinda Pointer, MD [facility]  09/07/19 Procedure visit Milinda Pointer, MD [facility]  08/17/19 Procedure visit Milinda Pointer, MD [facility]  08/04/19 Office Visit Milinda Pointer, MD [facility]  Showing recent visits within past 90 days and meeting all other requirements   Today's Visits Date Type Provider Dept  09/28/19 Procedure visit Milinda Pointer, MD [facility]  Showing today's visits and meeting all other requirements   Future Appointments Date Type Provider Dept  10/11/19 Appointment Milinda Pointer, MD [facility]  Showing future appointments within next 90 days and meeting all other requirements   Disposition: Discharge home  Discharge (Date   Time): 09/28/2019; 0915 hrs.   Primary Care Physician: [name], MD Location: [facility] Note by: Gaspar Cola, MD Date: 09/28/2019; Time: 10:11 AM  Disclaimer:  Medicine is not an exact science. The only guarantee in medicine is that nothing is guaranteed. It is important to note that the decision to proceed with this intervention was based on the information collected from the patient. The Data and conclusions were drawn from the patient's questionnaire, the interview, and the physical examination. Because the information was provided in large part by the patient, it cannot be guaranteed that it has not been purposely or unconsciously manipulated. Every effort has been made to obtain as much relevant data as possible for this evaluation. It is important to note that the conclusions that lead to this procedure are derived in large part from the available data. Always take into account that the treatment will also be dependent on availability of resources and existing treatment guidelines, considered by other Pain Management Practitioners as being common  knowledge and practice, at the time of the intervention. For Medico-Legal purposes, it is also important to point out that variation in procedural techniques and pharmacological choices are the acceptable norm. The indications, contraindications, technique, and results of the above procedure should only be interpreted and judged by a Board-Certified Interventional Pain Specialist with extensive familiarity and expertise in the same exact procedure and technique.

## 2019-10-06 NOTE — Progress Notes (Signed)
Pain relief after procedure (treated area only): (Questions asked to patient) 1. Starting about 15 minutes after the procedure, and "while the area was still numb" (from the local anesthetics), were you having any of your usual pain "in that area" (the treated area)?  (NOTE: NOT including the discomfort from the needle sticks.) First 1 hour: 100 % better. First 4-6 hours: 90 % better. 2. How long did the numbness from the local anesthetics last? (More than 6 hours?) Duration: numbness lasted 2 - 3 hours hours.  3. How much better is your pain now, when compared to before the procedure? Current benefit: 50 % better. 4. Can you move better now? Improvement in ROM (Range of Motion): Yes. 5. Can you do more now? Improvement in function: Yes. 4. Did you have any problems with the procedure? Side-effects/Complications: No.

## 2019-10-07 NOTE — Progress Notes (Signed)
Patient: Valerie Raymond  Service Category: E/M  Provider: Gaspar Cola, MD  DOB: 1951-02-01  DOS: 10/11/2019  Location: Office  MRN: [id number]  Setting: Ambulatory outpatient  Referring Provider: [name], MD  Type: Established Patient  Specialty: Interventional Pain Management  PCP: [name], MD  Location: Remote location  Delivery: TeleHealth     Virtual Encounter - Pain Management PROVIDER NOTE: Information contained herein reflects review and annotations entered in association with encounter. Interpretation of such information and data should be left to medically-trained personnel. Information provided to patient can be located elsewhere in the medical record under "Patient Instructions". Document created using STT-dictation technology, any transcriptional errors that may result from process are unintentional.    Contact & Pharmacy Preferred: [phone] Home: [phone] (home) Mobile: [phone] (mobile) E-mail: [email or web address]  [facility] [place], [address] [place] [address] Phone: [phone] Fax: [phone]   Pre-screening  Valerie Raymond offered "in-person" vs "virtual" encounter. She indicated preferring virtual for this encounter.   Reason COVID-19*  Social distancing based on CDC and AMA recommendations.   I contacted Valerie Raymond on 10/11/2019 via telephone.      I clearly identified myself as Gaspar Cola, MD. I verified that I was speaking with the correct person using two identifiers (Name: Valerie Raymond, and date of birth: [birth year]).  Consent I sought verbal advanced consent from Valerie Raymond for virtual visit interactions. I informed Valerie Raymond of possible security and privacy concerns, risks, and limitations associated with providing "not-in-person" medical evaluation and management services. I also informed Valerie Raymond of the availability of  "in-person" appointments. Finally, I informed her that there would be a charge for the virtual visit and that she could be  personally, fully or partially, financially responsible for it. Valerie Raymond expressed understanding and agreed to proceed.   Historic Elements   Valerie Raymond is a 69 y.o. year old, female patient evaluated today after her last encounter by our practice on 09/28/2019. Valerie Raymond  has a past medical history of Arthralgia of hip (07/23/2015), Arthritis, Degenerative disc disease, lumbar, DJD (degenerative joint disease), Dysphagia, Endometriosis, GERD (gastroesophageal reflux disease), Heartburn, Joint pain, Osteoporosis, Pelvic pressure in female (01/02/2016), Rash, and Vitamin D deficiency. She also  has a past surgical history that includes Colonoscopy (2006); Colonoscopy (N/A, 01/23/2015); Abdominal hysterectomy (1992); Salpingoophorectomy (Bilateral); Esophagogastroduodenoscopy (N/A, 04/21/2017); Esophageal dilation (04/21/2017); Esophagogastroduodenoscopy (egd) with propofol (N/A, 07/07/2017); Esophageal dilation (07/07/2017); Esophagogastroduodenoscopy (egd) with propofol (N/A, 07/28/2017); and Esophageal dilation (N/A, 07/28/2017). Valerie Raymond has a current medication list which includes the following prescription(s): alendronate, artificial tears, calcium carbonate-vit d-min, vitamin d3, premarin, meloxicam, and pantoprazole. She  reports that she has never smoked. She has never used smokeless tobacco. She reports that she does not drink alcohol or use drugs. Valerie Raymond is allergic to lodine [etodolac].   HPI  Today, she is being contacted for a post-procedure assessment.  The patient indicates having done well with the first diagnostic lumbar facet block and she is wondering if a second one would help bring the pain further them.  Today I informed the patient that it is entirely possible that that would be the case, but there is only one way to know and that is to go ahead and have the  injection done.  In any case, if we do the second diagnostic injection and she  gets the same type of relief, but no better, she may still be a good candidate for radiofrequency ablation to see if we can extend on that benefit.  All of this was explained to the patient today and she understood and accepted.  Today I answered all of her questions regarding this matter.  Post-Procedure Evaluation  Procedure: Diagnostic bilateral lumbar facet block #1 under fluoroscopic guidance and IV sedation Pre-procedure pain level:  1/10 Post-procedure: 0/10 (100% relief)  Sedation: Sedation provided.  Valerie Raymond, Valerie Raymond  10/11/2019  8:21 AM  Signed Pain relief after procedure (treated area only): (Questions asked to patient) 1. Starting about 15 minutes after the procedure, and "while the area was still numb" (from the local anesthetics), were you having any of your usual pain "in that area" (the treated area)?  (NOTE: NOT including the discomfort from the needle sticks.) First 1 hour: 100 % better. First 4-6 hours: 90% better. 3. How much better is your pain now, when compared to before the procedure? Current benefit: 50 % better. 4. Can you move better now? Improvement in ROM (Range of Motion): Yes. 5. Can you do more now? Improvement in function: Yes. 4. Did you have any problems with the procedure? Side-effects/Complications: No.  Current benefits: Defined as benefit that persist at this time.   Analgesia:  50% improved Function: Valerie Raymond reports improvement in function ROM: Valerie Raymond reports improvement in ROM  Pharmacotherapy Assessment  Analgesic: No opioid analgesics prescribed by our practice.   Monitoring: Pharmacotherapy: No side-effects or adverse reactions reported. [place] PMP: PDMP reviewed during this encounter.       Compliance: No problems identified. Effectiveness: Clinically acceptable. Plan: Refer to "POC".  UDS: No results found for: SUMMARY   Laboratory Chemistry Profile (12  mo)  Renal: 08/04/2019: BUN 10; BUN/Creatinine Ratio 11; Creatinine, Ser 0.87  Lab Results  Component Value Date   GFRAA 79 08/04/2019   GFRNONAA 69 08/04/2019   Hepatic: 08/04/2019: Albumin 4.4 Lab Results  Component Value Date   AST 24 08/04/2019   ALT 20 04/06/2019   Other: 08/04/2019: 25-Hydroxy, Vitamin D 39; 25-Hydroxy, Vitamin D-2 <1.0; 25-Hydroxy, Vitamin D-3 39; CRP 3; Sed Rate 12; Vitamin B-12 426  Note: Above Lab results reviewed.  Imaging  Fluoro (C-Arm) (<60 min) (No Report) Fluoro was used, but no Radiologist interpretation will be provided.  Please refer to "NOTES" tab for provider progress note.   Assessment  The primary encounter diagnosis was Chronic pain syndrome. Diagnoses of Chronic thigh pain (Primary Area of Pain) (Left), Chronic low back pain (Secondary area of Pain) (Bilateral) (L>R) w/o sciatica, Chronic lower extremity pain (Third area of Pain) (Bilateral) (L>R), Lumbar facet syndrome (Bilateral) (L>R), Lumbar facet arthropathy (Multilevel) (Bilateral), and Chronic hip pain (Bilateral) (L>R) were also pertinent to this visit.  Plan of Care  Problem-specific:  No problem-specific Assessment & Plan notes found for this encounter.  I am having Valerie Blase. Mcelhannon "Gaye" maintain her Vitamin D3, Calcium Carbonate-Vit D-Min (CALCIUM 1200 PO), meloxicam, Premarin, pantoprazole, Artificial Tears, and alendronate.  Pharmacotherapy (Medications Ordered): No orders of the defined types were placed in this encounter.  Orders:  Orders Placed This Encounter  Procedures  . LUMBAR FACET(MEDIAL BRANCH NERVE BLOCK) MBNB    Standing Status:   Future    Standing Expiration Date:   11/11/2019    Scheduling Instructions:     Procedure: Lumbar facet block (AKA.: Lumbosacral medial branch nerve block)     Side: Bilateral  Level: L3-4, L4-5, & L5-S1 Facets (L2, L3, L4, L5, & S1 Medial Branch Nerves)     Sedation: Patient's choice.     Timeframe: ASAA    Order Specific  Question:   Where will this procedure be performed?    Answer:   [facility]   Follow-up plan:   Return for Procedure (w/ sedation): (B) L-FCT Blk #2.      Interventional management options: Planned, scheduled, and/or pending:   Diagnostic/therapeutic bilateral lumbar facet block #2    Considering:   Diagnostic bilateral lumbar facet block #2  Possible bilateral vs left lumbar facet RFA    Palliative PRN treatment(s):   Diagnostic/therapeutic bilateral lumbar facet block #2 (100/90/50) Palliative left IA hip joint injection #2 (100/100/0) Palliative left trochanteric bursa injection #2 (100/50/50)    Recent Visits Date Type Provider Dept  09/28/19 Procedure visit Milinda Pointer, MD [facility]  09/21/19 Telemedicine Milinda Pointer, MD [facility]  09/07/19 Procedure visit Milinda Pointer, MD [facility]  08/17/19 Procedure visit Milinda Pointer, MD [facility]  08/04/19 Office Visit Milinda Pointer, MD [facility]  Showing recent visits within past 90 days and meeting all other requirements   Today's Visits Date Type Provider Dept  10/11/19 Telemedicine Milinda Pointer, MD [facility]  Showing today's visits and meeting all other requirements   Future Appointments No visits were found meeting these conditions.  Showing future appointments within next 90 days and meeting all other requirements   I discussed the assessment and treatment plan with the patient. The patient was provided an opportunity to ask questions and all were answered. The patient agreed with the plan and demonstrated an understanding of the instructions.  Patient advised to call back or seek an in-person evaluation if the symptoms or condition worsens.  Duration of encounter: 17 minutes.  Note by: Gaspar Cola, MD Date: 10/11/2019; Time: 11:23 AM

## 2019-10-11 ENCOUNTER — Other Ambulatory Visit: Payer: Self-pay

## 2019-10-11 ENCOUNTER — Ambulatory Visit: Payer: Medicare Other | Attending: Pain Medicine | Admitting: Pain Medicine

## 2019-10-11 DIAGNOSIS — G8929 Other chronic pain: Secondary | ICD-10-CM

## 2019-10-11 DIAGNOSIS — M25551 Pain in right hip: Secondary | ICD-10-CM

## 2019-10-11 DIAGNOSIS — M79604 Pain in right leg: Secondary | ICD-10-CM

## 2019-10-11 DIAGNOSIS — M79605 Pain in left leg: Secondary | ICD-10-CM

## 2019-10-11 DIAGNOSIS — M79652 Pain in left thigh: Secondary | ICD-10-CM

## 2019-10-11 DIAGNOSIS — M47816 Spondylosis without myelopathy or radiculopathy, lumbar region: Secondary | ICD-10-CM

## 2019-10-11 DIAGNOSIS — G894 Chronic pain syndrome: Secondary | ICD-10-CM | POA: Diagnosis not present

## 2019-10-11 DIAGNOSIS — M545 Low back pain, unspecified: Secondary | ICD-10-CM

## 2019-10-11 DIAGNOSIS — M25552 Pain in left hip: Secondary | ICD-10-CM

## 2019-10-11 NOTE — Patient Instructions (Signed)

## 2019-10-11 NOTE — Progress Notes (Signed)
Pain relief after procedure (treated area only): (Questions asked to patient) 1. Starting about 15 minutes after the procedure, and "while the area was still numb" (from the local anesthetics), were you having any of your usual pain "in that area" (the treated area)?  (NOTE: NOT including the discomfort from the needle sticks.) First 1 hour: 100 % better. First 4-6 hours: 90% better. 3. How much better is your pain now, when compared to before the procedure? Current benefit: 50 % better. 4. Can you move better now? Improvement in ROM (Range of Motion): Yes. 5. Can you do more now? Improvement in function: Yes. 4. Did you have any problems with the procedure? Side-effects/Complications: No.

## 2019-10-21 ENCOUNTER — Ambulatory Visit (HOSPITAL_BASED_OUTPATIENT_CLINIC_OR_DEPARTMENT_OTHER): Payer: Medicare Other | Admitting: Pain Medicine

## 2019-10-21 ENCOUNTER — Encounter: Payer: Self-pay | Admitting: Pain Medicine

## 2019-10-21 ENCOUNTER — Other Ambulatory Visit: Payer: Self-pay

## 2019-10-21 ENCOUNTER — Ambulatory Visit
Admission: RE | Admit: 2019-10-21 | Discharge: 2019-10-21 | Disposition: A | Discharge status: Home / Self Care | Payer: Medicare Other | Source: Ambulatory Visit | Attending: Pain Medicine | Admitting: Pain Medicine

## 2019-10-21 VITALS — BP 137/87 | HR 72 | Temp 97.4°F | Resp 20 | Ht 66.0 in | Wt 145.0 lb

## 2019-10-21 DIAGNOSIS — G8929 Other chronic pain: Secondary | ICD-10-CM | POA: Insufficient documentation

## 2019-10-21 DIAGNOSIS — M47816 Spondylosis without myelopathy or radiculopathy, lumbar region: Secondary | ICD-10-CM | POA: Diagnosis not present

## 2019-10-21 DIAGNOSIS — M5137 Other intervertebral disc degeneration, lumbosacral region: Secondary | ICD-10-CM | POA: Insufficient documentation

## 2019-10-21 DIAGNOSIS — M51379 Other intervertebral disc degeneration, lumbosacral region without mention of lumbar back pain or lower extremity pain: Secondary | ICD-10-CM

## 2019-10-21 DIAGNOSIS — M47817 Spondylosis without myelopathy or radiculopathy, lumbosacral region: Secondary | ICD-10-CM | POA: Insufficient documentation

## 2019-10-21 DIAGNOSIS — M545 Low back pain, unspecified: Secondary | ICD-10-CM

## 2019-10-21 MED ORDER — TRIAMCINOLONE ACETONIDE 40 MG/ML IJ SUSP
INTRAMUSCULAR | Status: AC
  Filled 2019-10-21: qty 2

## 2019-10-21 MED ORDER — MIDAZOLAM HCL 5 MG/5ML IJ SOLN
1.0000 mg | INTRAMUSCULAR | Status: DC | PRN
  Administered 2019-10-21: 2 mg via INTRAVENOUS

## 2019-10-21 MED ORDER — MIDAZOLAM HCL 5 MG/5ML IJ SOLN
INTRAMUSCULAR | Status: AC
  Filled 2019-10-21: qty 5

## 2019-10-21 MED ORDER — LIDOCAINE HCL 2 % IJ SOLN
INTRAMUSCULAR | Status: AC
  Filled 2019-10-21: qty 20

## 2019-10-21 MED ORDER — LIDOCAINE HCL 2 % IJ SOLN
20.0000 mL | Freq: Once | INTRAMUSCULAR | Status: AC
  Administered 2019-10-21: 400 mg

## 2019-10-21 MED ORDER — TRIAMCINOLONE ACETONIDE 40 MG/ML IJ SUSP
80.0000 mg | Freq: Once | INTRAMUSCULAR | Status: AC
  Administered 2019-10-21: 80 mg

## 2019-10-21 MED ORDER — ROPIVACAINE HCL 2 MG/ML IJ SOLN
18.0000 mL | Freq: Once | INTRAMUSCULAR | Status: AC
  Administered 2019-10-21: 18 mL via PERINEURAL

## 2019-10-21 MED ORDER — ROPIVACAINE HCL 2 MG/ML IJ SOLN
INTRAMUSCULAR | Status: AC
  Filled 2019-10-21: qty 20

## 2019-10-21 MED ORDER — LACTATED RINGERS IV SOLN
1000.0000 mL | Freq: Once | INTRAVENOUS | Status: AC
  Administered 2019-10-21: 09:00:00 1000 mL via INTRAVENOUS

## 2019-10-21 MED ORDER — FENTANYL CITRATE (PF) 100 MCG/2ML IJ SOLN
25.0000 ug | INTRAMUSCULAR | Status: DC | PRN
  Administered 2019-10-21: 50 ug via INTRAVENOUS

## 2019-10-21 MED ORDER — FENTANYL CITRATE (PF) 100 MCG/2ML IJ SOLN
INTRAMUSCULAR | Status: AC
  Filled 2019-10-21: qty 2

## 2019-10-21 NOTE — Patient Instructions (Signed)

## 2019-10-21 NOTE — Progress Notes (Signed)
PROVIDER NOTE: Information contained herein reflects review and annotations entered in association with encounter. Interpretation of such information and data should be left to medically-trained personnel. Information provided to patient can be located elsewhere in the medical record under "Patient Instructions". Document created using STT-dictation technology, any transcriptional errors that may result from process are unintentional.    Patient: Valerie Raymond  Service Category: Procedure  Provider: Gaspar Cola, MD  DOB: Jan 31, 1951  DOS: 10/21/2019  Location: [facility]  MRN: [id number]  Setting: Ambulatory - outpatient  Referring Provider: [name], MD  Type: Established Patient  Specialty: Interventional Pain Management  PCP: [name], MD   Primary Reason for Visit: Interventional Pain Management Treatment. CC: Back Pain (low)  Procedure:          Anesthesia, Analgesia, Anxiolysis:  Type: Lumbar Facet, Medial Branch Block(s) #2  Primary Purpose: Diagnostic Region: Posterolateral Lumbosacral Spine Level: L2, L3, L4, L5, & S1 Medial Branch Level(s). Injecting these levels blocks the L3-4, L4-5, and L5-S1 lumbar facet joints. Laterality: Bilateral  Type: Moderate (Conscious) Sedation combined with Local Anesthesia Indication(s): Analgesia and Anxiety Route: Intravenous (IV) IV Access: Secured Sedation: Meaningful verbal contact was maintained at all times during the procedure  Local Anesthetic: Lidocaine 1-2%  Position: Prone   Indications: 1. Lumbar facet syndrome (Bilateral) (L>R)   2. Spondylosis without myelopathy or radiculopathy, lumbosacral region   3. Lumbar facet arthropathy (Multilevel) (Bilateral)   4. DDD (degenerative disc disease), lumbosacral   5. Chronic low back pain (Secondary area of Pain) (Bilateral) (L>R) w/o sciatica    Pain Score: Pre-procedure: 1 /10 Post-procedure: 0-No pain/10   Pre-op Assessment:  Valerie Raymond is a  69 y.o. (year old), female patient, seen today for interventional treatment. She  has a past surgical history that includes Colonoscopy (2006); Colonoscopy (N/A, 01/23/2015); Abdominal hysterectomy (1992); Salpingoophorectomy (Bilateral); Esophagogastroduodenoscopy (N/A, 04/21/2017); Esophageal dilation (04/21/2017); Esophagogastroduodenoscopy (egd) with propofol (N/A, 07/07/2017); Esophageal dilation (07/07/2017); Esophagogastroduodenoscopy (egd) with propofol (N/A, 07/28/2017); and Esophageal dilation (N/A, 07/28/2017). Valerie Raymond has a current medication list which includes the following prescription(s): alendronate, artificial tears, calcium carbonate-vit d-min, vitamin d3, premarin, meloxicam, pantoprazole, and baclofen, and the following Facility-Administered Medications: fentanyl and midazolam. Her primarily concern today is the Back Pain (low)  Initial Vital Signs:  Pulse/HCG Rate: 72ECG Heart Rate: 70 Temp: (!) 97.1 F (36.2 C) Resp: 18 BP: (!) 151/85 SpO2: 100 %  BMI: Estimated body mass index is 23.4 kg/m as calculated from the following:   Height as of this encounter: 5\' 6"  (1.676 m).   Weight as of this encounter: 145 lb (65.8 kg).  Risk Assessment: Allergies: Reviewed. She is allergic to lodine [etodolac].  Allergy Precautions: None required Coagulopathies: Reviewed. None identified.  Blood-thinner therapy: None at this time Active Infection(s): Reviewed. None identified. Valerie Raymond is afebrile  Site Confirmation: Valerie Raymond was asked to confirm the procedure and laterality before marking the site Procedure checklist: Completed Consent: Before the procedure and under the influence of no sedative(s), amnesic(s), or anxiolytics, the patient was informed of the treatment options, risks and possible complications. To fulfill our ethical and legal obligations, as recommended by the American Medical Association's Code of Ethics, I have informed the patient of my clinical impression; the nature  and purpose of the treatment or procedure; the risks, benefits, and possible complications of the intervention; the alternatives, including doing nothing; the risk(s) and benefit(s) of the alternative treatment(s) or procedure(s); and the risk(s) and benefit(s) of  doing nothing. The patient was provided information about the general risks and possible complications associated with the procedure. These may include, but are not limited to: failure to achieve desired goals, infection, bleeding, organ or nerve damage, allergic reactions, paralysis, and death. In addition, the patient was informed of those risks and complications associated to Spine-related procedures, such as failure to decrease pain; infection (i.e.: Meningitis, epidural or intraspinal abscess); bleeding (i.e.: epidural hematoma, subarachnoid hemorrhage, or any other type of intraspinal or peri-dural bleeding); organ or nerve damage (i.e.: Any type of peripheral nerve, nerve root, or spinal cord injury) with subsequent damage to sensory, motor, and/or autonomic systems, resulting in permanent pain, numbness, and/or weakness of one or several areas of the body; allergic reactions; (i.e.: anaphylactic reaction); and/or death. Furthermore, the patient was informed of those risks and complications associated with the medications. These include, but are not limited to: allergic reactions (i.e.: anaphylactic or anaphylactoid reaction(s)); adrenal axis suppression; blood sugar elevation that in diabetics may result in ketoacidosis or comma; water retention that in patients with history of congestive heart failure may result in shortness of breath, pulmonary edema, and decompensation with resultant heart failure; weight gain; swelling or edema; medication-induced neural toxicity; particulate matter embolism and blood vessel occlusion with resultant organ, and/or nervous system infarction; and/or aseptic necrosis of one or more joints. Finally, the patient  was informed that Medicine is not an exact science; therefore, there is also the possibility of unforeseen or unpredictable risks and/or possible complications that may result in a catastrophic outcome. The patient indicated having understood very clearly. We have given the patient no guarantees and we have made no promises. Enough time was given to the patient to ask questions, all of which were answered to the patient's satisfaction. Ms. Holtzapple has indicated that she wanted to continue with the procedure. Attestation: I, the ordering provider, attest that I have discussed with the patient the benefits, risks, side-effects, alternatives, likelihood of achieving goals, and potential problems during recovery for the procedure that I have provided informed consent. Date   Time: 10/21/2019  8:50 AM  Pre-Procedure Preparation:  Monitoring: As per clinic protocol. Respiration, ETCO2, SpO2, BP, heart rate and rhythm monitor placed and checked for adequate function Safety Precautions: Patient was assessed for positional comfort and pressure points before starting the procedure. Time-out: I initiated and conducted the "Time-out" before starting the procedure, as per protocol. The patient was asked to participate by confirming the accuracy of the "Time Out" information. Verification of the correct person, site, and procedure were performed and confirmed by me, the nursing staff, and the patient. "Time-out" conducted as per Joint Commission's Universal Protocol (UP.01.01.01). Time: 0919  Description of Procedure:          Laterality: Bilateral. The procedure was performed in identical fashion on both sides. Levels:  L2, L3, L4, L5, & S1 Medial Branch Level(s) Area Prepped: Posterior Lumbosacral Region Prepping solution: DuraPrep (Iodine Povacrylex [0.7% available iodine] and Isopropyl Alcohol, 74% w/w) Safety Precautions: Aspiration looking for blood return was conducted prior to all injections. At no point did we  inject any substances, as a needle was being advanced. Before injecting, the patient was told to immediately notify me if she was experiencing any new onset of "ringing in the ears, or metallic taste in the mouth". No attempts were made at seeking any paresthesias. Safe injection practices and needle disposal techniques used. Medications properly checked for expiration dates. SDV (single dose vial) medications used. After the completion of  the procedure, all disposable equipment used was discarded in the proper designated medical waste containers. Local Anesthesia: Protocol guidelines were followed. The patient was positioned over the fluoroscopy table. The area was prepped in the usual manner. The time-out was completed. The target area was identified using fluoroscopy. A 12-in long, straight, sterile hemostat was used with fluoroscopic guidance to locate the targets for each level blocked. Once located, the skin was marked with an approved surgical skin marker. Once all sites were marked, the skin (epidermis, dermis, and hypodermis), as well as deeper tissues (fat, connective tissue and muscle) were infiltrated with a small amount of a short-acting local anesthetic, loaded on a 10cc syringe with a 25G, 1.5-in  Needle. An appropriate amount of time was allowed for local anesthetics to take effect before proceeding to the next step. Local Anesthetic: Lidocaine 2.0% The unused portion of the local anesthetic was discarded in the proper designated containers. Technical explanation of process:  L2 Medial Branch Nerve Block (MBB): The target area for the L2 medial branch is at the junction of the postero-lateral aspect of the superior articular process and the superior, posterior, and medial edge of the transverse process of L3. Under fluoroscopic guidance, a Quincke needle was inserted until contact was made with os over the superior postero-lateral aspect of the pedicular shadow (target area). After negative  aspiration for blood, 0.5 mL of the nerve block solution was injected without difficulty or complication. The needle was removed intact. L3 Medial Branch Nerve Block (MBB): The target area for the L3 medial branch is at the junction of the postero-lateral aspect of the superior articular process and the superior, posterior, and medial edge of the transverse process of L4. Under fluoroscopic guidance, a Quincke needle was inserted until contact was made with os over the superior postero-lateral aspect of the pedicular shadow (target area). After negative aspiration for blood, 0.5 mL of the nerve block solution was injected without difficulty or complication. The needle was removed intact. L4 Medial Branch Nerve Block (MBB): The target area for the L4 medial branch is at the junction of the postero-lateral aspect of the superior articular process and the superior, posterior, and medial edge of the transverse process of L5. Under fluoroscopic guidance, a Quincke needle was inserted until contact was made with os over the superior postero-lateral aspect of the pedicular shadow (target area). After negative aspiration for blood, 0.5 mL of the nerve block solution was injected without difficulty or complication. The needle was removed intact. L5 Medial Branch Nerve Block (MBB): The target area for the L5 medial branch is at the junction of the postero-lateral aspect of the superior articular process and the superior, posterior, and medial edge of the sacral ala. Under fluoroscopic guidance, a Quincke needle was inserted until contact was made with os over the superior postero-lateral aspect of the pedicular shadow (target area). After negative aspiration for blood, 0.5 mL of the nerve block solution was injected without difficulty or complication. The needle was removed intact. S1 Medial Branch Nerve Block (MBB): The target area for the S1 medial branch is at the posterior and inferior 6 o'clock position of the L5-S1  facet joint. Under fluoroscopic guidance, the Quincke needle inserted for the L5 MBB was redirected until contact was made with os over the inferior and postero aspect of the sacrum, at the 6 o' clock position under the L5-S1 facet joint (Target area). After negative aspiration for blood, 0.5 mL of the nerve block solution was injected without  difficulty or complication. The needle was removed intact.  Nerve block solution: 0.2% PF-Ropivacaine + Triamcinolone (40 mg/mL) diluted to a final concentration of 4 mg of Triamcinolone/mL of Ropivacaine The unused portion of the solution was discarded in the proper designated containers. Procedural Needles: 22-gauge, 3.5-inch, Quincke needles used for all levels.  Once the entire procedure was completed, the treated area was cleaned, making sure to leave some of the prepping solution back to take advantage of its long term bactericidal properties.   Illustration of the posterior view of the lumbar spine and the posterior neural structures. Laminae of L2 through S1 are labeled. DPRL5, dorsal primary ramus of L5; DPRS1, dorsal primary ramus of S1; DPR3, dorsal primary ramus of L3; FJ, facet (zygapophyseal) joint L3-L4; I, inferior articular process of L4; LB1, lateral branch of dorsal primary ramus of L1; IAB, inferior articular branches from L3 medial branch (supplies L4-L5 facet joint); IBP, intermediate branch plexus; MB3, medial branch of dorsal primary ramus of L3; NR3, third lumbar nerve root; S, superior articular process of L5; SAB, superior articular branches from L4 (supplies L4-5 facet joint also); TP3, transverse process of L3.  Vitals:   10/21/19 0929 10/21/19 0939 10/21/19 0949 10/21/19 1000  BP: 121/75 131/75 138/78 137/87  Pulse:      Resp: 16 20 20 20   Temp:  (!) 97.3 F (36.3 C)  (!) 97.4 F (36.3 C)  TempSrc:  Temporal    SpO2: 95% 96% 99% 99%  Weight:      Height:         Start Time: 0919 hrs. End Time:   hrs.  Imaging Guidance  (Spinal):          Type of Imaging Technique: Fluoroscopy Guidance (Spinal) Indication(s): Assistance in needle guidance and placement for procedures requiring needle placement in or near specific anatomical locations not easily accessible without such assistance. Exposure Time: Please see nurses notes. Contrast: None used. Fluoroscopic Guidance: I was personally present during the use of fluoroscopy. "Tunnel Vision Technique" used to obtain the best possible view of the target area. Parallax error corrected before commencing the procedure. "Direction-depth-direction" technique used to introduce the needle under continuous pulsed fluoroscopy. Once target was reached, antero-posterior, oblique, and lateral fluoroscopic projection used confirm needle placement in all planes. Images permanently stored in EMR. Interpretation: No contrast injected. I personally interpreted the imaging intraoperatively. Adequate needle placement confirmed in multiple planes. Permanent images saved into the patient's record.  Antibiotic Prophylaxis:   Anti-infectives (From admission, onward)   None     Indication(s): None identified  Post-operative Assessment:  Post-procedure Vital Signs:  Pulse/HCG Rate: 7272 Temp: (!) 97.4 F (36.3 C) Resp: 20 BP: 137/87 SpO2: 99 %  EBL: None  Complications: No immediate post-treatment complications observed by team, or reported by patient.  Note: The patient tolerated the entire procedure well. A repeat set of vitals were taken after the procedure and the patient was kept under observation following institutional policy, for this type of procedure. Post-procedural neurological assessment was performed, showing return to baseline, prior to discharge. The patient was provided with post-procedure discharge instructions, including a section on how to identify potential problems. Should any problems arise concerning this procedure, the patient was given instructions to  immediately contact us, at any time, without hesitation. In any case, we plan to contact the patient by telephone for a follow-up status report regarding this interventional procedure.  Comments:  No additional relevant information.  Plan of Care  Orders:  Orders  Placed This Encounter  Procedures   LUMBAR FACET(MEDIAL BRANCH NERVE BLOCK) MBNB    Scheduling Instructions:     Procedure: Lumbar facet block (AKA.: Lumbosacral medial branch nerve block)     Side: Bilateral     Level: L3-4, L4-5, & L5-S1 Facets (L2, L3, L4, L5, & S1 Medial Branch Nerves)     Sedation: Patient's choice.     Timeframe: Today    Order Specific Question:   Where will this procedure be performed?    Answer:   [facility]   DG PAIN [facility] C-ARM 1-60 MIN NO REPORT    Intraoperative interpretation by procedural physician at [place].    Standing Status:   Standing    Number of Occurrences:   1    Order Specific Question:   Reason for exam:    Answer:   Assistance in needle guidance and placement for procedures requiring needle placement in or near specific anatomical locations not easily accessible without such assistance.   Informed Consent Details: Physician/Practitioner Attestation; Transcribe to consent form and obtain patient signature    Nursing Order: Transcribe to consent form and obtain patient signature. Note: Always confirm laterality of pain with Ms. Senft, before procedure. Procedure: Lumbar Facet Block  under fluoroscopic guidance Indication/Reason: Low Back Pain, with our without leg pain, due to Facet Joint Arthralgia (Joint Pain) known as Lumbar Facet Syndrome, secondary to Lumbar, and/or Lumbosacral Spondylosis (Arthritis of the Spine), without myelopathy or radiculopathy (Nerve Damage). Provider Attestation: I, [place] Dossie Arbour, MD, (Pain Management Specialist), the physician/practitioner, attest that I have discussed with the patient the benefits, risks, side effects,  alternatives, likelihood of achieving goals and potential problems during recovery for the procedure that I have provided informed consent.   Provide equipment / supplies at bedside    Equipment required: Single use, disposable, "Block Tray"    Standing Status:   Standing    Number of Occurrences:   1    Order Specific Question:   Specify    Answer:   Block Tray   Chronic Opioid Analgesic:  No opioid analgesics prescribed by our practice.   Medications ordered for procedure: Meds ordered this encounter  Medications   lidocaine (XYLOCAINE) 2 % (with pres) injection 400 mg   lactated ringers infusion 1,000 mL   midazolam (VERSED) 5 MG/5ML injection 1-2 mg    Make sure Flumazenil is available in the pyxis when using this medication. If oversedation occurs, administer 0.2 mg IV over 15 sec. If after 45 sec no response, administer 0.2 mg again over 1 min; may repeat at 1 min intervals; not to exceed 4 doses (1 mg)   fentaNYL (SUBLIMAZE) injection 25-50 mcg    Make sure Narcan is available in the pyxis when using this medication. In the event of respiratory depression (RR< 8/min): Titrate NARCAN (naloxone) in increments of 0.1 to 0.2 mg IV at 2-3 minute intervals, until desired degree of reversal.   ropivacaine (PF) 2 mg/mL (0.2%) (NAROPIN) injection 18 mL   triamcinolone acetonide (KENALOG-40) injection 80 mg   Medications administered: We administered lidocaine, lactated ringers, midazolam, fentaNYL, ropivacaine (PF) 2 mg/mL (0.2%), and triamcinolone acetonide.  See the medical record for exact dosing, route, and time of administration.  Follow-up plan:   Return in about 2 weeks (around 11/04/2019) for (VV), (PP).       Interventional management options: Planned, scheduled, and/or pending:   Diagnostic/therapeutic bilateral lumbar facet block #2    Considering:   Diagnostic  bilateral lumbar facet block #2  Possible bilateral vs left lumbar facet RFA    Palliative PRN  treatment(s):   Diagnostic/therapeutic bilateral lumbar facet block #2 (100/90/50) Palliative left IA hip joint injection #2 (100/100/0) Palliative left trochanteric bursa injection #2 (100/50/50)     Recent Visits Date Type Provider Dept  10/11/19 Telemedicine Milinda Pointer, MD [facility]  09/28/19 Procedure visit Milinda Pointer, MD [facility]  09/21/19 Telemedicine Milinda Pointer, MD [facility]  09/07/19 Procedure visit Milinda Pointer, MD [facility]  08/17/19 Procedure visit Milinda Pointer, MD [facility]  08/04/19 Office Visit Milinda Pointer, MD [facility]  Showing recent visits within past 90 days and meeting all other requirements   Today's Visits Date Type Provider Dept  10/21/19 Procedure visit Milinda Pointer, MD [facility]  Showing today's visits and meeting all other requirements   Future Appointments Date Type Provider Dept  11/03/19 Appointment Milinda Pointer, MD [facility]  Showing future appointments within next 90 days and meeting all other requirements   Disposition: Discharge home  Discharge (Date   Time): 10/21/2019; 1002 hrs.   Primary Care Physician: [name], MD Location: [facility] Note by: Gaspar Cola, MD Date: 10/21/2019; Time: 11:06 AM  Disclaimer:  Medicine is not an Chief Strategy Officer. The only guarantee in medicine is that nothing is guaranteed. It is important to note that the decision to proceed with this intervention was based on the information collected from the patient. The Data and conclusions were drawn from the patient's questionnaire, the interview, and the physical examination. Because the information was provided in large part by the patient, it cannot be guaranteed that it has not been purposely or unconsciously manipulated. Every effort has been made to obtain as much relevant data as  possible for this evaluation. It is important to note that the conclusions that lead to this procedure are derived in large part from the available data. Always take into account that the treatment will also be dependent on availability of resources and existing treatment guidelines, considered by other Pain Management Practitioners as being common knowledge and practice, at the time of the intervention. For Medico-Legal purposes, it is also important to point out that variation in procedural techniques and pharmacological choices are the acceptable norm. The indications, contraindications, technique, and results of the above procedure should only be interpreted and judged by a Board-Certified Interventional Pain Specialist with extensive familiarity and expertise in the same exact procedure and technique.

## 2019-10-21 NOTE — Progress Notes (Signed)
Safety precautions to be maintained throughout the outpatient stay will include: orient to surroundings, keep bed in low position, maintain call bell within reach at all times, provide assistance with transfer out of bed and ambulation.  

## 2019-10-22 ENCOUNTER — Other Ambulatory Visit: Payer: Self-pay | Admitting: Internal Medicine

## 2019-10-22 ENCOUNTER — Telehealth: Payer: Self-pay | Admitting: *Deleted

## 2019-10-22 NOTE — Telephone Encounter (Signed)
No problems post procedure. 

## 2019-11-02 ENCOUNTER — Encounter: Payer: Self-pay | Admitting: Pain Medicine

## 2019-11-02 NOTE — Progress Notes (Signed)
Patient: Valerie Raymond  Service Category: E/M  Provider: Gaspar Cola, MD  DOB: 26-Jan-1951  DOS: 11/03/2019  Location: Office  MRN: [id number]  Setting: Ambulatory outpatient  Referring Provider: [name], MD  Type: Established Patient  Specialty: Interventional Pain Management  PCP: [name], MD  Location: Remote location  Delivery: TeleHealth     Virtual Encounter - Pain Management PROVIDER NOTE: Information contained herein reflects review and annotations entered in association with encounter. Interpretation of such information and data should be left to medically-trained personnel. Information provided to patient can be located elsewhere in the medical record under "Patient Instructions". Document created using STT-dictation technology, any transcriptional errors that may result from process are unintentional.    Contact & Pharmacy Preferred: [phone] Home: [phone] (home) Mobile: [phone] (mobile) E-mail: [email or web address]  [facility] [place], [address] AT [address] [place] [address] Phone: [phone] Fax: [phone]   Pre-screening  Ms. Glore offered "in-person" vs "virtual" encounter. She indicated preferring virtual for this encounter.   Reason COVID-19*  Social distancing based on CDC and AMA recommendations.   I contacted Valerie Raymond on 11/03/2019 via telephone.      I clearly identified myself as Gaspar Cola, MD. I verified that I was speaking with the correct person using two identifiers (Name: Kaedynce Tapp, and date of birth: 1950-11-28).  Consent I sought verbal advanced consent from Valerie Raymond for virtual visit interactions. I informed [name] of possible security and privacy concerns, risks, and limitations associated with providing "not-in-person" medical evaluation and management services. I also informed [name] of the availability of  "in-person" appointments. Finally, I informed her that there would be a charge for the virtual visit and that she could be  personally, fully or partially, financially responsible for it. [name] expressed understanding and agreed to proceed.   Historic Elements   Valerie Raymond is a 69 y.o. year old, female patient evaluated today after her last contact with our practice on 10/22/2019. Valerie Raymond  has a past medical history of Arthralgia of hip (07/23/2015), Arthritis, Degenerative disc disease, lumbar, DJD (degenerative joint disease), Dysphagia, Endometriosis, GERD (gastroesophageal reflux disease), Heartburn, Joint pain, Osteoporosis, Pelvic pressure in female (01/02/2016), Rash, and Vitamin D deficiency. She also  has a past surgical history that includes Colonoscopy (2006); Colonoscopy (N/A, 01/23/2015); Abdominal hysterectomy (1992); Salpingoophorectomy (Bilateral); Esophagogastroduodenoscopy (N/A, 04/21/2017); Esophageal dilation (04/21/2017); Esophagogastroduodenoscopy (egd) with propofol (N/A, 07/07/2017); Esophageal dilation (07/07/2017); Esophagogastroduodenoscopy (egd) with propofol (N/A, 07/28/2017); and Esophageal dilation (N/A, 07/28/2017). Ms. [name] has a current medication list which includes the following prescription(s): alendronate, artificial tears, calcium carbonate-vit d-min, vitamin d3, premarin, meloxicam, and pantoprazole. She  reports that she has never smoked. She has never used smokeless tobacco. She reports that she does not drink alcohol or use drugs. Valerie Raymond is allergic to lodine [etodolac].   HPI  Today, she is being contacted for a post-procedure assessment.  The patient did rather well with the second diagnostic injection and therefore I believe her to be an excellent candidate for the radiofrequency ablation.  Today we have talked to the patient about this option and she has decided to go for it.  However, she is concerned about the Covid vaccine and trying to avoid the steroids 2  weeks before or after the vaccine.  I think that this is a very valid concern and I  have provided the patient with some information so that she can make her decision on this.  In addition, it will be completely up to her decide when to schedule that radiofrequency ablation.  Post-Procedure Evaluation  Procedure (10/21/2019): Diagnostic/therapeutic bilateral lumbar facet block #2 under fluoroscopic guidance, and IV sedation Pre-procedure pain level:  1/10 Post-procedure: 0/10 (100% relief)  Sedation: Sedation provided.  Landis Martins, RN  11/02/2019  9:04 AM  Sign when Signing Visit Pain relief after procedure (treated area only): (Questions asked to patient) 1. Starting about 15 minutes after the procedure, and "while the area was still numb" (from the local anesthetics), were you having any of your usual pain "in that area" (the treated area)?  (NOTE: NOT including the discomfort from the needle sticks.) First 1 hour:100 % better. First 4-6 hours: 100 % better. 2. How long did the numbness from the local anesthetics last? (More than 6 hours?) Duration: 24 hours.  3. How much better is your pain now, when compared to before the procedure? Current benefit:60 % better. 4. Can you move better now? Improvement in ROM (Range of Motion): Yes. 5. Can you do more now? Improvement in function: Yes. 4. Did you have any problems with the procedure? Side-effects/Complications: No.  Current benefits: Defined as benefit that persist at this time.   Analgesia:  >50% relief Function: [name] reports improvement in function ROM: [name] reports improvement in ROM    Medical Necessity: [name] has experienced debilitating chronic pain from the Lumbosacral Facet Syndrome (Spondylosis without myelopathy or radiculopathy, lumbosacral region [M47.817]) that has persisted for longer than three months of failed non-surgical care and has either failed to respond, or was unable to tolerate, or simply did  not get enough benefit from other more conservative therapies including, but not limited to: 1. Over-the-counter oral analgesic medications (i.e.: ibuprofen, naproxen, etc.) 2. Anti-inflammatory medications 3. Muscle relaxants 4. Membrane stabilizers 5. Opioids 6. Physical therapy (PT), chiropractic manipulation, and/or home exercise program (HEP). 7. Modalities (Heat, ice, etc.) 8. Invasive techniques such as nerve blocks.  Ms. Bostick has attained greater than 50% reduction in pain from at least two (2) diagnostic medial branch blocks conducted in separate occasions. For this reason, I believe it is medically necessary to proceed with Non-Pulsed Radiofrequency Ablation for the purpose of attempting to prolong the duration of the benefits seen with the diagnostic injections.  Pharmacotherapy Assessment  Analgesic: No opioid analgesics prescribed by our practice.   Monitoring: [place] PMP: PDMP reviewed during this encounter.       Pharmacotherapy: No side-effects or adverse reactions reported. Compliance: No problems identified. Effectiveness: Clinically acceptable. Plan: Refer to "POC".  UDS: No results found for: SUMMARY Laboratory Chemistry Profile   Renal Lab Results  Component Value Date   BUN 10 08/04/2019   CREATININE 0.87 08/04/2019   BCR 11 (L) 08/04/2019   GFRAA 79 08/04/2019   GFRNONAA 69 08/04/2019    Hepatic Lab Results  Component Value Date   AST 24 08/04/2019   ALT 20 04/06/2019   ALBUMIN 4.4 08/04/2019   ALKPHOS 78 08/04/2019    Electrolytes Lab Results  Component Value Date   NA 142 08/04/2019   K 4.7 08/04/2019   CL 105 08/04/2019   CALCIUM 10.5 (H) 08/04/2019   MG 2.3 08/04/2019    Bone Lab Results  Component Value Date   VD25OH 28.6 (L) 03/13/2016   25OHVITD1 39 08/04/2019   25OHVITD2 <1.0 08/04/2019   25OHVITD3 39 08/04/2019  Coagulation Lab Results  Component Value Date   PLT 294 04/06/2019    Cardiovascular Lab Results  Component Value  Date   HGB 13.6 04/06/2019   HCT 41.1 04/06/2019    Inflammation (CRP: Acute Phase) (ESR: Chronic Phase) Lab Results  Component Value Date   CRP 3 08/04/2019   ESRSEDRATE 12 08/04/2019      Note: Above Lab results reviewed.  Imaging  DG PAIN CLINIC C-ARM 1-60 MIN NO REPORT Fluoro was used, but no Radiologist interpretation will be provided.  Please refer to "NOTES" tab for provider progress note.  Assessment  The primary encounter diagnosis was Chronic pain syndrome. Diagnoses of Chronic thigh pain (Primary Area of Pain) (Left), Chronic low back pain (Secondary area of Pain) (Bilateral) (L>R) w/o sciatica, Chronic lower extremity pain (Third area of Pain) (Bilateral) (L>R), and Lumbar facet syndrome (Bilateral) (L>R) were also pertinent to this visit.  Plan of Care  Problem-specific:  No problem-specific Assessment & Plan notes found for this encounter.  I have discontinued Eunice Blase. Goelz "Gaye"'s baclofen. I am also having her maintain her Vitamin D3, Calcium Carbonate-Vit D-Min (CALCIUM 1200 PO), meloxicam, Premarin, pantoprazole, Artificial Tears, and alendronate.  Pharmacotherapy (Medications Ordered): No orders of the defined types were placed in this encounter.  Orders:  Orders Placed This Encounter  Procedures  . Radiofrequency,Lumbar    Standing Status:   Future    Standing Expiration Date:   05/02/2021    Scheduling Instructions:     Side(s): Left-sided     Level: L3-4, L4-5, & L5-S1 Facets (L2, L3, L4, L5, & S1 Medial Branch Nerves)     Sedation: With Sedation     Scheduling Timeframe: As soon as pre-approved    Order Specific Question:   Where will this procedure be performed?    Answer:   [facility]   Follow-up plan:   Return for RFA (w/ sedation): (L) L-FCT RFA #1.      Interventional management options: Planned, scheduled, and/or pending:   Therapeutic bilateral lumbar facet RFA #1 (Starting on left)   Considering:   Diagnostic bilateral lumbar  facet block #2  Possible bilateral vs left lumbar facet RFA    Palliative PRN treatment(s):   Diagnostic/therapeutic bilateral lumbar facet block #3 (100/90/50) Palliative left IA hip joint injection #2 (100/100/0) Palliative left trochanteric bursa injection #2 (100/50/50)    Recent Visits Date Type Provider Dept  10/21/19 Procedure visit Milinda Pointer, MD [facility]  10/11/19 Telemedicine Milinda Pointer, MD [facility]  09/28/19 Procedure visit Milinda Pointer, MD [facility]  09/21/19 Telemedicine Milinda Pointer, MD [facility]  09/07/19 Procedure visit Milinda Pointer, MD [facility]  08/17/19 Procedure visit Milinda Pointer, MD [facility]  Showing recent visits within past 90 days and meeting all other requirements   Today's Visits Date Type Provider Dept  11/03/19 Telemedicine Milinda Pointer, MD [facility]  Showing today's visits and meeting all other requirements   Future Appointments No visits were found meeting these conditions.  Showing future appointments within next 90 days and meeting all other requirements   I discussed the assessment and treatment plan with the patient. The patient was provided an opportunity to ask questions and all were answered. The patient agreed with the plan and demonstrated an understanding of the instructions.  Patient advised to call back or seek an in-person evaluation if the symptoms or condition worsens.  Duration of encounter: 14 minutes.  Note by: Beatriz Chancellor  Farrel Conners, MD Date: 11/03/2019; Time: 9:00 AM

## 2019-11-02 NOTE — Patient Instructions (Addendum)

## 2019-11-02 NOTE — Progress Notes (Signed)
Pain relief after procedure (treated area only): (Questions asked to patient) 1. Starting about 15 minutes after the procedure, and "while the area was still numb" (from the local anesthetics), were you having any of your usual pain "in that area" (the treated area)?  (NOTE: NOT including the discomfort from the needle sticks.) First 1 hour:100 % better. First 4-6 hours: 100 % better. 2. How long did the numbness from the local anesthetics last? (More than 6 hours?) Duration: 24 hours.  3. How much better is your pain now, when compared to before the procedure? Current benefit:60 % better. 4. Can you move better now? Improvement in ROM (Range of Motion): Yes. 5. Can you do more now? Improvement in function: Yes. 4. Did you have any problems with the procedure? Side-effects/Complications: No.

## 2019-11-03 ENCOUNTER — Ambulatory Visit: Payer: Medicare Other | Attending: Pain Medicine | Admitting: Pain Medicine

## 2019-11-03 ENCOUNTER — Other Ambulatory Visit: Payer: Self-pay

## 2019-11-03 DIAGNOSIS — M47816 Spondylosis without myelopathy or radiculopathy, lumbar region: Secondary | ICD-10-CM

## 2019-11-03 DIAGNOSIS — M79605 Pain in left leg: Secondary | ICD-10-CM

## 2019-11-03 DIAGNOSIS — M79652 Pain in left thigh: Secondary | ICD-10-CM | POA: Diagnosis not present

## 2019-11-03 DIAGNOSIS — M545 Low back pain, unspecified: Secondary | ICD-10-CM

## 2019-11-03 DIAGNOSIS — G894 Chronic pain syndrome: Secondary | ICD-10-CM | POA: Diagnosis not present

## 2019-11-03 DIAGNOSIS — G8929 Other chronic pain: Secondary | ICD-10-CM

## 2019-11-03 DIAGNOSIS — M79604 Pain in right leg: Secondary | ICD-10-CM

## 2019-11-11 ENCOUNTER — Ambulatory Visit: Payer: Medicare Other | Attending: Internal Medicine

## 2019-11-11 DIAGNOSIS — Z23 Encounter for immunization: Secondary | ICD-10-CM | POA: Insufficient documentation

## 2019-11-11 NOTE — Progress Notes (Signed)
   Covid-19 Vaccination Clinic  Name:  Valerie Raymond    MRN: [id number] DOB: Dec 16, 1950  11/11/2019  Valerie Raymond was observed post Covid-19 immunization for 15 minutes without incidence. She was provided with Vaccine Information Sheet and instruction to access the V-Safe system.   Valerie Raymond was instructed to call 911 with any severe reactions post vaccine: Marland Kitchen Difficulty breathing  . Swelling of your face and throat  . A fast heartbeat  . A bad rash all over your body  . Dizziness and weakness    Immunizations Administered    Name Date Dose VIS Date Route   Pfizer COVID-19 Vaccine 11/11/2019  9:45 AM 0.3 mL 08/27/2019 Intramuscular   Manufacturer: [place]   Lot: [id number]   [place]: [id number]

## 2019-11-15 ENCOUNTER — Telehealth: Payer: Self-pay | Admitting: Pain Medicine

## 2019-11-15 NOTE — Telephone Encounter (Signed)
Patient is getting 2nd covid vaccine on 3-23 and has to move RFA out. I put her on 12-23-19 at 10:30

## 2019-11-17 ENCOUNTER — Other Ambulatory Visit: Payer: Self-pay | Admitting: Internal Medicine

## 2019-11-17 DIAGNOSIS — M5136 Other intervertebral disc degeneration, lumbar region: Secondary | ICD-10-CM

## 2019-11-30 ENCOUNTER — Ambulatory Visit: Payer: Medicare Other | Admitting: Pain Medicine

## 2019-12-07 ENCOUNTER — Ambulatory Visit: Payer: Medicare Other | Attending: Internal Medicine

## 2019-12-07 DIAGNOSIS — Z23 Encounter for immunization: Secondary | ICD-10-CM

## 2019-12-07 NOTE — Progress Notes (Signed)
   Covid-19 Vaccination Clinic  Name:  Valerie Raymond    MRN: [id number] DOB: 1950-11-16  12/07/2019  [name] was observed post Covid-19 immunization for 15 minutes without incident. She was provided with Vaccine Information Sheet and instruction to access the V-Safe system.   [name] was instructed to call 911 with any severe reactions post vaccine: Marland Kitchen Difficulty breathing  . Swelling of face and throat  . A fast heartbeat  . A bad rash all over body  . Dizziness and weakness   Immunizations Administered    Name Date Dose VIS Date Route   Pfizer COVID-19 Vaccine 12/07/2019  9:20 AM 0.3 mL 08/27/2019 Intramuscular   Manufacturer: [place]   Lot: [id number]   [place]: [id number]

## 2019-12-23 ENCOUNTER — Encounter: Payer: Self-pay | Admitting: Pain Medicine

## 2019-12-23 ENCOUNTER — Other Ambulatory Visit: Payer: Self-pay

## 2019-12-23 ENCOUNTER — Ambulatory Visit (HOSPITAL_BASED_OUTPATIENT_CLINIC_OR_DEPARTMENT_OTHER): Payer: Medicare Other | Admitting: Pain Medicine

## 2019-12-23 ENCOUNTER — Ambulatory Visit
Admission: RE | Admit: 2019-12-23 | Discharge: 2019-12-23 | Disposition: A | Discharge status: Home / Self Care | Payer: Medicare Other | Source: Ambulatory Visit | Attending: Pain Medicine | Admitting: Pain Medicine

## 2019-12-23 VITALS — BP 136/80 | HR 65 | Temp 97.8°F | Resp 21 | Ht 66.0 in | Wt 144.0 lb

## 2019-12-23 DIAGNOSIS — G8929 Other chronic pain: Secondary | ICD-10-CM | POA: Insufficient documentation

## 2019-12-23 DIAGNOSIS — M4310 Spondylolisthesis, site unspecified: Secondary | ICD-10-CM | POA: Diagnosis not present

## 2019-12-23 DIAGNOSIS — M5137 Other intervertebral disc degeneration, lumbosacral region: Secondary | ICD-10-CM

## 2019-12-23 DIAGNOSIS — M47816 Spondylosis without myelopathy or radiculopathy, lumbar region: Secondary | ICD-10-CM | POA: Insufficient documentation

## 2019-12-23 DIAGNOSIS — G8918 Other acute postprocedural pain: Secondary | ICD-10-CM | POA: Diagnosis not present

## 2019-12-23 DIAGNOSIS — M545 Low back pain, unspecified: Secondary | ICD-10-CM

## 2019-12-23 DIAGNOSIS — M47817 Spondylosis without myelopathy or radiculopathy, lumbosacral region: Secondary | ICD-10-CM | POA: Insufficient documentation

## 2019-12-23 MED ORDER — ROPIVACAINE HCL 2 MG/ML IJ SOLN
9.0000 mL | Freq: Once | INTRAMUSCULAR | Status: AC
  Administered 2019-12-23: 9 mL via PERINEURAL
  Filled 2019-12-23: qty 10

## 2019-12-23 MED ORDER — HYDROCODONE-ACETAMINOPHEN 5-325 MG PO TABS: 1.0000 | ORAL_TABLET | Freq: Four times a day (QID) | ORAL | 0 refills | Status: AC | PRN

## 2019-12-23 MED ORDER — FENTANYL CITRATE (PF) 100 MCG/2ML IJ SOLN
25.0000 ug | INTRAMUSCULAR | Status: DC | PRN
  Administered 2019-12-23: 25 ug via INTRAVENOUS
  Filled 2019-12-23: qty 2

## 2019-12-23 MED ORDER — LIDOCAINE HCL 2 % IJ SOLN
20.0000 mL | Freq: Once | INTRAMUSCULAR | Status: AC
  Administered 2019-12-23: 400 mg
  Filled 2019-12-23: qty 20

## 2019-12-23 MED ORDER — MIDAZOLAM HCL 5 MG/5ML IJ SOLN
1.0000 mg | INTRAMUSCULAR | Status: DC | PRN
  Administered 2019-12-23: 11:00:00 0.5 mg via INTRAVENOUS
  Filled 2019-12-23: qty 5

## 2019-12-23 MED ORDER — TRIAMCINOLONE ACETONIDE 40 MG/ML IJ SUSP
40.0000 mg | Freq: Once | INTRAMUSCULAR | Status: AC
  Administered 2019-12-23: 40 mg
  Filled 2019-12-23: qty 1

## 2019-12-23 MED ORDER — LACTATED RINGERS IV SOLN
1000.0000 mL | Freq: Once | INTRAVENOUS | Status: AC
  Administered 2019-12-23: 11:00:00 1000 mL via INTRAVENOUS

## 2019-12-23 NOTE — Progress Notes (Signed)
PROVIDER NOTE: Information contained herein reflects review and annotations entered in association with encounter. Interpretation of such information and data should be left to medically-trained personnel. Information provided to patient can be located elsewhere in the medical record under "Patient Instructions". Document created using STT-dictation technology, any transcriptional errors that may result from process are unintentional.    Patient: [name]  Service Category: Procedure  Provider: Gaspar Cola, MD  DOB: Feb 02, 1951  DOS: 12/23/2019  Location: [facility]  MRN: [id number]  Setting: Ambulatory - outpatient  Referring Provider: Milinda Pointer, MD  Type: Established Patient  Specialty: Interventional Pain Management  PCP: [name], MD   Primary Reason for Visit: Interventional Pain Management Treatment. CC: Back Pain (low)  Procedure:          Anesthesia, Analgesia, Anxiolysis:  Type: Thermal Lumbar Facet, Medial Branch Radiofrequency Ablation/Neurotomy  #1  Primary Purpose: Therapeutic Region: Posterolateral Lumbosacral Spine Level: L2, L3, L4, L5, & S1 Medial Branch Level(s). These levels will denervate the L3-4, L4-5, and the L5-S1 lumbar facet joints. Laterality: Left  Type: Moderate (Conscious) Sedation combined with Local Anesthesia Indication(s): Analgesia and Anxiety Route: Intravenous (IV) IV Access: Secured Sedation: Meaningful verbal contact was maintained at all times during the procedure  Local Anesthetic: Lidocaine 1-2%  Position: Prone   Indications: 1. Lumbar facet syndrome (Bilateral) (L>R)   2. Spondylosis without myelopathy or radiculopathy, lumbosacral region   3. Grade 1 Lumbar Retrolisthesis L3/L4   4. DDD (degenerative disc disease), lumbosacral   5. Lumbar facet arthropathy (Multilevel) (Bilateral)   6. Chronic low back pain (Secondary area of Pain) (Bilateral) (L>R) w/o sciatica    Ms. North has been dealing  with the above chronic pain for longer than three months and has either failed to respond, was unable to tolerate, or simply did not get enough benefit from other more conservative therapies including, but not limited to: 1. Over-the-counter medications 2. Anti-inflammatory medications 3. Muscle relaxants 4. Membrane stabilizers 5. Opioids 6. Physical therapy and/or chiropractic manipulation 7. Modalities (Heat, ice, etc.) 8. Invasive techniques such as nerve blocks. Ms. Brecker has attained more than 50% relief of the pain from a series of diagnostic injections conducted in separate occasions.  Pain Score: Pre-procedure: 3 /10 Post-procedure: 0-No pain/10  Pre-op Assessment:  Ms. Warmuth is a 69 y.o. (year old), female patient, seen today for interventional treatment. She  has a past surgical history that includes Colonoscopy (2006); Colonoscopy (N/A, 01/23/2015); Abdominal hysterectomy (1992); Salpingoophorectomy (Bilateral); Esophagogastroduodenoscopy (N/A, 04/21/2017); Esophageal dilation (04/21/2017); Esophagogastroduodenoscopy (egd) with propofol (N/A, 07/07/2017); Esophageal dilation (07/07/2017); Esophagogastroduodenoscopy (egd) with propofol (N/A, 07/28/2017); and Esophageal dilation (N/A, 07/28/2017). Ms. Avalo has a current medication list which includes the following prescription(s): alendronate, artificial tears, calcium carbonate-vit d-min, vitamin d3, premarin, hydrocodone-acetaminophen, [START ON 12/30/2019] hydrocodone-acetaminophen, meloxicam, and pantoprazole, and the following Facility-Administered Medications: fentanyl and midazolam. Her primarily concern today is the Back Pain (low)  Initial Vital Signs:  Pulse/HCG Rate: 85ECG Heart Rate: 64 Temp: (!) 97.2 F (36.2 C) Resp: 18 BP: (!) 155/84 SpO2: 100 %  BMI: Estimated body mass index is 23.24 kg/m as calculated from the following:   Height as of this encounter: 5\' 6"  (1.676 m).   Weight as of this encounter: 144 lb (65.3  kg).  Risk Assessment: Allergies: Reviewed. She is allergic to lodine [etodolac].  Allergy Precautions: None required Coagulopathies: Reviewed. None identified.  Blood-thinner therapy: None at this time Active Infection(s): Reviewed. None identified. Ms. Debona is afebrile  Site Confirmation: Ms. Phong was asked to confirm the procedure and laterality before marking the site Procedure checklist: Completed Consent: Before the procedure and under the influence of no sedative(s), amnesic(s), or anxiolytics, the patient was informed of the treatment options, risks and possible complications. To fulfill our ethical and legal obligations, as recommended by the American Medical Association's Code of Ethics, I have informed the patient of my clinical impression; the nature and purpose of the treatment or procedure; the risks, benefits, and possible complications of the intervention; the alternatives, including doing nothing; the risk(s) and benefit(s) of the alternative treatment(s) or procedure(s); and the risk(s) and benefit(s) of doing nothing. The patient was provided information about the general risks and possible complications associated with the procedure. These may include, but are not limited to: failure to achieve desired goals, infection, bleeding, organ or nerve damage, allergic reactions, paralysis, and death. In addition, the patient was informed of those risks and complications associated to Spine-related procedures, such as failure to decrease pain; infection (i.e.: Meningitis, epidural or intraspinal abscess); bleeding (i.e.: epidural hematoma, subarachnoid hemorrhage, or any other type of intraspinal or peri-dural bleeding); organ or nerve damage (i.e.: Any type of peripheral nerve, nerve root, or spinal cord injury) with subsequent damage to sensory, motor, and/or autonomic systems, resulting in permanent pain, numbness, and/or weakness of one or several areas of the body; allergic reactions;  (i.e.: anaphylactic reaction); and/or death. Furthermore, the patient was informed of those risks and complications associated with the medications. These include, but are not limited to: allergic reactions (i.e.: anaphylactic or anaphylactoid reaction(s)); adrenal axis suppression; blood sugar elevation that in diabetics may result in ketoacidosis or comma; water retention that in patients with history of congestive heart failure may result in shortness of breath, pulmonary edema, and decompensation with resultant heart failure; weight gain; swelling or edema; medication-induced neural toxicity; particulate matter embolism and blood vessel occlusion with resultant organ, and/or nervous system infarction; and/or aseptic necrosis of one or more joints. Finally, the patient was informed that Medicine is not an exact science; therefore, there is also the possibility of unforeseen or unpredictable risks and/or possible complications that may result in a catastrophic outcome. The patient indicated having understood very clearly. We have given the patient no guarantees and we have made no promises. Enough time was given to the patient to ask questions, all of which were answered to the patient's satisfaction. Ms. Rhead has indicated that she wanted to continue with the procedure. Attestation: I, the ordering provider, attest that I have discussed with the patient the benefits, risks, side-effects, alternatives, likelihood of achieving goals, and potential problems during recovery for the procedure that I have provided informed consent. Date  Time: 12/23/2019 10:12 AM  Pre-Procedure Preparation:  Monitoring: As per clinic protocol. Respiration, ETCO2, SpO2, BP, heart rate and rhythm monitor placed and checked for adequate function Safety Precautions: Patient was assessed for positional comfort and pressure points before starting the procedure. Time-out: I initiated and conducted the "Time-out" before starting the  procedure, as per protocol. The patient was asked to participate by confirming the accuracy of the "Time Out" information. Verification of the correct person, site, and procedure were performed and confirmed by me, the nursing staff, and the patient. "Time-out" conducted as per Joint Commission's Universal Protocol (UP.01.01.01). Time: 1120  Description of Procedure:          Laterality: Left Levels:  L2, L3, L4, L5, & S1 Medial Branch Level(s), at the L3-4, L4-5, and the L5-S1  lumbar facet joints. Area Prepped: Lumbosacral DuraPrep (Iodine Povacrylex [0.7% available iodine] and Isopropyl Alcohol, 74% w/w) Safety Precautions: Aspiration looking for blood return was conducted prior to all injections. At no point did we inject any substances, as a needle was being advanced. Before injecting, the patient was told to immediately notify me if she was experiencing any new onset of "ringing in the ears, or metallic taste in the mouth". No attempts were made at seeking any paresthesias. Safe injection practices and needle disposal techniques used. Medications properly checked for expiration dates. SDV (single dose vial) medications used. After the completion of the procedure, all disposable equipment used was discarded in the proper designated medical waste containers. Local Anesthesia: Protocol guidelines were followed. The patient was positioned over the fluoroscopy table. The area was prepped in the usual manner. The time-out was completed. The target area was identified using fluoroscopy. A 12-in long, straight, sterile hemostat was used with fluoroscopic guidance to locate the targets for each level blocked. Once located, the skin was marked with an approved surgical skin marker. Once all sites were marked, the skin (epidermis, dermis, and hypodermis), as well as deeper tissues (fat, connective tissue and muscle) were infiltrated with a small amount of a short-acting local anesthetic, loaded on a 10cc syringe  with a 25G, 1.5-in  Needle. An appropriate amount of time was allowed for local anesthetics to take effect before proceeding to the next step. Local Anesthetic: Lidocaine 2.0% The unused portion of the local anesthetic was discarded in the proper designated containers. Technical explanation of process:  Radiofrequency Ablation (RFA) L2 Medial Branch Nerve RFA: The target area for the L2 medial branch is at the junction of the postero-lateral aspect of the superior articular process and the superior, posterior, and medial edge of the transverse process of L3. Under fluoroscopic guidance, a Radiofrequency needle was inserted until contact was made with os over the superior postero-lateral aspect of the pedicular shadow (target area). Sensory and motor testing was conducted to properly adjust the position of the needle. Once satisfactory placement of the needle was achieved, the numbing solution was slowly injected after negative aspiration for blood. 2.0 mL of the nerve block solution was injected without difficulty or complication. After waiting for at least 3 minutes, the ablation was performed. Once completed, the needle was removed intact. L3 Medial Branch Nerve RFA: The target area for the L3 medial branch is at the junction of the postero-lateral aspect of the superior articular process and the superior, posterior, and medial edge of the transverse process of L4. Under fluoroscopic guidance, a Radiofrequency needle was inserted until contact was made with os over the superior postero-lateral aspect of the pedicular shadow (target area). Sensory and motor testing was conducted to properly adjust the position of the needle. Once satisfactory placement of the needle was achieved, the numbing solution was slowly injected after negative aspiration for blood. 2.0 mL of the nerve block solution was injected without difficulty or complication. After waiting for at least 3 minutes, the ablation was performed. Once  completed, the needle was removed intact. L4 Medial Branch Nerve RFA: The target area for the L4 medial branch is at the junction of the postero-lateral aspect of the superior articular process and the superior, posterior, and medial edge of the transverse process of L5. Under fluoroscopic guidance, a Radiofrequency needle was inserted until contact was made with os over the superior postero-lateral aspect of the pedicular shadow (target area). Sensory and motor testing was conducted to  properly adjust the position of the needle. Once satisfactory placement of the needle was achieved, the numbing solution was slowly injected after negative aspiration for blood. 2.0 mL of the nerve block solution was injected without difficulty or complication. After waiting for at least 3 minutes, the ablation was performed. Once completed, the needle was removed intact. L5 Medial Branch Nerve RFA: The target area for the L5 medial branch is at the junction of the postero-lateral aspect of the superior articular process of S1 and the superior, posterior, and medial edge of the sacral ala. Under fluoroscopic guidance, a Radiofrequency needle was inserted until contact was made with os over the superior postero-lateral aspect of the pedicular shadow (target area). Sensory and motor testing was conducted to properly adjust the position of the needle. Once satisfactory placement of the needle was achieved, the numbing solution was slowly injected after negative aspiration for blood. 2.0 mL of the nerve block solution was injected without difficulty or complication. After waiting for at least 3 minutes, the ablation was performed. Once completed, the needle was removed intact. S1 Medial Branch Nerve RFA: The target area for the S1 medial branch is located inferior to the junction of the S1 superior articular process and the L5 inferior articular process, posterior, inferior, and lateral to the 6 o'clock position of the L5-S1 facet  joint, just superior to the S1 posterior foramen. Under fluoroscopic guidance, the Radiofrequency needle was advanced until contact was made with os over the Target area. Sensory and motor testing was conducted to properly adjust the position of the needle. Once satisfactory placement of the needle was achieved, the numbing solution was slowly injected after negative aspiration for blood. 2.0 mL of the nerve block solution was injected without difficulty or complication. After waiting for at least 3 minutes, the ablation was performed. Once completed, the needle was removed intact. Radiofrequency lesioning (ablation):  Radiofrequency Generator: NeuroTherm [id number] Sensory Stimulation Parameters: 50 Hz was used to locate & identify the nerve, making sure that the needle was positioned such that there was no sensory stimulation below 0.3 V or above 0.7 V. Motor Stimulation Parameters: 2 Hz was used to evaluate the motor component. Care was taken not to lesion any nerves that demonstrated motor stimulation of the lower extremities at an output of less than 2.5 times that of the sensory threshold, or a maximum of 2.0 V. Lesioning Technique Parameters: Standard Radiofrequency settings. (Not bipolar or pulsed.) Temperature Settings: 80 degrees C Lesioning time: 60 seconds Intra-operative Compliance: Compliant Materials & Medications: Needle(s) (Electrode/Cannula) Type: Teflon-coated, curved tip, Radiofrequency needle(s) Gauge: 22G Length: 10cm Numbing solution: 0.2% PF-Ropivacaine + Triamcinolone (40 mg/mL) diluted to a final concentration of 4 mg of Triamcinolone/mL of Ropivacaine The unused portion of the solution was discarded in the proper designated containers.  Once the entire procedure was completed, the treated area was cleaned, making sure to leave some of the prepping solution back to take advantage of its long term bactericidal properties.  Illustration of the posterior view of the lumbar spine  and the posterior neural structures. Laminae of L2 through S1 are labeled. DPRL5, dorsal primary ramus of L5; DPRS1, dorsal primary ramus of S1; DPR3, dorsal primary ramus of L3; FJ, facet (zygapophyseal) joint L3-L4; I, inferior articular process of L4; LB1, lateral branch of dorsal primary ramus of L1; IAB, inferior articular branches from L3 medial branch (supplies L4-L5 facet joint); IBP, intermediate branch plexus; MB3, medial branch of dorsal primary ramus of L3; NR3, third lumbar nerve  root; S, superior articular process of L5; SAB, superior articular branches from L4 (supplies L4-5 facet joint also); TP3, transverse process of L3.  Vitals:   12/23/19 1145 12/23/19 1152 12/23/19 1203 12/23/19 1213  BP: 121/71 121/81 125/70 136/80  Pulse: 65     Resp: 17 18 (!) 24 (!) 21  Temp:  97.9 F (36.6 C)  97.8 F (36.6 C)  TempSrc:  Temporal  Temporal  SpO2: 98% 95% 97% 100%  Weight:      Height:       Start Time: 1120 hrs. End Time: 1142 hrs.  Imaging Guidance (Spinal):          Type of Imaging Technique: Fluoroscopy Guidance (Spinal) Indication(s): Assistance in needle guidance and placement for procedures requiring needle placement in or near specific anatomical locations not easily accessible without such assistance. Exposure Time: Please see nurses notes. Contrast: None used. Fluoroscopic Guidance: I was personally present during the use of fluoroscopy. "Tunnel Vision Technique" used to obtain the best possible view of the target area. Parallax error corrected before commencing the procedure. "Direction-depth-direction" technique used to introduce the needle under continuous pulsed fluoroscopy. Once target was reached, antero-posterior, oblique, and lateral fluoroscopic projection used confirm needle placement in all planes. Images permanently stored in EMR. Interpretation: No contrast injected. I personally interpreted the imaging intraoperatively. Adequate needle placement confirmed in  multiple planes. Permanent images saved into the patient's record.  Antibiotic Prophylaxis:   Anti-infectives (From admission, onward)   None     Indication(s): None identified  Post-operative Assessment:  Post-procedure Vital Signs:  Pulse/HCG Rate: 6563 Temp: 97.8 F (36.6 C) Resp: (!) 21 BP: 136/80 SpO2: 100 %  EBL: None  Complications: No immediate post-treatment complications observed by team, or reported by patient.  Note: The patient tolerated the entire procedure well. A repeat set of vitals were taken after the procedure and the patient was kept under observation following institutional policy, for this type of procedure. Post-procedural neurological assessment was performed, showing return to baseline, prior to discharge. The patient was provided with post-procedure discharge instructions, including a section on how to identify potential problems. Should any problems arise concerning this procedure, the patient was given instructions to immediately contact us, at any time, without hesitation. In any case, we plan to contact the patient by telephone for a follow-up status report regarding this interventional procedure.  Comments:  No additional relevant information.  Plan of Care  Orders:  Orders Placed This Encounter  Procedures  . Radiofrequency,Lumbar    Scheduling Instructions:     Side(s): Left-sided     Level: L3-4, L4-5, & L5-S1 Facets (L2, L3, L4, L5, & S1 Medial Branch Nerves)     Sedation: With Sedation     Timeframe: Today    Order Specific Question:   Where will this procedure be performed?    Answer:   [facility]  . Radiofrequency,Lumbar    Standing Status:   Future    Standing Expiration Date:   06/23/2021    Scheduling Instructions:     Side(s): Right-sided     Level: L3-4, L4-5, & L5-S1 Facets (L2, L3, L4, L5, & S1 Medial Branch Nerves)     Sedation: With Sedation     Scheduling Timeframe: 2 weeks from now    Order Specific Question:    Where will this procedure be performed?    Answer:   [facility]  . DG PAIN [facility] C-ARM 1-60 MIN NO REPORT  Intraoperative interpretation by procedural physician at [place].    Standing Status:   Standing    Number of Occurrences:   1    Order Specific Question:   Reason for exam:    Answer:   Assistance in needle guidance and placement for procedures requiring needle placement in or near specific anatomical locations not easily accessible without such assistance.  . Informed Consent Details: Physician/Practitioner Attestation; Transcribe to consent form and obtain patient signature    Nursing Order: Transcribe to consent form and obtain patient signature. Note: Always confirm laterality of pain with Ms. Galeno, before procedure. Procedure: Lumbar Facet Radiofrequency Ablation Indication/Reason: Low Back Pain, with our without leg pain, due to Facet Joint Arthralgia (Joint Pain) known as Lumbar Facet Syndrome, secondary to Lumbar, and/or Lumbosacral Spondylosis (Arthritis of the Spine), without myelopathy or radiculopathy (Nerve Damage). Provider Attestation: I, Ola Dossie Arbour, MD, (Pain Management Specialist), the physician/practitioner, attest that I have discussed with the patient the benefits, risks, side effects, alternatives, likelihood of achieving goals and potential problems during recovery for the procedure that I have provided informed consent.  . Provide equipment / supplies at bedside    Equipment required: Sterile "Radiofrequency Tray"; Large hemostat (1); Small hemostat (1); Towels (6-8); 4x4 sterile sponge pack (1) Radiofrequency Needle(s): Size: Regular Quantity: 5    Standing Status:   Standing    Number of Occurrences:   1    Order Specific Question:   Specify    Answer:   Radiofrequency Tray   Chronic Opioid Analgesic:  No opioid analgesics prescribed by our practice.   Medications ordered for procedure: Meds ordered this encounter    Medications  . lidocaine (XYLOCAINE) 2 % (with pres) injection 400 mg  . lactated ringers infusion 1,000 mL  . midazolam (VERSED) 5 MG/5ML injection 1-2 mg    Make sure Flumazenil is available in the pyxis when using this medication. If oversedation occurs, administer 0.2 mg IV over 15 sec. If after 45 sec no response, administer 0.2 mg again over 1 min; may repeat at 1 min intervals; not to exceed 4 doses (1 mg)  . fentaNYL (SUBLIMAZE) injection 25-50 mcg    Make sure Narcan is available in the pyxis when using this medication. In the event of respiratory depression (RR< 8/min): Titrate NARCAN (naloxone) in increments of 0.1 to 0.2 mg IV at 2-3 minute intervals, until desired degree of reversal.  . ropivacaine (PF) 2 mg/mL (0.2%) (NAROPIN) injection 9 mL  . triamcinolone acetonide (KENALOG-40) injection 40 mg  . HYDROcodone-acetaminophen (NORCO/VICODIN) 5-325 MG tablet    Sig: Take 1 tablet by mouth every 6 (six) hours as needed for up to 7 days for severe pain. Must last 7 days.    Dispense:  28 tablet    Refill:  0    For acute post-operative pain. Not to be refilled. Must last 7 days.  Marland Kitchen HYDROcodone-acetaminophen (NORCO/VICODIN) 5-325 MG tablet    Sig: Take 1 tablet by mouth every 6 (six) hours as needed for up to 7 days for severe pain. Must last 7 days.    Dispense:  28 tablet    Refill:  0    For acute post-operative pain. Not to be refilled.  Must last 7 days.   Medications administered: We administered lidocaine, lactated ringers, midazolam, fentaNYL, ropivacaine (PF) 2 mg/mL (0.2%), and triamcinolone acetonide.  See the medical record for exact dosing, route, and time of administration.  Follow-up plan:   Return in about 2  weeks (around 01/06/2020) for RFA (w/ sedation): (R) L-FCT RFA #1.       Interventional management options: Planned, scheduled, and/or pending:   Therapeutic bilateral lumbar facet RFA #1 (Starting on left)   Considering:   Diagnostic bilateral lumbar  facet block #2  Possible bilateral vs left lumbar facet RFA    Palliative PRN treatment(s):   Diagnostic/therapeutic bilateral lumbar facet block #3 (100/90/50) Palliative left IA hip joint injection #2 (100/100/0) Palliative left trochanteric bursa injection #2 (100/50/50)     Recent Visits Date Type Provider Dept  11/03/19 Telemedicine Milinda Pointer, MD [facility]  10/21/19 Procedure visit Milinda Pointer, MD [facility]  10/11/19 Telemedicine Milinda Pointer, MD [facility]  09/28/19 Procedure visit Milinda Pointer, MD [facility]  Showing recent visits within past 90 days and meeting all other requirements   Today's Visits Date Type Provider Dept  12/23/19 Procedure visit Milinda Pointer, MD [facility]  Showing today's visits and meeting all other requirements   Future Appointments No visits were found meeting these conditions.  Showing future appointments within next 90 days and meeting all other requirements   Disposition: Discharge home  Discharge (Date  Time): 12/23/2019; 1214 hrs.   Primary Care Physician: [name], MD Location: [facility] Note by: Gaspar Cola, MD Date: 12/23/2019; Time: 12:20 PM  Disclaimer:  Medicine is not an Chief Strategy Officer. The only guarantee in medicine is that nothing is guaranteed. It is important to note that the decision to proceed with this intervention was based on the information collected from the patient. The Data and conclusions were drawn from the patient's questionnaire, the interview, and the physical examination. Because the information was provided in large part by the patient, it cannot be guaranteed that it has not been purposely or unconsciously manipulated. Every effort has been made to obtain as much relevant data as possible for this evaluation. It is important to note that the conclusions that lead to this procedure are  derived in large part from the available data. Always take into account that the treatment will also be dependent on availability of resources and existing treatment guidelines, considered by other Pain Management Practitioners as being common knowledge and practice, at the time of the intervention. For Medico-Legal purposes, it is also important to point out that variation in procedural techniques and pharmacological choices are the acceptable norm. The indications, contraindications, technique, and results of the above procedure should only be interpreted and judged by a Board-Certified Interventional Pain Specialist with extensive familiarity and expertise in the same exact procedure and technique.

## 2019-12-23 NOTE — Progress Notes (Signed)
Safety precautions to be maintained throughout the outpatient stay will include: orient to surroundings, keep bed in low position, maintain call bell within reach at all times, provide assistance with transfer out of bed and ambulation.  

## 2019-12-23 NOTE — Patient Instructions (Signed)

## 2019-12-24 ENCOUNTER — Telehealth: Payer: Self-pay | Admitting: *Deleted

## 2019-12-24 NOTE — Telephone Encounter (Signed)
No problems post procedure. 

## 2020-01-17 DIAGNOSIS — D2262 Melanocytic nevi of left upper limb, including shoulder: Secondary | ICD-10-CM | POA: Diagnosis not present

## 2020-01-17 DIAGNOSIS — L578 Other skin changes due to chronic exposure to nonionizing radiation: Secondary | ICD-10-CM | POA: Diagnosis not present

## 2020-01-17 DIAGNOSIS — D485 Neoplasm of uncertain behavior of skin: Secondary | ICD-10-CM | POA: Diagnosis not present

## 2020-02-01 ENCOUNTER — Ambulatory Visit (HOSPITAL_BASED_OUTPATIENT_CLINIC_OR_DEPARTMENT_OTHER): Payer: Medicare Other | Admitting: Pain Medicine

## 2020-02-01 ENCOUNTER — Ambulatory Visit
Admission: RE | Admit: 2020-02-01 | Discharge: 2020-02-01 | Disposition: A | Discharge status: Home / Self Care | Payer: Medicare Other | Source: Ambulatory Visit | Attending: Pain Medicine | Admitting: Pain Medicine

## 2020-02-01 ENCOUNTER — Other Ambulatory Visit: Payer: Self-pay

## 2020-02-01 ENCOUNTER — Encounter: Payer: Self-pay | Admitting: Pain Medicine

## 2020-02-01 VITALS — BP 145/76 | HR 85 | Temp 97.3°F | Resp 18 | Ht 66.0 in | Wt 144.0 lb

## 2020-02-01 DIAGNOSIS — G8929 Other chronic pain: Secondary | ICD-10-CM | POA: Insufficient documentation

## 2020-02-01 DIAGNOSIS — M47817 Spondylosis without myelopathy or radiculopathy, lumbosacral region: Secondary | ICD-10-CM | POA: Diagnosis not present

## 2020-02-01 DIAGNOSIS — M47816 Spondylosis without myelopathy or radiculopathy, lumbar region: Secondary | ICD-10-CM | POA: Insufficient documentation

## 2020-02-01 DIAGNOSIS — M545 Low back pain, unspecified: Secondary | ICD-10-CM

## 2020-02-01 DIAGNOSIS — M5137 Other intervertebral disc degeneration, lumbosacral region: Secondary | ICD-10-CM | POA: Diagnosis not present

## 2020-02-01 DIAGNOSIS — M4310 Spondylolisthesis, site unspecified: Secondary | ICD-10-CM | POA: Diagnosis not present

## 2020-02-01 DIAGNOSIS — G8918 Other acute postprocedural pain: Secondary | ICD-10-CM

## 2020-02-01 MED ORDER — HYDROCODONE-ACETAMINOPHEN 5-325 MG PO TABS: 1.0000 | ORAL_TABLET | Freq: Four times a day (QID) | ORAL | 0 refills | Status: AC | PRN

## 2020-02-01 MED ORDER — FENTANYL CITRATE (PF) 100 MCG/2ML IJ SOLN
25.0000 ug | INTRAMUSCULAR | Status: DC | PRN
  Administered 2020-02-01: 25 ug via INTRAVENOUS

## 2020-02-01 MED ORDER — ROPIVACAINE HCL 2 MG/ML IJ SOLN
INTRAMUSCULAR | Status: AC
  Filled 2020-02-01: qty 10

## 2020-02-01 MED ORDER — TRIAMCINOLONE ACETONIDE 40 MG/ML IJ SUSP
40.0000 mg | Freq: Once | INTRAMUSCULAR | Status: AC
  Administered 2020-02-01: 40 mg

## 2020-02-01 MED ORDER — MIDAZOLAM HCL 5 MG/5ML IJ SOLN
INTRAMUSCULAR | Status: AC
  Filled 2020-02-01: qty 5

## 2020-02-01 MED ORDER — ROPIVACAINE HCL 2 MG/ML IJ SOLN
9.0000 mL | Freq: Once | INTRAMUSCULAR | Status: AC
  Administered 2020-02-01: 10 mL via PERINEURAL

## 2020-02-01 MED ORDER — LIDOCAINE HCL 2 % IJ SOLN
INTRAMUSCULAR | Status: AC
  Filled 2020-02-01: qty 20

## 2020-02-01 MED ORDER — LACTATED RINGERS IV SOLN
1000.0000 mL | Freq: Once | INTRAVENOUS | Status: AC
  Administered 2020-02-01: 1000 mL via INTRAVENOUS

## 2020-02-01 MED ORDER — TRIAMCINOLONE ACETONIDE 40 MG/ML IJ SUSP
INTRAMUSCULAR | Status: AC
  Filled 2020-02-01: qty 1

## 2020-02-01 MED ORDER — MIDAZOLAM HCL 5 MG/5ML IJ SOLN
1.0000 mg | INTRAMUSCULAR | Status: DC | PRN
  Administered 2020-02-01: 0.5 mg via INTRAVENOUS

## 2020-02-01 MED ORDER — LIDOCAINE HCL 2 % IJ SOLN
20.0000 mL | Freq: Once | INTRAMUSCULAR | Status: AC
  Administered 2020-02-01: 400 mg

## 2020-02-01 MED ORDER — FENTANYL CITRATE (PF) 100 MCG/2ML IJ SOLN
INTRAMUSCULAR | Status: AC
  Filled 2020-02-01: qty 2

## 2020-02-01 NOTE — Patient Instructions (Addendum)
___________________________________________________________________________________________  Post-Radiofrequency (RF) Discharge Instructions  You have just completed a Radiofrequency Neurotomy.  The following instructions will provide you with information and guidelines for self-care upon discharge.  If at any time you have questions or concerns please call your physician. DO NOT DRIVE YOURSELF!!  Instructions:  Apply ice: Fill a plastic sandwich bag with crushed ice. Cover it with a small towel and apply to injection site. Apply for 15 minutes then remove x 15 minutes. Repeat sequence on day of procedure, until you go to bed. The purpose is to minimize swelling and discomfort after procedure.  Apply heat: Apply heat to procedure site starting the day following the procedure. The purpose is to treat any soreness and discomfort from the procedure.  Food intake: No eating limitations, unless stipulated above.  Nevertheless, if you have had sedation, you may experience some nausea.  In this case, it may be wise to wait at least two hours prior to resuming regular diet.  Physical activities: Keep activities to a minimum for the first 8 hours after the procedure. For the first 24 hours after the procedure, do not drive a motor vehicle,  Operate heavy machinery, power tools, or handle any weapons.  Consider walking with the use of an assistive device or accompanied by an adult for the first 24 hours.  Do not drink alcoholic beverages including beer.  Do not make any important decisions or sign any legal documents. Go home and rest today.  Resume activities tomorrow, as tolerated.  Use caution in moving about as you may experience mild leg weakness.  Use caution in cooking, use of household electrical appliances and climbing steps.  Driving: If you have received any sedation, you are not allowed to drive for 24 hours after your procedure.  Blood thinner: Restart your blood thinner 6 hours after your  procedure. (Only for those taking blood thinners)  Insulin: As soon as you can eat, you may resume your normal dosing schedule. (Only for those taking insulin)  Medications: May resume pre-procedure medications.  Do not take any drugs, other than what has been prescribed to you.  Infection prevention: Keep procedure site clean and dry.  Post-procedure Pain Diary: Extremely important that this be done correctly and accurately. Recorded information will be used to determine the next step in treatment.  Pain evaluated is that of treated area only. Do not include pain from an untreated area.  Complete every hour, on the hour, for the initial 8 hours. Set an alarm to help you do this part accurately.  Do not go to sleep and have it completed later. It will not be accurate.  Follow-up appointment: Keep your follow-up appointment after the procedure. Usually 2-6 weeks after radiofrequency. Bring you pain diary. The information collected will be essential for your long-term care.   Expect:  From numbing medicine (AKA: Local Anesthetics): Numbness or decrease in pain.  Onset: Full effect within 15 minutes of injected.  Duration: It will depend on the type of local anesthetic used. On the average, 1 to 8 hours.   From steroids (when added): Decrease in swelling or inflammation. Once inflammation is improved, relief of the pain will follow.  Onset of benefits: Depends on the amount of swelling present. The more swelling, the longer it will take for the benefits to be seen. In some cases, up to 10 days.  Duration: Steroids will stay in the system x 2 weeks. Duration of benefits will depend on multiple posibilities including persistent irritating factors.    From procedure: Some discomfort is to be expected once the numbing medicine wears off. In the case of radiofrequency procedures, this may last as long as 6 weeks. Additional post-procedure pain medication is provided for this. Discomfort is  minimized if ice and heat are applied as instructed.  Call if:  You experience numbness and weakness that gets worse with time, as opposed to wearing off.  He experience any unusual bleeding, difficulty breathing, or loss of the ability to control your bowel and bladder. (This applies to Spinal procedures only)  You experience any redness, swelling, heat, red streaks, elevated temperature, fever, or any other signs of a possible infection.  Emergency Numbers:  [place] hours (Monday - Thursday, 8:00 AM - 4:00 PM) (Friday, 9:00 AM - 12:00 Noon): [phone]  After hours: [phone] ____________________________________________________________________________________________   Pain Management Discharge Instructions  General Discharge Instructions :  If you need to reach your doctor call: Monday-Friday 8:00 am - 4:00 pm at [phone] or toll free [phone].  After clinic hours [phone] to have operator reach doctor.  Bring all of your medication bottles to all your appointments in the pain clinic.  To cancel or reschedule your appointment with Pain Management please remember to call 24 hours in advance to avoid a fee.  Refer to the educational materials which you have been given on: General Risks, I had my Procedure. Discharge Instructions, Post Sedation.  Post Procedure Instructions:  The drugs you were given will stay in your system until tomorrow, so for the next 24 hours you should not drive, make any legal decisions or drink any alcoholic beverages.  You may eat anything you prefer, but it is better to start with liquids then soups and crackers, and gradually work up to solid foods.  Please notify your doctor immediately if you have any unusual bleeding, trouble breathing or pain that is not related to your normal pain.  Depending on the type of procedure that was done, some parts of your body may feel week and/or numb.  This usually clears up by  tonight or the next day.  Walk with the use of an assistive device or accompanied by an adult for the 24 hours.  You may use ice on the affected area for the first 24 hours.  Put ice in a Ziploc bag and cover with a towel and place against area 15 minutes on 15 minutes off.  You may switch to heat after 24 hours. Radiofrequency Lesioning Radiofrequency lesioning is a procedure that is performed to relieve pain. The procedure is often used for back, neck, or arm pain. Radiofrequency lesioning involves the use of a machine that creates radio waves to make heat. During the procedure, the heat is applied to the nerve that carries the pain signal. The heat damages the nerve and interferes with the pain signal. Pain relief usually starts about 2 weeks after the procedure and lasts for 6 months to 1 year. You will be awake during the procedure. You will need to be able to talk with the health care provider during the procedure. Tell a health care provider about:  Any allergies you have.  All medicines you are taking, including vitamins, herbs, eye drops, creams, and over-the-counter medicines.  Any problems you or family members have had with anesthetic medicines.  Any blood disorders you have.  Any surgeries you have had.  Any medical conditions you have or have had.  Whether you are pregnant or may be pregnant. What are the  risks? Generally, this is a safe procedure. However, problems may occur, including:  Pain or soreness at the injection site.  Allergic reaction to medicines given during the procedure.  Bleeding.  Infection at the injection site.  Damage to nerves or blood vessels. What happens before the procedure? Staying hydrated Follow instructions from your health care provider about hydration, which may include:  Up to 2 hours before the procedure - you may continue to drink clear liquids, such as water, clear fruit juice, black coffee, and plain tea. Eating and  drinking Follow instructions from your health care provider about eating and drinking, which may include:  8 hours before the procedure - stop eating heavy meals or foods, such as meat, fried foods, or fatty foods.  6 hours before the procedure - stop eating light meals or foods, such as toast or cereal.  6 hours before the procedure - stop drinking milk or drinks that contain milk.  2 hours before the procedure - stop drinking clear liquids. Medicines Ask your health care provider about:  Changing or stopping your regular medicines. This is especially important if you are taking diabetes medicines or blood thinners.  Taking medicines such as aspirin and ibuprofen. These medicines can thin your blood. Do not take these medicines unless your health care provider tells you to take them.  Taking over-the-counter medicines, vitamins, herbs, and supplements. General instructions  Plan to have someone take you home from the hospital or clinic.  If you will be going home right after the procedure, plan to have someone with you for 24 hours.  Ask your health care provider what steps will be taken to help prevent infection. These may include: ? Removing hair at the procedure site. ? Washing skin with a germ-killing soap. ? Taking antibiotic medicine. What happens during the procedure?   An IV will be inserted into one of your veins.  You will be given one or more of the following: ? A medicine to help you relax (sedative). ? A medicine to numb the area (local anesthetic).  Your health care provider will insert a radiofrequency needle into the area to be treated. This is done with the help of a type of X-ray (fluoroscopy).  A wire that carries the radio waves (electrode) will be put through the radiofrequency needle.  An electrical pulse will be sent through the electrode to verify the correct nerve that is causing your pain. You will feel a tingling sensation, and you may have muscle  twitching.  The tissue around the needle tip will be heated by an electric current that comes from the radiofrequency machine. This will numb the nerves.  The needle will be removed.  A bandage (dressing) will be put on the insertion area. The procedure may vary among health care providers and hospitals. What happens after the procedure?  Your blood pressure, heart rate, breathing rate, and blood oxygen level will be monitored until you leave the hospital or clinic.  Return to your normal activities as told by your health care provider. Ask your health care provider what activities are safe for you.  Do not drive for 24 hours if you were given a sedative during your procedure. Summary  Radiofrequency lesioning is a procedure that is performed to relieve pain. The procedure is often used for back, neck, or arm pain.  Radiofrequency lesioning involves the use of a machine that creates radio waves to make heat.  Plan to have someone take you home from the hospital  or clinic. Do not drive for 24 hours if you were given a sedative during your procedure.  Return to your normal activities as told by your health care provider. Ask your health care provider what activities are safe for you. This information is not intended to replace advice given to you by your health care provider. Make sure you discuss any questions you have with your health care provider. Document Revised: 05/21/2018 Document Reviewed: 05/21/2018 [employer] Patient Education  [place].

## 2020-02-01 NOTE — Progress Notes (Signed)
PROVIDER NOTE: Information contained herein reflects review and annotations entered in association with encounter. Interpretation of such information and data should be left to medically-trained personnel. Information provided to patient can be located elsewhere in the medical record under "Patient Instructions". Document created using STT-dictation technology, any transcriptional errors that may result from process are unintentional.    Patient: Valerie Raymond  Service Category: Procedure  Provider: Gaspar Cola, MD  DOB: Jun 21, 1951  DOS: 02/01/2020  Location: [facility]  MRN: [id number]  Setting: Ambulatory - outpatient  Referring Provider: Milinda Pointer, MD  Type: Established Patient  Specialty: Interventional Pain Management  PCP: [name], MD   Primary Reason for Visit: Interventional Pain Management Treatment. CC: Back Pain (lower radiates into thighs)  Procedure:          Anesthesia, Analgesia, Anxiolysis:  Type: Thermal Lumbar Facet, Medial Branch Radiofrequency Ablation/Neurotomy  #1  Primary Purpose: Therapeutic Region: Posterolateral Lumbosacral Spine Level: L2, L3, L4, L5, & S1 Medial Branch Level(s). These levels will denervate the L3-4, L4-5, and the L5-S1 lumbar facet joints. Laterality: Right  Type: Moderate (Conscious) Sedation combined with Local Anesthesia Indication(s): Analgesia and Anxiety Route: Intravenous (IV) IV Access: Secured Sedation: Meaningful verbal contact was maintained at all times during the procedure  Local Anesthetic: Lidocaine 1-2%  Position: Prone   Indications: 1. Lumbar facet syndrome (Bilateral) (L>R)   2. Spondylosis without myelopathy or radiculopathy, lumbosacral region   3. Lumbar facet arthropathy (Multilevel) (Bilateral)   4. Grade 1 Lumbar Retrolisthesis L3/L4   5. DDD (degenerative disc disease), lumbosacral   6. Chronic low back pain (Secondary area of Pain) (Bilateral) (L>R) w/o sciatica    Ms.  Corio has been dealing with the above chronic pain for longer than three months and has either failed to respond, was unable to tolerate, or simply did not get enough benefit from other more conservative therapies including, but not limited to: 1. Over-the-counter medications 2. Anti-inflammatory medications 3. Muscle relaxants 4. Membrane stabilizers 5. Opioids 6. Physical therapy and/or chiropractic manipulation 7. Modalities (Heat, ice, etc.) 8. Invasive techniques such as nerve blocks. Ms. Warrick has attained more than 50% relief of the pain from a series of diagnostic injections conducted in separate occasions.  Pain Score: Pre-procedure: 1 /10 Post-procedure: 0-No pain/10  Post-Procedure Evaluation  Procedure: Therapeutic left-sided lumbar facet radiofrequency ablation #1 under fluoroscopic guidance and IV sedation Pre-procedure pain level: 3/10 Post-procedure: 0/10 (100% relief)  Sedation: Sedation provided.  Effectiveness during initial hour after procedure(Ultra-Short Term Relief): 100 % .  Local anesthetic used: Long-acting (4-6 hours) Effectiveness: Defined as any analgesic benefit obtained secondary to the administration of local anesthetics. This carries significant diagnostic value as to the etiological location, or anatomical origin, of the pain. Duration of benefit is expected to coincide with the duration of the local anesthetic used.  Effectiveness during initial 4-6 hours after procedure(Short-Term Relief): 0 % .  Long-term benefit: Defined as any relief past the pharmacologic duration of the local anesthetics.  Effectiveness past the initial 6 hours after procedure(Long-Term Relief): 90 %(90% for top of leg and 50% for side of leg) .  Current benefits: Defined as benefit that persist at this time.   Analgesia:  >75% relief Function: Ms. Arambula reports improvement in function ROM: Ms. Dillashaw reports improvement in ROM  Pre-op Assessment:  Ms. Cardone is a 69 y.o. (year  old), female patient, seen today for interventional treatment. She  has a past surgical history that includes Colonoscopy (2006);  Colonoscopy (N/A, 01/23/2015); Abdominal hysterectomy (1992); Salpingoophorectomy (Bilateral); Esophagogastroduodenoscopy (N/A, 04/21/2017); Esophageal dilation (04/21/2017); Esophagogastroduodenoscopy (egd) with propofol (N/A, 07/07/2017); Esophageal dilation (07/07/2017); Esophagogastroduodenoscopy (egd) with propofol (N/A, 07/28/2017); and Esophageal dilation (N/A, 07/28/2017). Ms. Kowalke has a current medication list which includes the following prescription(s): alendronate, artificial tears, calcium carbonate-vit d-min, vitamin d3, premarin, meloxicam, pantoprazole, hydrocodone-acetaminophen, and [START ON 02/08/2020] hydrocodone-acetaminophen, and the following Facility-Administered Medications: fentanyl and midazolam. Her primarily concern today is the Back Pain (lower radiates into thighs)  Initial Vital Signs:  Pulse/HCG Rate: 85ECG Heart Rate: 67 Temp: (!) 97.3 F (36.3 C) Resp: 18 BP: (!) 153/90 SpO2: 100 %  BMI: Estimated body mass index is 23.24 kg/m as calculated from the following:   Height as of this encounter: 5\' 6"  (1.676 m).   Weight as of this encounter: 144 lb (65.3 kg).  Risk Assessment: Allergies: Reviewed. She is allergic to lodine [etodolac].  Allergy Precautions: None required Coagulopathies: Reviewed. None identified.  Blood-thinner therapy: None at this time Active Infection(s): Reviewed. None identified. Ms. Rodeheaver is afebrile  Site Confirmation: Ms. Yacko was asked to confirm the procedure and laterality before marking the site Procedure checklist: Completed Consent: Before the procedure and under the influence of no sedative(s), amnesic(s), or anxiolytics, the patient was informed of the treatment options, risks and possible complications. To fulfill our ethical and legal obligations, as recommended by the American Medical Association's Code of  Ethics, I have informed the patient of my clinical impression; the nature and purpose of the treatment or procedure; the risks, benefits, and possible complications of the intervention; the alternatives, including doing nothing; the risk(s) and benefit(s) of the alternative treatment(s) or procedure(s); and the risk(s) and benefit(s) of doing nothing. The patient was provided information about the general risks and possible complications associated with the procedure. These may include, but are not limited to: failure to achieve desired goals, infection, bleeding, organ or nerve damage, allergic reactions, paralysis, and death. In addition, the patient was informed of those risks and complications associated to Spine-related procedures, such as failure to decrease pain; infection (i.e.: Meningitis, epidural or intraspinal abscess); bleeding (i.e.: epidural hematoma, subarachnoid hemorrhage, or any other type of intraspinal or peri-dural bleeding); organ or nerve damage (i.e.: Any type of peripheral nerve, nerve root, or spinal cord injury) with subsequent damage to sensory, motor, and/or autonomic systems, resulting in permanent pain, numbness, and/or weakness of one or several areas of the body; allergic reactions; (i.e.: anaphylactic reaction); and/or death. Furthermore, the patient was informed of those risks and complications associated with the medications. These include, but are not limited to: allergic reactions (i.e.: anaphylactic or anaphylactoid reaction(s)); adrenal axis suppression; blood sugar elevation that in diabetics may result in ketoacidosis or comma; water retention that in patients with history of congestive heart failure may result in shortness of breath, pulmonary edema, and decompensation with resultant heart failure; weight gain; swelling or edema; medication-induced neural toxicity; particulate matter embolism and blood vessel occlusion with resultant organ, and/or nervous system  infarction; and/or aseptic necrosis of one or more joints. Finally, the patient was informed that Medicine is not an exact science; therefore, there is also the possibility of unforeseen or unpredictable risks and/or possible complications that may result in a catastrophic outcome. The patient indicated having understood very clearly. We have given the patient no guarantees and we have made no promises. Enough time was given to the patient to ask questions, all of which were answered to the patient's satisfaction. Ms. Petterson has indicated that  she wanted to continue with the procedure. Attestation: I, the ordering provider, attest that I have discussed with the patient the benefits, risks, side-effects, alternatives, likelihood of achieving goals, and potential problems during recovery for the procedure that I have provided informed consent. Date  Time: 02/01/2020  9:54 AM  Pre-Procedure Preparation:  Monitoring: As per clinic protocol. Respiration, ETCO2, SpO2, BP, heart rate and rhythm monitor placed and checked for adequate function Safety Precautions: Patient was assessed for positional comfort and pressure points before starting the procedure. Time-out: I initiated and conducted the "Time-out" before starting the procedure, as per protocol. The patient was asked to participate by confirming the accuracy of the "Time Out" information. Verification of the correct person, site, and procedure were performed and confirmed by me, the nursing staff, and the patient. "Time-out" conducted as per Joint Commission's Universal Protocol (UP.01.01.01). Time: 1035  Description of Procedure:          Laterality: Right Levels:  L2, L3, L4, L5, & S1 Medial Branch Level(s), at the L3-4, L4-5, and the L5-S1 lumbar facet joints. Area Prepped: Lumbosacral DuraPrep (Iodine Povacrylex [0.7% available iodine] and Isopropyl Alcohol, 74% w/w) Safety Precautions: Aspiration looking for blood return was conducted prior to all  injections. At no point did we inject any substances, as a needle was being advanced. Before injecting, the patient was told to immediately notify me if she was experiencing any new onset of "ringing in the ears, or metallic taste in the mouth". No attempts were made at seeking any paresthesias. Safe injection practices and needle disposal techniques used. Medications properly checked for expiration dates. SDV (single dose vial) medications used. After the completion of the procedure, all disposable equipment used was discarded in the proper designated medical waste containers. Local Anesthesia: Protocol guidelines were followed. The patient was positioned over the fluoroscopy table. The area was prepped in the usual manner. The time-out was completed. The target area was identified using fluoroscopy. A 12-in long, straight, sterile hemostat was used with fluoroscopic guidance to locate the targets for each level blocked. Once located, the skin was marked with an approved surgical skin marker. Once all sites were marked, the skin (epidermis, dermis, and hypodermis), as well as deeper tissues (fat, connective tissue and muscle) were infiltrated with a small amount of a short-acting local anesthetic, loaded on a 10cc syringe with a 25G, 1.5-in  Needle. An appropriate amount of time was allowed for local anesthetics to take effect before proceeding to the next step. Local Anesthetic: Lidocaine 2.0% The unused portion of the local anesthetic was discarded in the proper designated containers. Technical explanation of process:  Radiofrequency Ablation (RFA) L2 Medial Branch Nerve RFA: The target area for the L2 medial branch is at the junction of the postero-lateral aspect of the superior articular process and the superior, posterior, and medial edge of the transverse process of L3. Under fluoroscopic guidance, a Radiofrequency needle was inserted until contact was made with os over the superior postero-lateral  aspect of the pedicular shadow (target area). Sensory and motor testing was conducted to properly adjust the position of the needle. Once satisfactory placement of the needle was achieved, the numbing solution was slowly injected after negative aspiration for blood. 2.0 mL of the nerve block solution was injected without difficulty or complication. After waiting for at least 3 minutes, the ablation was performed. Once completed, the needle was removed intact. L3 Medial Branch Nerve RFA: The target area for the L3 medial branch is at the junction  of the postero-lateral aspect of the superior articular process and the superior, posterior, and medial edge of the transverse process of L4. Under fluoroscopic guidance, a Radiofrequency needle was inserted until contact was made with os over the superior postero-lateral aspect of the pedicular shadow (target area). Sensory and motor testing was conducted to properly adjust the position of the needle. Once satisfactory placement of the needle was achieved, the numbing solution was slowly injected after negative aspiration for blood. 2.0 mL of the nerve block solution was injected without difficulty or complication. After waiting for at least 3 minutes, the ablation was performed. Once completed, the needle was removed intact. L4 Medial Branch Nerve RFA: The target area for the L4 medial branch is at the junction of the postero-lateral aspect of the superior articular process and the superior, posterior, and medial edge of the transverse process of L5. Under fluoroscopic guidance, a Radiofrequency needle was inserted until contact was made with os over the superior postero-lateral aspect of the pedicular shadow (target area). Sensory and motor testing was conducted to properly adjust the position of the needle. Once satisfactory placement of the needle was achieved, the numbing solution was slowly injected after negative aspiration for blood. 2.0 mL of the nerve block  solution was injected without difficulty or complication. After waiting for at least 3 minutes, the ablation was performed. Once completed, the needle was removed intact. L5 Medial Branch Nerve RFA: The target area for the L5 medial branch is at the junction of the postero-lateral aspect of the superior articular process of S1 and the superior, posterior, and medial edge of the sacral ala. Under fluoroscopic guidance, a Radiofrequency needle was inserted until contact was made with os over the superior postero-lateral aspect of the pedicular shadow (target area). Sensory and motor testing was conducted to properly adjust the position of the needle. Once satisfactory placement of the needle was achieved, the numbing solution was slowly injected after negative aspiration for blood. 2.0 mL of the nerve block solution was injected without difficulty or complication. After waiting for at least 3 minutes, the ablation was performed. Once completed, the needle was removed intact. S1 Medial Branch Nerve RFA: The target area for the S1 medial branch is located inferior to the junction of the S1 superior articular process and the L5 inferior articular process, posterior, inferior, and lateral to the 6 o'clock position of the L5-S1 facet joint, just superior to the S1 posterior foramen. Under fluoroscopic guidance, the Radiofrequency needle was advanced until contact was made with os over the Target area. Sensory and motor testing was conducted to properly adjust the position of the needle. Once satisfactory placement of the needle was achieved, the numbing solution was slowly injected after negative aspiration for blood. 2.0 mL of the nerve block solution was injected without difficulty or complication. After waiting for at least 3 minutes, the ablation was performed. Once completed, the needle was removed intact. Radiofrequency lesioning (ablation):  Radiofrequency Generator: NeuroTherm [id number] Sensory Stimulation  Parameters: 50 Hz was used to locate & identify the nerve, making sure that the needle was positioned such that there was no sensory stimulation below 0.3 V or above 0.7 V. Motor Stimulation Parameters: 2 Hz was used to evaluate the motor component. Care was taken not to lesion any nerves that demonstrated motor stimulation of the lower extremities at an output of less than 2.5 times that of the sensory threshold, or a maximum of 2.0 V. Lesioning Technique Parameters: Standard Radiofrequency settings. (Not bipolar  or pulsed.) Temperature Settings: 80 degrees C Lesioning time: 60 seconds Intra-operative Compliance: Compliant Materials & Medications: Needle(s) (Electrode/Cannula) Type: Teflon-coated, curved tip, Radiofrequency needle(s) Gauge: 22G Length: 10cm Numbing solution: 0.2% PF-Ropivacaine + Triamcinolone (40 mg/mL) diluted to a final concentration of 4 mg of Triamcinolone/mL of Ropivacaine The unused portion of the solution was discarded in the proper designated containers.  Once the entire procedure was completed, the treated area was cleaned, making sure to leave some of the prepping solution back to take advantage of its long term bactericidal properties.  Illustration of the posterior view of the lumbar spine and the posterior neural structures. Laminae of L2 through S1 are labeled. DPRL5, dorsal primary ramus of L5; DPRS1, dorsal primary ramus of S1; DPR3, dorsal primary ramus of L3; FJ, facet (zygapophyseal) joint L3-L4; I, inferior articular process of L4; LB1, lateral branch of dorsal primary ramus of L1; IAB, inferior articular branches from L3 medial branch (supplies L4-L5 facet joint); IBP, intermediate branch plexus; MB3, medial branch of dorsal primary ramus of L3; NR3, third lumbar nerve root; S, superior articular process of L5; SAB, superior articular branches from L4 (supplies L4-5 facet joint also); TP3, transverse process of L3.  Vitals:   02/01/20 1057 02/01/20 1102  02/01/20 1112 02/01/20 1122  BP: 136/84 (!) 142/87 133/76 (!) 147/93  Pulse:      Resp: 18 20 20 16   Temp:      TempSrc:      SpO2: 92% 93% 96% 97%  Weight:      Height:       Start Time: 1036 hrs. End Time: 1102 hrs.  Imaging Guidance (Spinal):          Type of Imaging Technique: Fluoroscopy Guidance (Spinal) Indication(s): Assistance in needle guidance and placement for procedures requiring needle placement in or near specific anatomical locations not easily accessible without such assistance. Exposure Time: Please see nurses notes. Contrast: None used. Fluoroscopic Guidance: I was personally present during the use of fluoroscopy. "Tunnel Vision Technique" used to obtain the best possible view of the target area. Parallax error corrected before commencing the procedure. "Direction-depth-direction" technique used to introduce the needle under continuous pulsed fluoroscopy. Once target was reached, antero-posterior, oblique, and lateral fluoroscopic projection used confirm needle placement in all planes. Images permanently stored in EMR. Interpretation: No contrast injected. I personally interpreted the imaging intraoperatively. Adequate needle placement confirmed in multiple planes. Permanent images saved into the patient's record.  Antibiotic Prophylaxis:   Anti-infectives (From admission, onward)   None     Indication(s): None identified  Post-operative Assessment:  Post-procedure Vital Signs:  Pulse/HCG Rate: 8574 Temp: (!) 97.3 F (36.3 C) Resp: 16 BP: (!) 147/93 SpO2: 97 %  EBL: None  Complications: No immediate post-treatment complications observed by team, or reported by patient.  Note: The patient tolerated the entire procedure well. A repeat set of vitals were taken after the procedure and the patient was kept under observation following institutional policy, for this type of procedure. Post-procedural neurological assessment was performed, showing return to  baseline, prior to discharge. The patient was provided with post-procedure discharge instructions, including a section on how to identify potential problems. Should any problems arise concerning this procedure, the patient was given instructions to immediately contact us, at any time, without hesitation. In any case, we plan to contact the patient by telephone for a follow-up status report regarding this interventional procedure.  Comments:  No additional relevant information.  Plan of Care  Orders:  Orders  Placed This Encounter  Procedures  . Radiofrequency,Lumbar    Scheduling Instructions:     Side(s): Right-sided     Level: L3-4, L4-5, & L5-S1 Facets (L2, L3, L4, L5, & S1 Medial Branch Nerves)     Sedation: With Sedation     Timeframe: Today    Order Specific Question:   Where will this procedure be performed?    Answer:   [facility]  . DG PA[facility] C-ARM 1-60 MIN NO REPORT    Intraoperative interpretation by procedural physician at [place].    Standing Status:   Standing    Number of Occurrences:   1    Order Specific Question:   Reason for exam:    Answer:   Assistance in needle guidance and placement for procedures requiring needle placement in or near specific anatomical locations not easily accessible without such assistance.  . Informed Consent Details: Physician/Practitioner Attestation; Transcribe to consent form and obtain patient signature    Nursing Order: Transcribe to consent form and obtain patient signature. Note: Always confirm laterality of pain with Ms. Rambert, before procedure. Procedure: Lumbar Facet Radiofrequency Ablation Indication/Reason: Low Back Pain, with our without leg pain, due to Facet Joint Arthralgia (Joint Pain) known as Lumbar Facet Syndrome, secondary to Lumbar, and/or Lumbosacral Spondylosis (Arthritis of the Spine), without myelopathy or radiculopathy (Nerve Damage). Provider Attestation: I, [place] Dossie Arbour, MD, (Pain  Management Specialist), the physician/practitioner, attest that I have discussed with the patient the benefits, risks, side effects, alternatives, likelihood of achieving goals and potential problems during recovery for the procedure that I have provided informed consent.  . Provide equipment / supplies at bedside    Equipment required: Sterile "Radiofrequency Tray"; Large hemostat (1); Small hemostat (1); Towels (6-8); 4x4 sterile sponge pack (1) Radiofrequency Needle(s): Size: Regular Quantity: 5    Standing Status:   Standing    Number of Occurrences:   1    Order Specific Question:   Specify    Answer:   Radiofrequency Tray   Chronic Opioid Analgesic:  No opioid analgesics prescribed by our practice.   Medications ordered for procedure: Meds ordered this encounter  Medications  . lidocaine (XYLOCAINE) 2 % (with pres) injection 400 mg  . lactated ringers infusion 1,000 mL  . midazolam (VERSED) 5 MG/5ML injection 1-2 mg    Make sure Flumazenil is available in the pyxis when using this medication. If oversedation occurs, administer 0.2 mg IV over 15 sec. If after 45 sec no response, administer 0.2 mg again over 1 min; may repeat at 1 min intervals; not to exceed 4 doses (1 mg)  . fentaNYL (SUBLIMAZE) injection 25-50 mcg    Make sure Narcan is available in the pyxis when using this medication. In the event of respiratory depression (RR< 8/min): Titrate NARCAN (naloxone) in increments of 0.1 to 0.2 mg IV at 2-3 minute intervals, until desired degree of reversal.  . ropivacaine (PF) 2 mg/mL (0.2%) (NAROPIN) injection 9 mL  . triamcinolone acetonide (KENALOG-40) injection 40 mg  . HYDROcodone-acetaminophen (NORCO/VICODIN) 5-325 MG tablet    Sig: Take 1 tablet by mouth every 6 (six) hours as needed for up to 7 days for severe pain. Must last 7 days.    Dispense:  28 tablet    Refill:  0    For acute post-operative pain. Not to be refilled. Must last 7 days.  Marland Kitchen HYDROcodone-acetaminophen  (NORCO/VICODIN) 5-325 MG tablet    Sig: Take 1 tablet by mouth every  6 (six) hours as needed for up to 7 days for severe pain. Must last 7 days.    Dispense:  28 tablet    Refill:  0    For acute post-operative pain. Not to be refilled.  Must last 7 days.   Medications administered: We administered lidocaine, lactated ringers, midazolam, fentaNYL, ropivacaine (PF) 2 mg/mL (0.2%), and triamcinolone acetonide.  See the medical record for exact dosing, route, and time of administration.  Follow-up plan:   Return in about 6 weeks (around 03/14/2020) for (VV), (PP).       Interventional management options: Planned, scheduled, and/or pending:   Therapeutic right lumbar facet RFA #1 (today)   Considering:   Diagnostic bilateral lumbar facet block #2  Possible bilateral vs left lumbar facet RFA    Palliative PRN treatment(s):   Diagnostic/therapeutic bilateral lumbar facet block #3 (100/90/50) Palliative left IA hip joint injection #2 (100/100/0) Palliative left trochanteric bursa injection #2 (100/50/50)  Therapeutic left lumbar facet RFA #2 (last done 12/23/2019)    Recent Visits Date Type Provider Dept  12/23/19 Procedure visit Milinda Pointer, MD [facility]  11/03/19 Telemedicine Milinda Pointer, MD [facility]  Showing recent visits within past 90 days and meeting all other requirements   Today's Visits Date Type Provider Dept  02/01/20 Procedure visit Milinda Pointer, MD [facility]  Showing today's visits and meeting all other requirements   Future Appointments Date Type Provider Dept  03/15/20 Appointment Milinda Pointer, MD [facility]  Showing future appointments within next 90 days and meeting all other requirements   Disposition: Discharge home  Discharge (Date  Time): 02/01/2020; 1135 hrs.   Primary Care Physician: [name], MD Location: [facility] Note by: Gaspar Cola, MD Date: 02/01/2020; Time: 11:30 AM  Disclaimer:  Medicine is not an Chief Strategy Officer. The only guarantee in medicine is that nothing is guaranteed. It is important to note that the decision to proceed with this intervention was based on the information collected from the patient. The Data and conclusions were drawn from the patient's questionnaire, the interview, and the physical examination. Because the information was provided in large part by the patient, it cannot be guaranteed that it has not been purposely or unconsciously manipulated. Every effort has been made to obtain as much relevant data as possible for this evaluation. It is important to note that the conclusions that lead to this procedure are derived in large part from the available data. Always take into account that the treatment will also be dependent on availability of resources and existing treatment guidelines, considered by other Pain Management Practitioners as being common knowledge and practice, at the time of the intervention. For Medico-Legal purposes, it is also important to point out that variation in procedural techniques and pharmacological choices are the acceptable norm. The indications, contraindications, technique, and results of the above procedure should only be interpreted and judged by a Board-Certified Interventional Pain Specialist with extensive familiarity and expertise in the same exact procedure and technique.

## 2020-02-02 ENCOUNTER — Telehealth: Payer: Self-pay

## 2020-02-02 NOTE — Telephone Encounter (Signed)
Post procedure phone call.  Patient states she is doing great 

## 2020-02-24 ENCOUNTER — Other Ambulatory Visit: Payer: Self-pay | Admitting: Internal Medicine

## 2020-02-24 DIAGNOSIS — Z1231 Encounter for screening mammogram for malignant neoplasm of breast: Secondary | ICD-10-CM

## 2020-03-14 ENCOUNTER — Encounter: Payer: Self-pay | Admitting: Pain Medicine

## 2020-03-14 NOTE — Progress Notes (Signed)
Patient: Valerie Raymond  Service Category: E/M  Provider: Gaspar Cola, MD  DOB: 12-05-1950  DOS: 03/15/2020  Location: Office  MRN: [id number]  Setting: Ambulatory outpatient  Referring Provider: [name], MD  Type: Established Patient  Specialty: Interventional Pain Management  PCP: [name], MD  Location: Remote location  Delivery: TeleHealth     Virtual Encounter - Pain Management PROVIDER NOTE: Information contained herein reflects review and annotations entered in association with encounter. Interpretation of such information and data should be left to medically-trained personnel. Information provided to patient can be located elsewhere in the medical record under "Patient Instructions". Document created using STT-dictation technology, any transcriptional errors that may result from process are unintentional.    Contact & Pharmacy Preferred: [phone] Home: [phone] (home) Mobile: [phone] (mobile) E-mail: [email or web address] [place], [address] AT [address] [place] [address] Phone: [phone] Fax: [phone]   Pre-screening  Ms. Chenevert offered "in-person" vs "virtual" encounter. She indicated preferring virtual for this encounter.   Reason COVID-19*  Social distancing based on CDC and AMA recommendations.   I contacted [name] on 03/15/2020 via telephone.      I clearly identified myself as Gaspar Cola, MD. I verified that I was speaking with the correct person using two identifiers (Name: Tinlee Navarrette, and date of birth: 1950-12-06).  Consent I sought verbal advanced consent from [name] for virtual visit interactions. I informed Ms. Brunell of possible security and privacy concerns, risks, and limitations associated with providing "not-in-person" medical evaluation and management services. I also informed Ms. Sangiovanni of the availability of  "in-person" appointments. Finally, I informed her that there would be a charge for the virtual visit and that she could be  personally, fully or partially, financially responsible for it. Ms. Hendel expressed understanding and agreed to proceed.   Historic Elements   Ms. Fern Canova is a 69 y.o. year old, female patient evaluated today after her last contact with our practice on 02/02/2020. Ms. Cura  has a past medical history of Arthralgia of hip (07/23/2015), Arthritis, Degenerative disc disease, lumbar, DJD (degenerative joint disease), Dysphagia, Endometriosis, GERD (gastroesophageal reflux disease), Heartburn, Joint pain, Osteoporosis, Pelvic pressure in female (01/02/2016), Rash, and Vitamin D deficiency. She also  has a past surgical history that includes Colonoscopy (2006); Colonoscopy (N/A, 01/23/2015); Abdominal hysterectomy (1992); Salpingoophorectomy (Bilateral); Esophagogastroduodenoscopy (N/A, 04/21/2017); Esophageal dilation (04/21/2017); Esophagogastroduodenoscopy (egd) with propofol (N/A, 07/07/2017); Esophageal dilation (07/07/2017); Esophagogastroduodenoscopy (egd) with propofol (N/A, 07/28/2017); and Esophageal dilation (N/A, 07/28/2017). Ms. Pitney has a current medication list which includes the following prescription(s): alendronate, artificial tears, calcium carbonate-vit d-min, vitamin d3, premarin, meloxicam, and pantoprazole. She  reports that she has never smoked. She has never used smokeless tobacco. She reports that she does not drink alcohol and does not use drugs. Ms. Batdorf is allergic to lodine [etodolac].   HPI  Today, she is being contacted for a post-procedure assessment.  Today I had a long conversation with this patient regarding the results of her recent radiofrequency ablation on Feb 01, 2020.  She indicates having experienced 100% relief of the pain for the duration of the local anesthetic followed by a decrease to a 55% improvement which over time has gotten better.  In fact she  indicates that today she is having absolutely no pain in the right side of her lower back.  However,  she has pointed out that her worst side has always been the left one.  We did a radiofrequency ablation of the lumbar facets on the left side on December 23, 2019.  She indicates that this provided her with a considerable amount of relief on that left side.  For the duration of the local anesthetic she experienced 100% relief and this was then followed by a decrease to approximately 90% relief of her low back pain on the left side and 100% relief of her pain in the thigh area, which used to be her primary pain.  However she only attained 50% relief of the pain in the lateral aspect of her thigh.  Previously we had done an intra-articular hip joint injection and a trochanteric bursa injection on separate occasions and they did provide her with some benefit.  Right now she describes this left hip pain as being deep in the area of the hip and worsening with prolonged standing as well as pain in the lateral aspect of the hip over the trochanteric bursa.  In view of this, I have suggested to the patient to go ahead and repeat the hip joint injection on the trochanteric bursa injection, but this time doing both of them at the same time.  The plan of care was communicated to the patient and she understood and agreed.  She has decided to go ahead with the left intra-articular hip joint injection and a trochanteric bursa injection, as soon as possible.  Post-Procedure Evaluation  Procedure: (Feb 01, 2020) therapeutic right lumbar facet RFA #1 under fluoroscopic guidance and IV sedation Pre-procedure pain level: 1/10 Post-procedure: 0/10 (100% relief)  Sedation: Sedation provided.  Effectiveness during initial hour after procedure(Ultra-Short Term Relief): 100 %.  Local anesthetic used: Long-acting (4-6 hours) Effectiveness: Defined as any analgesic benefit obtained secondary to the administration of local anesthetics.  This carries significant diagnostic value as to the etiological location, or anatomical origin, of the pain. Duration of benefit is expected to coincide with the duration of the local anesthetic used.  Effectiveness during initial 4-6 hours after procedure(Short-Term Relief): 100 %.  Long-term benefit: Defined as any relief past the pharmacologic duration of the local anesthetics.  Effectiveness past the initial 6 hours after procedure(Long-Term Relief): 55 %.  Current benefits: Defined as benefit that persist at this time.   Analgesia:  >50% relief Function: Ms. Hack reports improvement in function ROM: Ms. Molinaro reports improvement in ROM  Pharmacotherapy Assessment  Analgesic: No opioid analgesics prescribed by our practice.   Monitoring: [place] PMP: PDMP reviewed during this encounter.       Pharmacotherapy: No side-effects or adverse reactions reported. Compliance: No problems identified. Effectiveness: Clinically acceptable. Plan: Refer to "POC".  UDS: No results found for: SUMMARY  Laboratory Chemistry Profile   Renal Lab Results  Component Value Date   BUN 10 08/04/2019   CREATININE 0.87 08/04/2019   BCR 11 (L) 08/04/2019   GFRAA 79 08/04/2019   GFRNONAA 69 08/04/2019     Hepatic Lab Results  Component Value Date   AST 24 08/04/2019   ALT 20 04/06/2019   ALBUMIN 4.4 08/04/2019   ALKPHOS 78 08/04/2019     Electrolytes Lab Results  Component Value Date   NA 142 08/04/2019   K 4.7 08/04/2019   CL 105 08/04/2019   CALCIUM 10.5 (H) 08/04/2019   MG 2.3 08/04/2019     Bone Lab Results  Component Value Date   VD25OH 28.6 (L) 03/13/2016   25OHVITD1 39  08/04/2019   25OHVITD2 <1.0 08/04/2019   25OHVITD3 39 08/04/2019     Inflammation (CRP: Acute Phase) (ESR: Chronic Phase) Lab Results  Component Value Date   CRP 3 08/04/2019   ESRSEDRATE 12 08/04/2019       Note: Above Lab results reviewed.   Imaging  DG PAIN CLINIC C-ARM 1-60 MIN NO REPORT Fluoro was  used, but no Radiologist interpretation will be provided.  Please refer to "NOTES" tab for provider progress note.  Assessment  The primary encounter diagnosis was Chronic pain syndrome. 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  Plan of Care  Problem-specific:  No problem-specific Assessment & Plan notes found for this encounter.  Ms. Jamiya Nims has a current medication list which includes the following long-term medication(s): calcium carbonate-vit d-min and pantoprazole.  Pharmacotherapy (Medications Ordered): No orders of the defined types were placed in this encounter.  Orders:  Orders Placed This Encounter  Procedures  . HIP INJECTION    Standing Status:   Future    Standing Expiration Date:   06/15/2020    Scheduling Instructions:     Side: Left-sided     Sedation: No Sedation.     Timeframe: ASAA  . HIP INJECTION    Standing Status:   Future    Standing Expiration Date:   06/15/2020    Scheduling Instructions:     Purpose: Diagnostic     Indication: Hip pain 2ry to Trochanteric Burlitis left (M70.62).     Side: Left-sided     Sedation: No Sedation.     Timeframe: ASAA   Follow-up plan:   Return for Procedure (no sedation): (L) Hip inj. #2 + (L) TBI #2.      Interventional management options: Planned, scheduled, and/or pending:      Considering:   Diagnostic left IA hip joint injection #2  Diagnostic left trochanteric bursa injection #2    Palliative PRN treatment(s):   Diagnostic/therapeutic bilateral lumbar facet block #3 (100/90/50) Palliative left IA hip joint  injection #2 (100/100/0) Palliative left trochanteric bursa injection #2 (100/50/50)  Palliative left lumbar facet RFA #2 (last done 12/23/2019) (100/0/90% for the top of the leg and 50% for the lateral aspect of the leg) Palliative right lumbar facet RFA #2 (last done 02/01/2020) (100/100/55)     Recent Visits Date Type Provider Dept  02/01/20 Procedure visit Milinda Pointer, MD [facility]  12/23/19 Procedure visit Milinda Pointer, MD [facility]  Showing recent visits within past 90 days and meeting all other requirements Today's Visits Date Type Provider Dept  03/15/20 Telemedicine Milinda Pointer, MD [facility]  Showing today's visits and meeting all other requirements Future Appointments No visits were found meeting these conditions. Showing future appointments within next 90 days and meeting all other requirements  I discussed the assessment and treatment plan with the patient. The patient was provided an opportunity to ask questions and all were answered. The patient agreed with the plan and demonstrated an understanding of the instructions.  Patient advised to call back or seek an in-person evaluation if the symptoms or condition worsens.  Duration of encounter: 25 minutes.  Note by:  Gaspar Cola, MD Date: 03/15/2020; Time: 10:22 AM

## 2020-03-15 ENCOUNTER — Other Ambulatory Visit: Payer: Self-pay

## 2020-03-15 ENCOUNTER — Ambulatory Visit: Payer: Medicare Other | Attending: Pain Medicine | Admitting: Pain Medicine

## 2020-03-15 ENCOUNTER — Telehealth: Payer: Self-pay | Admitting: Pain Medicine

## 2020-03-15 DIAGNOSIS — G8929 Other chronic pain: Secondary | ICD-10-CM

## 2020-03-15 DIAGNOSIS — G894 Chronic pain syndrome: Secondary | ICD-10-CM | POA: Diagnosis not present

## 2020-03-15 DIAGNOSIS — M545 Low back pain, unspecified: Secondary | ICD-10-CM

## 2020-03-15 DIAGNOSIS — M79652 Pain in left thigh: Secondary | ICD-10-CM | POA: Diagnosis not present

## 2020-03-15 DIAGNOSIS — M4310 Spondylolisthesis, site unspecified: Secondary | ICD-10-CM

## 2020-03-15 DIAGNOSIS — M79605 Pain in left leg: Secondary | ICD-10-CM

## 2020-03-15 DIAGNOSIS — M4807 Spinal stenosis, lumbosacral region: Secondary | ICD-10-CM

## 2020-03-15 DIAGNOSIS — M79604 Pain in right leg: Secondary | ICD-10-CM

## 2020-03-15 DIAGNOSIS — M48061 Spinal stenosis, lumbar region without neurogenic claudication: Secondary | ICD-10-CM | POA: Diagnosis not present

## 2020-03-15 DIAGNOSIS — M25552 Pain in left hip: Secondary | ICD-10-CM

## 2020-03-15 DIAGNOSIS — M7062 Trochanteric bursitis, left hip: Secondary | ICD-10-CM

## 2020-03-15 NOTE — Patient Instructions (Signed)
____________________________________________________________________________________________  Preparing for your procedure (without sedation)  Procedure appointments are limited to planned procedures: . No Prescription Refills. . No disability issues will be discussed. . No medication changes will be discussed.  Instructions: . Oral Intake: Do not eat or drink anything for at least 6 hours prior to your procedure. (Exception: Blood Pressure Medication. See below.) . Transportation: Unless otherwise stated by your physician, you may drive yourself after the procedure. . Blood Pressure Medicine: Do not forget to take your blood pressure medicine with a sip of water the morning of the procedure. If your Diastolic (lower reading)is above 100 mmHg, elective cases will be cancelled/rescheduled. . Blood thinners: These will need to be stopped for procedures. Notify our staff if you are taking any blood thinners. Depending on which one you take, there will be specific instructions on how and when to stop it. . Diabetics on insulin: Notify the staff so that you can be scheduled 1st case in the morning. If your diabetes requires high dose insulin, take only  of your normal insulin dose the morning of the procedure and notify the staff that you have done so. . Preventing infections: Shower with an antibacterial soap the morning of your procedure.  . Build-up your immune system: Take 1000 mg of Vitamin C with every meal (3 times a day) the day prior to your procedure. . Antibiotics: Inform the staff if you have a condition or reason that requires you to take antibiotics before dental procedures. . Pregnancy: If you are pregnant, call and cancel the procedure. . Sickness: If you have a cold, fever, or any active infections, call and cancel the procedure. . Arrival: You must be in the facility at least 30 minutes prior to your scheduled procedure. . Children: Do not bring any children with you. . Dress  appropriately: Bring dark clothing that you would not mind if they get stained. . Valuables: Do not bring any jewelry or valuables.  Reasons to call and reschedule or cancel your procedure: (Following these recommendations will minimize the risk of a serious complication.) . Surgeries: Avoid having procedures within 2 weeks of any surgery. (Avoid for 2 weeks before or after any surgery). . Flu Shots: Avoid having procedures within 2 weeks of a flu shots or . (Avoid for 2 weeks before or after immunizations). . Barium: Avoid having a procedure within 7-10 days after having had a radiological study involving the use of radiological contrast. (Myelograms, Barium swallow or enema study). . Heart attacks: Avoid any elective procedures or surgeries for the initial 6 months after a "Myocardial Infarction" (Heart Attack). . Blood thinners: It is imperative that you stop these medications before procedures. Let us know if you if you take any blood thinner.  . Infection: Avoid procedures during or within two weeks of an infection (including chest colds or gastrointestinal problems). Symptoms associated with infections include: Localized redness, fever, chills, night sweats or profuse sweating, burning sensation when voiding, cough, congestion, stuffiness, runny nose, sore throat, diarrhea, nausea, vomiting, cold or Flu symptoms, recent or current infections. It is specially important if the infection is over the area that we intend to treat. . Heart and lung problems: Symptoms that may suggest an active cardiopulmonary problem include: cough, chest pain, breathing difficulties or shortness of breath, dizziness, ankle swelling, uncontrolled high or unusually low blood pressure, and/or palpitations. If you are experiencing any of these symptoms, cancel your procedure and contact your primary care physician for an evaluation.  Remember:  Regular   Business hours are:  Monday to Thursday 8:00 AM to 4:00  PM  Provider's Schedule: Jaise Moser, MD:  Procedure days: Tuesday and Thursday 7:30 AM to 4:00 PM  Bilal Lateef, MD:  Procedure days: Monday and Wednesday 7:30 AM to 4:00 PM ____________________________________________________________________________________________    

## 2020-03-15 NOTE — Telephone Encounter (Signed)
Left voicemail for pt to return call to set up procedure appt.

## 2020-03-22 DIAGNOSIS — M1612 Unilateral primary osteoarthritis, left hip: Secondary | ICD-10-CM | POA: Insufficient documentation

## 2020-03-22 NOTE — Progress Notes (Signed)
PROVIDER NOTE: Information contained herein reflects review and annotations entered in association with encounter. Interpretation of such information and data should be left to medically-trained personnel. Information provided to patient can be located elsewhere in the medical record under "Patient Instructions". Document created using STT-dictation technology, any transcriptional errors that may result from process are unintentional.    Patient: Valerie Raymond  Service Category: Procedure  Provider: Gaspar Cola, MD  DOB: 1951-09-08  DOS: 03/23/2020  Location: [facility]  MRN: [id number]  Setting: Ambulatory - outpatient  Referring Provider: [name], MD  Type: Established Patient  Specialty: Interventional Pain Management  PCP: [name], MD   Primary Reason for Visit: Interventional Pain Management Treatment. CC: Hip Pain (runs toward outside of the keft leg.)  Procedure #1:  Anesthesia, Analgesia, Anxiolysis:  Type: Intra-Articular Hip Injection #2  Primary Purpose: Therapeutic Region: Posterolateral hip joint area. Level: Lower pelvic and hip joint level. Target Area: Superior aspect of the hip joint cavity, going thru the superior portion of the capsular ligament. Approach: Posterolateral approach. Laterality: Left  Type: Local Anesthesia Indication(s): Analgesia         Route: Infiltration ([place]/IM) IV Access: Declined Sedation: Declined  Local Anesthetic: Lidocaine 1-2%  Position: Lateral Decubitus with bad side up Area Prepped: Entire Posterolateral hip area. DuraPrep (Iodine Povacrylex [0.7% available iodine] and Isopropyl Alcohol, 74% w/w)   Procedure #2:    Type: Gluteofemoral Bursa Injection #2  Primary Purpose: Therapeutic Region: Upper (proximal) Femoral Region Level: Hip Joint Target Area: Superior aspect of the hip joint cavity, going thru the superior portion of the capsular ligament. Approach: Posterolateral  approach Laterality: Left     Indications: 1. Chronic hip pain (Left)   2. Osteoarthritis of hip (Left)   3. Chronic thigh pain (1ry area of Pain) (Left)   4. Greater trochanteric bursitis of hip (Left)   5. Trochanteric bursitis of hips (Bilateral) (L>R)   6. Lower extremity burning sensation (thigh) (Left)    Pain Score: Pre-procedure: 2 /10 Post-procedure: 0-No pain/10   Pre-op Assessment:  Valerie Raymond is a 69 y.o. (year old), female patient, seen today for interventional treatment. She  has a past surgical history that includes Colonoscopy (2006); Colonoscopy (N/A, 01/23/2015); Abdominal hysterectomy (1992); Salpingoophorectomy (Bilateral); Esophagogastroduodenoscopy (N/A, 04/21/2017); Esophageal dilation (04/21/2017); Esophagogastroduodenoscopy (egd) with propofol (N/A, 07/07/2017); Esophageal dilation (07/07/2017); Esophagogastroduodenoscopy (egd) with propofol (N/A, 07/28/2017); and Esophageal dilation (N/A, 07/28/2017). Valerie Raymond has a current medication list which includes the following prescription(s): alendronate, artificial tears, calcium carbonate-vit d-min, vitamin d3, premarin, meloxicam, and pantoprazole. Her primarily concern today is the Hip Pain (runs toward outside of the keft leg.)  Initial Vital Signs:  Pulse/HCG Rate: 67ECG Heart Rate: 71 Temp: 98 F (36.7 C) Resp: 16 BP: 136/88 SpO2: 100 %  BMI: Estimated body mass index is 23.4 kg/m as calculated from the following:   Height as of this encounter: 5\' 6"  (1.676 m).   Weight as of this encounter: 145 lb (65.8 kg).  Risk Assessment: Allergies: Reviewed. She is allergic to lodine [etodolac].  Allergy Precautions: None required Coagulopathies: Reviewed. None identified.  Blood-thinner therapy: None at this time Active Infection(s): Reviewed. None identified. Valerie Raymond is afebrile  Site Confirmation: Valerie Raymond was asked to confirm the procedure and laterality before marking the site Procedure checklist:  Completed Consent: Before the procedure and under the influence of no sedative(s), amnesic(s), or anxiolytics, the patient was informed of the treatment options, risks and possible complications. To  fulfill our ethical and legal obligations, as recommended by the American Medical Association's Code of Ethics, I have informed the patient of my clinical impression; the nature and purpose of the treatment or procedure; the risks, benefits, and possible complications of the intervention; the alternatives, including doing nothing; the risk(s) and benefit(s) of the alternative treatment(s) or procedure(s); and the risk(s) and benefit(s) of doing nothing. The patient was provided information about the general risks and possible complications associated with the procedure. These may include, but are not limited to: failure to achieve desired goals, infection, bleeding, organ or nerve damage, allergic reactions, paralysis, and death. In addition, the patient was informed of those risks and complications associated to the procedure, such as failure to decrease pain; infection; bleeding; organ or nerve damage with subsequent damage to sensory, motor, and/or autonomic systems, resulting in permanent pain, numbness, and/or weakness of one or several areas of the body; allergic reactions; (i.e.: anaphylactic reaction); and/or death. Furthermore, the patient was informed of those risks and complications associated with the medications. These include, but are not limited to: allergic reactions (i.e.: anaphylactic or anaphylactoid reaction(s)); adrenal axis suppression; blood sugar elevation that in diabetics may result in ketoacidosis or comma; water retention that in patients with history of congestive heart failure may result in shortness of breath, pulmonary edema, and decompensation with resultant heart failure; weight gain; swelling or edema; medication-induced neural toxicity; particulate matter embolism and blood vessel  occlusion with resultant organ, and/or nervous system infarction; and/or aseptic necrosis of one or more joints. Finally, the patient was informed that Medicine is not an exact science; therefore, there is also the possibility of unforeseen or unpredictable risks and/or possible complications that may result in a catastrophic outcome. The patient indicated having understood very clearly. We have given the patient no guarantees and we have made no promises. Enough time was given to the patient to ask questions, all of which were answered to the patient's satisfaction. Valerie Raymond has indicated that she wanted to continue with the procedure. Attestation: I, the ordering provider, attest that I have discussed with the patient the benefits, risks, side-effects, alternatives, likelihood of achieving goals, and potential problems during recovery for the procedure that I have provided informed consent. Date  Time: 03/23/2020  7:57 AM  Pre-Procedure Preparation:  Monitoring: As per clinic protocol. Respiration, ETCO2, SpO2, BP, heart rate and rhythm monitor placed and checked for adequate function Safety Precautions: Patient was assessed for positional comfort and pressure points before starting the procedure. Time-out: I initiated and conducted the "Time-out" before starting the procedure, as per protocol. The patient was asked to participate by confirming the accuracy of the "Time Out" information. Verification of the correct person, site, and procedure were performed and confirmed by me, the nursing staff, and the patient. "Time-out" conducted as per Joint Commission's Universal Protocol (UP.01.01.01). Time: 0823  Description of Procedure #1:  Safety Precautions: Aspiration looking for blood return was conducted prior to all injections. At no point did we inject any substances, as a needle was being advanced. No attempts were made at seeking any paresthesias. Safe injection practices and needle disposal  techniques used. Medications properly checked for expiration dates. SDV (single dose vial) medications used. Description of the Procedure: Protocol guidelines were followed. The patient was placed in position over the fluoroscopy table. The target area was identified and the area prepped in the usual manner. Skin & deeper tissues infiltrated with local anesthetic. Appropriate amount of time allowed to pass for local anesthetics  to take effect. The procedure needles were then advanced to the target area. Proper needle placement secured. Negative aspiration confirmed. Solution injected in intermittent fashion, asking for systemic symptoms every 0.5cc of injectate. The needles were then removed and the area cleansed, making sure to leave some of the prepping solution back to take advantage of its long term bactericidal properties.  Start Time: 0823 hrs. Materials:  Needle(s) Type: Spinal Needle Gauge: 22G Length: 5.0-in Medication(s): Please see orders for medications and dosing details.  Imaging Guidance (Non-Spinal):          Type of Imaging Technique: Fluoroscopy Guidance (Non-Spinal) Indication(s): Assistance in needle guidance and placement for procedures requiring needle placement in or near specific anatomical locations not easily accessible without such assistance. Exposure Time: Please see nurses notes. Contrast: Before injecting any contrast, we confirmed that the patient did not have an allergy to iodine, shellfish, or radiological contrast. Once satisfactory needle placement was completed at the desired level, radiological contrast was injected. Contrast injected under live fluoroscopy. No contrast complications. See chart for type and volume of contrast used. Fluoroscopic Guidance: I was personally present during the use of fluoroscopy. "Tunnel Vision Technique" used to obtain the best possible view of the target area. Parallax error corrected before commencing the procedure.  "Direction-depth-direction" technique used to introduce the needle under continuous pulsed fluoroscopy. Once target was reached, antero-posterior, oblique, and lateral fluoroscopic projection used confirm needle placement in all planes. Images permanently stored in EMR. Interpretation: I personally interpreted the imaging intraoperatively. Adequate needle placement confirmed in multiple planes. Appropriate spread of contrast into desired area was observed. No evidence of afferent or efferent intravascular uptake. Permanent images saved into the patient's record.   Description of Procedure #2:  Description of the Procedure: Skin & deeper tissues infiltrated with local anesthetic. Appropriate amount of time allowed to pass for local anesthetics to take effect. The procedure needles were then advanced to the target area. Proper needle placement secured. Negative aspiration confirmed. Solution injected in intermittent fashion, asking for systemic symptoms every 0.5cc of injectate. The needles were then removed and the area cleansed, making sure to leave some of the prepping solution back to take advantage of its long term bactericidal properties.  Vitals:   03/23/20 0756 03/23/20 0818 03/23/20 0823 03/23/20 0828  BP: 136/88 136/72 138/86 139/79  Pulse: 67     Resp: 16 20 19  (!) 22  Temp: 98 F (36.7 C)     TempSrc: Temporal     SpO2: 100% 99% 99% 99%  Weight: 145 lb (65.8 kg)     Height: 5\' 6"  (1.676 m)        End Time: 0828 hrs.  Materials:  Needle(s) Type: Spinal Needle Gauge: 22G Length: 5.0-in Medication(s): Please see orders for medications and dosing details.  Imaging Guidance (Non-Spinal):          Type of Imaging Technique: Fluoroscopy Guidance (Non-Spinal) Indication(s): Assistance in needle guidance and placement for procedures requiring needle placement in or near specific anatomical locations not easily accessible without such assistance. Exposure Time: Please see nurses  notes. Contrast: Before injecting any contrast, we confirmed that the patient did not have an allergy to iodine, shellfish, or radiological contrast. Once satisfactory needle placement was completed at the desired level, radiological contrast was injected. Contrast injected under live fluoroscopy. No contrast complications. See chart for type and volume of contrast used. Fluoroscopic Guidance: I was personally present during the use of fluoroscopy. "Tunnel Vision Technique" used to obtain the  best possible view of the target area. Parallax error corrected before commencing the procedure. "Direction-depth-direction" technique used to introduce the needle under continuous pulsed fluoroscopy. Once target was reached, antero-posterior, oblique, and lateral fluoroscopic projection used confirm needle placement in all planes. Images permanently stored in EMR. Interpretation: I personally interpreted the imaging intraoperatively. Adequate needle placement confirmed in multiple planes. Appropriate spread of contrast into desired area was observed. No evidence of afferent or efferent intravascular uptake. Permanent images saved into the patient's record.  Antibiotic Prophylaxis:   Anti-infectives (From admission, onward)   None     Indication(s): None identified  Post-operative Assessment:  Post-procedure Vital Signs:  Pulse/HCG Rate: 6777 Temp: 98 F (36.7 C) Resp: (!) 22 BP: 139/79 SpO2: 99 %  EBL: None  Complications: No immediate post-treatment complications observed by team, or reported by patient.  Note: The patient tolerated the entire procedure well. A repeat set of vitals were taken after the procedure and the patient was kept under observation following institutional policy, for this type of procedure. Post-procedural neurological assessment was performed, showing return to baseline, prior to discharge. The patient was provided with post-procedure discharge instructions, including a  section on how to identify potential problems. Should any problems arise concerning this procedure, the patient was given instructions to immediately contact us, at any time, without hesitation. In any case, we plan to contact the patient by telephone for a follow-up status report regarding this interventional procedure.  Comments:  No additional relevant information.  Plan of Care  Orders:  Orders Placed This Encounter  Procedures  . HIP INJECTION    Scheduling Instructions:     Side: Left-sided     Sedation: Patient's choice.     Timeframe: Today  . DG PA[facility] C-ARM 1-60 MIN NO REPORT    Intraoperative interpretation by procedural physician at [place].    Standing Status:   Standing    Number of Occurrences:   1    Order Specific Question:   Reason for exam:    Answer:   Assistance in needle guidance and placement for procedures requiring needle placement in or near specific anatomical locations not easily accessible without such assistance.  . Informed Consent Details: Physician/Practitioner Attestation; Transcribe to consent form and obtain patient signature    Nursing Order: Transcribe to consent form and obtain patient signature. Note: Always confirm laterality of pain with Ms. Wach, before procedure. Procedure: Hip injection Indication/Reason: Hip Joint Pain (Arthralgia) Provider Attestation: I, [place] Dossie Arbour, MD, (Pain Management Specialist), the physician/practitioner, attest that I have discussed with the patient the benefits, risks, side effects, alternatives, likelihood of achieving goals and potential problems during recovery for the procedure that I have provided informed consent.  . Provide equipment / supplies at bedside    Equipment required: Single use, disposable, "Block Tray"    Standing Status:   Standing    Number of Occurrences:   1    Order Specific Question:   Specify    Answer:   Block Tray   Chronic Opioid Analgesic:  No opioid  analgesics prescribed by our practice.   Medications ordered for procedure: Meds ordered this encounter  Medications  . iohexol (OMNIPAQUE) 180 MG/ML injection 10 mL    Must be Myelogram-compatible. If not available, you may substitute with a water-soluble, non-ionic, hypoallergenic, myelogram-compatible radiological contrast medium.  Marland Kitchen lidocaine (XYLOCAINE) 2 % (with pres) injection 400 mg  . methylPREDNISolone acetate (DEPO-MEDROL) injection 80 mg  . ropivacaine (PF) 2 mg/mL (  0.2%) (NAROPIN) injection 9 mL   Medications administered: We administered iohexol, lidocaine, methylPREDNISolone acetate, and ropivacaine (PF) 2 mg/mL (0.2%).  See the medical record for exact dosing, route, and time of administration.  Follow-up plan:   Return in about 2 weeks (around 04/06/2020) for F2F(20-min), (PP), pm on procedure day.       Interventional management options: Planned, scheduled, and/or pending:      Considering:   Diagnostic left IA hip joint injection #2  Diagnostic left trochanteric bursa injection #2    Palliative PRN treatment(s):   Diagnostic/therapeutic bilateral lumbar facet block #3 (100/90/50) Palliative left IA hip joint injection #2 (100/100/0) Palliative left trochanteric bursa injection #2 (100/50/50)  Palliative left lumbar facet RFA #2 (last done 12/23/2019) (100/0/90% for the top of the leg and 50% for the lateral aspect of the leg) Palliative right lumbar facet RFA #2 (last done 02/01/2020) (100/100/55)      Recent Visits Date Type Provider Dept  03/15/20 Telemedicine Milinda Pointer, MD [facility]  02/01/20 Procedure visit Milinda Pointer, MD [facility]  Showing recent visits within past 90 days and meeting all other requirements Today's Visits Date Type Provider Dept  03/23/20 Procedure visit Milinda Pointer, MD [facility]  Showing today's visits and meeting all other requirements Future Appointments Date Type Provider  Dept  04/11/20 Appointment Milinda Pointer, MD [facility]  Showing future appointments within next 90 days and meeting all other requirements  Disposition: Discharge home  Discharge (Date  Time): 03/23/2020; 0834 hrs.   Primary Care Physician: [name], MD Location: [facility] Note by: Gaspar Cola, MD Date: 03/23/2020; Time: 9:30 AM  Disclaimer:  Medicine is not an Chief Strategy Officer. The only guarantee in medicine is that nothing is guaranteed. It is important to note that the decision to proceed with this intervention was based on the information collected from the patient. The Data and conclusions were drawn from the patient's questionnaire, the interview, and the physical examination. Because the information was provided in large part by the patient, it cannot be guaranteed that it has not been purposely or unconsciously manipulated. Every effort has been made to obtain as much relevant data as possible for this evaluation. It is important to note that the conclusions that lead to this procedure are derived in large part from the available data. Always take into account that the treatment will also be dependent on availability of resources and existing treatment guidelines, considered by other Pain Management Practitioners as being common knowledge and practice, at the time of the intervention. For Medico-Legal purposes, it is also important to point out that variation in procedural techniques and pharmacological choices are the acceptable norm. The indications, contraindications, technique, and results of the above procedure should only be interpreted and judged by a Board-Certified Interventional Pain Specialist with extensive familiarity and expertise in the same exact procedure and technique.

## 2020-03-22 NOTE — Patient Instructions (Signed)

## 2020-03-23 ENCOUNTER — Ambulatory Visit
Admission: RE | Admit: 2020-03-23 | Discharge: 2020-03-23 | Disposition: A | Discharge status: Home / Self Care | Payer: Medicare Other | Source: Ambulatory Visit | Attending: Pain Medicine | Admitting: Pain Medicine

## 2020-03-23 ENCOUNTER — Encounter: Payer: Self-pay | Admitting: Pain Medicine

## 2020-03-23 ENCOUNTER — Other Ambulatory Visit: Payer: Self-pay

## 2020-03-23 ENCOUNTER — Ambulatory Visit (HOSPITAL_BASED_OUTPATIENT_CLINIC_OR_DEPARTMENT_OTHER): Payer: Medicare Other | Admitting: Pain Medicine

## 2020-03-23 VITALS — BP 139/79 | HR 67 | Temp 98.0°F | Resp 22 | Ht 66.0 in | Wt 145.0 lb

## 2020-03-23 DIAGNOSIS — R208 Other disturbances of skin sensation: Secondary | ICD-10-CM

## 2020-03-23 DIAGNOSIS — M7062 Trochanteric bursitis, left hip: Secondary | ICD-10-CM | POA: Insufficient documentation

## 2020-03-23 DIAGNOSIS — M79652 Pain in left thigh: Secondary | ICD-10-CM

## 2020-03-23 DIAGNOSIS — G8929 Other chronic pain: Secondary | ICD-10-CM | POA: Insufficient documentation

## 2020-03-23 DIAGNOSIS — M1612 Unilateral primary osteoarthritis, left hip: Secondary | ICD-10-CM | POA: Diagnosis not present

## 2020-03-23 DIAGNOSIS — M7061 Trochanteric bursitis, right hip: Secondary | ICD-10-CM | POA: Insufficient documentation

## 2020-03-23 DIAGNOSIS — M25552 Pain in left hip: Secondary | ICD-10-CM | POA: Diagnosis not present

## 2020-03-23 MED ORDER — IOHEXOL 180 MG/ML  SOLN
10.0000 mL | Freq: Once | INTRAMUSCULAR | Status: AC
  Administered 2020-03-23: 10 mL via EPIDURAL
  Filled 2020-03-23: qty 20

## 2020-03-23 MED ORDER — LIDOCAINE HCL 2 % IJ SOLN
20.0000 mL | Freq: Once | INTRAMUSCULAR | Status: AC
  Administered 2020-03-23: 400 mg
  Filled 2020-03-23: qty 20

## 2020-03-23 MED ORDER — ROPIVACAINE HCL 2 MG/ML IJ SOLN
9.0000 mL | Freq: Once | INTRAMUSCULAR | Status: AC
  Administered 2020-03-23: 9 mL via INTRA_ARTICULAR
  Filled 2020-03-23: qty 10

## 2020-03-23 MED ORDER — METHYLPREDNISOLONE ACETATE 80 MG/ML IJ SUSP
80.0000 mg | Freq: Once | INTRAMUSCULAR | Status: AC
  Administered 2020-03-23: 80 mg via INTRA_ARTICULAR
  Filled 2020-03-23: qty 1

## 2020-03-23 NOTE — Progress Notes (Signed)
Safety precautions to be maintained throughout the outpatient stay will include: orient to surroundings, keep bed in low position, maintain call bell within reach at all times, provide assistance with transfer out of bed and ambulation.  

## 2020-03-24 ENCOUNTER — Telehealth: Payer: Self-pay

## 2020-03-24 NOTE — Telephone Encounter (Signed)
Post procedure phone call.  LM 

## 2020-03-27 ENCOUNTER — Telehealth: Payer: Self-pay | Admitting: Internal Medicine

## 2020-03-27 NOTE — Telephone Encounter (Signed)
LM TO C/B AND RESCH AWV APPT. KASE[facility] OUT OF OFFICE

## 2020-04-03 ENCOUNTER — Ambulatory Visit: Payer: Medicare Other

## 2020-04-05 NOTE — Progress Notes (Signed)
Date:  04/06/2020   Name:  Valerie Raymond   DOB:  [birth year]   MRN:  [id number]   Chief Complaint: Annual Exam (Breast Exam. No pap- aged out.)  Valerie Raymond is a 69 y.o. female who presents today for her Complete Annual Exam. She feels well. She reports exercising goes to the gym 3 times a week . She reports she is sleeping fairly well. Breast complaints none.  Mammogram: scheduled DEXA: 03/2018 - osteopenia  Pap smear: discontinued Colonoscopy: 01/2015  Immunization History  Administered Date(s) Administered  . Fluad Quad(high Dose 65+) 05/20/2019  . Influenza-Unspecified 05/23/2015, 06/01/2018  . PFIZER SARS-COV-2 Vaccination 11/11/2019, 12/07/2019  . Pneumococcal Conjugate-13 03/17/2017  . Pneumococcal Polysaccharide-23 03/23/2018  . Tdap 03/13/2016  . Zoster 08/19/2011  . Zoster Recombinat (Shingrix) 08/31/2018, 11/18/2018     Back Pain This is a chronic problem. The problem has been gradually improving since onset. The pain is present in the lumbar spine. Pertinent negatives include no abdominal pain, chest pain, dysuria, fever or headaches. She has tried NSAIDs for the symptoms. The treatment provided moderate relief.  Gastroesophageal Reflux She complains of dysphagia (hx of esoph dilatation). She reports no abdominal pain, no chest pain, no coughing, no heartburn or no wheezing. Pertinent negatives include no fatigue.    Lab Results  Component Value Date   CREATININE 0.87 08/04/2019   BUN 10 08/04/2019   NA 142 08/04/2019   K 4.7 08/04/2019   CL 105 08/04/2019   CO2 22 04/06/2019   Lab Results  Component Value Date   CHOL 179 04/06/2019   HDL 61 04/06/2019   LDLCALC 99 04/06/2019   TRIG 94 04/06/2019   CHOLHDL 2.9 04/06/2019   Lab Results  Component Value Date   TSH 2.260 04/06/2019   No results found for: HGBA1C Lab Results  Component Value Date   WBC 5.3 04/06/2019   HGB 13.6 04/06/2019   HCT 41.1 04/06/2019   MCV 87 04/06/2019   PLT 294  04/06/2019   Lab Results  Component Value Date   ALT 20 04/06/2019   AST 24 08/04/2019   ALKPHOS 78 08/04/2019   BILITOT 0.3 08/04/2019     Review of Systems  Constitutional: Negative for chills, fatigue and fever.  HENT: Negative for congestion, hearing loss, tinnitus, trouble swallowing and voice change.   Eyes: Negative for visual disturbance.  Respiratory: Negative for cough, chest tightness, shortness of breath and wheezing.   Cardiovascular: Negative for chest pain, palpitations and leg swelling.  Gastrointestinal: Positive for dysphagia (hx of esoph dilatation). Negative for abdominal pain, constipation, diarrhea, heartburn and vomiting.  Endocrine: Negative for polydipsia and polyuria.  Genitourinary: Negative for dysuria, frequency, genital sores, vaginal bleeding and vaginal discharge.  Musculoskeletal: Positive for arthralgias and back pain. Negative for gait problem and joint swelling.  Skin: Negative for color change and rash.  Neurological: Negative for dizziness, tremors, light-headedness and headaches.  Hematological: Negative for adenopathy. Does not bruise/bleed easily.  Psychiatric/Behavioral: Negative for dysphoric mood and sleep disturbance. The patient is not nervous/anxious.     Patient Active Problem List   Diagnosis Date Noted  . Osteoarthritis of hip (Left) 03/22/2020  . Spondylosis without myelopathy or radiculopathy, lumbosacral region 09/28/2019  . Trochanteric bursitis of hips (Bilateral) (L>R) 08/17/2019  . Chronic thigh pain (1ry area of Pain) (Left) 08/04/2019  . Lower extremity burning sensation (thigh) (Left) 08/04/2019  . Greater trochanteric bursitis of hip (Left) 08/04/2019  . Lumbar facet syndrome (Bilateral) (L>R)  08/04/2019  . Chronic pain syndrome 08/03/2019  . Pharmacologic therapy 08/03/2019  . Disorder of skeletal system 08/03/2019  . Problems influencing health status 08/03/2019  . Abnormal MRI, lumbar spine (06/29/2019) 08/03/2019   . Grade 1 Lumbar Retrolisthesis L3/L4 08/03/2019  . DDD (degenerative disc disease), lumbosacral 08/03/2019  . Lumbosacral intervertebral disc displacement (IVDD) 08/03/2019  . Lumbar facet arthropathy (Multilevel) (Bilateral) 08/03/2019  . Lumbosacral foraminal stenosis (Bilateral) (L5-S1) 08/03/2019  . Lumbar lateral recess stenosis (Left) (L2-3) 08/03/2019  . Chronic lower extremity pain (3ry area of Pain) (Bilateral) (L>R) 08/03/2019  . Chronic low back pain (2ry area of Pain) (Bilateral) (L>R) w/o sciatica 08/03/2019  . Chronic foot pain (Right) 08/03/2019  . Chronic lumbar radiculitis 08/03/2019  . Chronic musculoskeletal pain 08/03/2019  . Hyperlipidemia, mild 05/22/2018  . Pessary maintenance 04/09/2018  . Lumbar radiculopathy 07/16/2017  . Stricture and stenosis of esophagus   . Lumbar pain with radiation down legs (Bilateral) 06/05/2017  . Dysphagia 03/21/2017  . Tonsillith 10/09/2016  . Family history of breast cancer in first degree relative 01/02/2016  . Status post total abdominal hysterectomy and bilateral salpingo-oophorectomy (TAH-BSO) 01/02/2016  . Vaginal atrophy 01/02/2016  . Incomplete bladder emptying 01/02/2016  . Midline cystocele 01/02/2016  . Colon, diverticulosis 07/23/2015  . Chronic hip pain (Bilateral) (L>R) 07/23/2015  . Arthritis of knee, degenerative 07/23/2015  . Other osteoporosis without current pathological fracture 07/23/2015  . Avitaminosis D 07/23/2015    Allergies  Allergen Reactions  . Lodine [Etodolac] Diarrhea and Nausea And Vomiting    Past Surgical History:  Procedure Laterality Date  . ABDOMINAL HYSTERECTOMY  1992  . COLONOSCOPY  2006  . COLONOSCOPY N/A 01/23/2015   Procedure: COLONOSCOPY;  Surgeon: Lucilla Lame, MD;  Location: [place];  Service: Gastroenterology;  Laterality: N/A;  cecum- [id number]  . ESOPHAGEAL DILATION  04/21/2017   Procedure: ESOPHAGEAL DILATION;  Surgeon: Lucilla Lame, MD;  Location: [place];  Service: Endoscopy;;  . ESOPHAGEAL DILATION  07/07/2017   Procedure: ESOPHAGEAL DILATION;  Surgeon: Lucilla Lame, MD;  Location: [place];  Service: Endoscopy;;  . ESOPHAGEAL DILATION N/A 07/28/2017   Procedure: ESOPHAGEAL DILATION;  Surgeon: Lucilla Lame, MD;  Location: [place];  Service: Endoscopy;  Laterality: N/A;  . ESOPHAGOGASTRODUODENOSCOPY N/A 04/21/2017   Procedure: ESOPHAGOGASTRODUODENOSCOPY (EGD);  Surgeon: Lucilla Lame, MD;  Location: [place];  Service: Endoscopy;  Laterality: N/A;  . ESOPHAGOGASTRODUODENOSCOPY (EGD) WITH PROPOFOL N/A 07/07/2017   Procedure: ESOPHAGOGASTRODUODENOSCOPY (EGD) WITH PROPOFOL;  Surgeon: Lucilla Lame, MD;  Location: [place];  Service: Endoscopy;  Laterality: N/A;  . ESOPHAGOGASTRODUODENOSCOPY (EGD) WITH PROPOFOL N/A 07/28/2017   Procedure: ESOPHAGOGASTRODUODENOSCOPY (EGD) WITH PROPOFOL;  Surgeon: Lucilla Lame, MD;  Location: [place];  Service: Endoscopy;  Laterality: N/A;  . SALPINGOOPHORECTOMY Bilateral     Social History   Tobacco Use  . Smoking status: Never Smoker  . Smokeless tobacco: Never Used  . Tobacco comment: smoking cessation materials not required  Vaping Use  . Vaping Use: Never used  Substance Use Topics  . Alcohol use: No    Alcohol/week: 0.0 standard drinks  . Drug use: No     Medication list has been reviewed and updated.  Current Meds  Medication Sig  . alendronate (FOSAMAX) 70 MG tablet TAKE 1 TABLET BY MOUTH ONCE A WEEK WITH A GLASS OF WATER ON AN EMPTY STOMACH AND REMAIN UPRIGHT WITHOUT EATING FOR 30 MINUTES  . ARTIFICIAL TEARS 1 % ophthalmic solution   .  Calcium Carbonate-Vit D-Min (CALCIUM 1200 PO) Take by mouth.  . Cholecalciferol (VITAMIN D3) 1000 UNITS CAPS Take by mouth. am  . conjugated estrogens (PREMARIN) vaginal cream Place vaginally 2 (two) times a week. Use 1-2 times per week.  . meloxicam (MOBIC) 15 MG tablet TAKE 1 TABLET(15 MG) BY MOUTH  DAILY (Patient taking differently: as needed. )  . pantoprazole (PROTONIX) 40 MG tablet TAKE 1 TABLET(40 MG) BY MOUTH DAILY IN THE MORNING    [facility] 2/9 Scores 04/06/2020 03/23/2020 12/23/2019 10/21/2019  PHQ - 2 Score 0 0 0 0  PHQ- 9 Score 0 - - -  Exception Documentation - Patient refusal - -    GAD 7 : Generalized Anxiety Score 04/06/2020  Nervous, Anxious, on Edge 0  Control/stop worrying 0  Worry too much - different things 0  Trouble relaxing 0  Restless 0  Easily annoyed or irritable 0  Afraid - awful might happen 0  Total GAD 7 Score 0  Anxiety Difficulty Not difficult at all    BP Readings from Last 3 Encounters:  04/06/20 (!) 138/92  03/23/20 139/79  02/01/20 (!) 145/76    Physical Exam Vitals and nursing note reviewed.  Constitutional:      General: She is not in acute distress.    Appearance: She is well-developed.  HENT:     Head: Normocephalic and atraumatic.     Right Ear: Tympanic membrane and ear canal normal.     Left Ear: Tympanic membrane and ear canal normal.     Nose:     Right Sinus: No maxillary sinus tenderness.     Left Sinus: No maxillary sinus tenderness.  Eyes:     General: No scleral icterus.       Right eye: No discharge.        Left eye: No discharge.     Conjunctiva/sclera: Conjunctivae normal.  Neck:     Thyroid: No thyromegaly.     Vascular: No carotid bruit.  Cardiovascular:     Rate and Rhythm: Normal rate and regular rhythm.     Pulses: Normal pulses.     Heart sounds: Normal heart sounds.  Pulmonary:     Effort: Pulmonary effort is normal. No respiratory distress.     Breath sounds: No wheezing.  Chest:     Breasts:        Right: No mass, nipple discharge, skin change or tenderness.        Left: No mass, nipple discharge, skin change or tenderness.  Abdominal:     General: Bowel sounds are normal.     Palpations: Abdomen is soft.     Tenderness: There is no abdominal tenderness.  Musculoskeletal:     Cervical back: Normal  range of motion. No erythema.     Right hip: Normal range of motion.     Left hip: Decreased range of motion.     Right knee: Normal.     Left knee: Normal.     Right lower leg: No edema.     Left lower leg: No edema.  Lymphadenopathy:     Cervical: No cervical adenopathy.  Skin:    General: Skin is warm and dry.     Findings: No rash.  Neurological:     Mental Status: She is alert and oriented to person, place, and time.     Cranial Nerves: No cranial nerve deficit.     Sensory: No sensory deficit.     Deep Tendon Reflexes: Reflexes are normal  and symmetric.  Psychiatric:        Attention and Perception: Attention normal.        Mood and Affect: Mood normal.     Wt Readings from Last 3 Encounters:  04/06/20 145 lb (65.8 kg)  03/23/20 145 lb (65.8 kg)  02/01/20 144 lb (65.3 kg)    BP (!) 138/92   Pulse 66   Temp 98.3 F (36.8 C) (Oral)   Ht 5\' 6"  (1.676 m)   Wt 145 lb (65.8 kg)   SpO2 98%   BMI 23.40 kg/m   Assessment and Plan: 1. Annual physical exam Normal exam Continue exercise as able - POCT urinalysis dipstick - TSH  2. Encounter for screening mammogram for breast cancer Scheduled for next week Breast exam benign  3. Elevated BP without diagnosis of hypertension Begin DASH diet; limit high sodium foods Measure and record BP at home three times per week Return in 1 month to review and consider medication if indicated  4. Stricture and stenosis of esophagus No recurrent dysphagia or gerd Continue daily PPI - CBC with Differential/Platelet  5. Lumbar radiculopathy Chronic, followed by pain medicine  6. Other osteoporosis without current pathological fracture On Actonel without jaw pain or other concerns Continue calcium and vitamin D daily  7. Avitaminosis D - VITAMIN D 25 Hydroxy (Vit-D Deficiency, Fractures)  8. Hyperlipidemia, mild Check labs and advise - Comprehensive metabolic panel - Lipid panel   Partially dictated using Dragon  software. Any errors are unintentional.  Halina Maidens, MD [facility]  04/06/2020

## 2020-04-06 ENCOUNTER — Ambulatory Visit (INDEPENDENT_AMBULATORY_CARE_PROVIDER_SITE_OTHER): Payer: Medicare Other | Admitting: Internal Medicine

## 2020-04-06 ENCOUNTER — Encounter: Payer: Self-pay | Admitting: Internal Medicine

## 2020-04-06 ENCOUNTER — Other Ambulatory Visit: Payer: Self-pay

## 2020-04-06 VITALS — BP 138/92 | HR 66 | Temp 98.3°F | Ht 66.0 in | Wt 145.0 lb

## 2020-04-06 DIAGNOSIS — M818 Other osteoporosis without current pathological fracture: Secondary | ICD-10-CM | POA: Diagnosis not present

## 2020-04-06 DIAGNOSIS — E785 Hyperlipidemia, unspecified: Secondary | ICD-10-CM | POA: Diagnosis not present

## 2020-04-06 DIAGNOSIS — R030 Elevated blood-pressure reading, without diagnosis of hypertension: Secondary | ICD-10-CM | POA: Diagnosis not present

## 2020-04-06 DIAGNOSIS — M5416 Radiculopathy, lumbar region: Secondary | ICD-10-CM

## 2020-04-06 DIAGNOSIS — K222 Esophageal obstruction: Secondary | ICD-10-CM | POA: Diagnosis not present

## 2020-04-06 DIAGNOSIS — Z0000 Encounter for general adult medical examination without abnormal findings: Secondary | ICD-10-CM

## 2020-04-06 DIAGNOSIS — Z1231 Encounter for screening mammogram for malignant neoplasm of breast: Secondary | ICD-10-CM

## 2020-04-06 DIAGNOSIS — E559 Vitamin D deficiency, unspecified: Secondary | ICD-10-CM

## 2020-04-06 LAB — POCT URINALYSIS DIPSTICK
Bilirubin, UA: NEGATIVE
Blood, UA: NEGATIVE
Glucose, UA: NEGATIVE
Ketones, UA: NEGATIVE
Leukocytes, UA: NEGATIVE
Nitrite, UA: NEGATIVE
Protein, UA: NEGATIVE
Spec Grav, UA: 1.01 (ref 1.010–1.025)
Urobilinogen, UA: 0.2 E.U./dL
pH, UA: 5 (ref 5.0–8.0)

## 2020-04-06 NOTE — Patient Instructions (Addendum)
Record BP several times a week for the new 6-8 weeks.  Return for follow up with your cuff and readings so we can compare.  DASH Eating Plan DASH stands for "Dietary Approaches to Stop Hypertension." The DASH eating plan is a healthy eating plan that has been shown to reduce high blood pressure (hypertension). It may also reduce your risk for type 2 diabetes, heart disease, and stroke. The DASH eating plan may also help with weight loss. What are tips for following this plan?  General guidelines  Avoid eating more than 2,300 mg (milligrams) of salt (sodium) a day. If you have hypertension, you may need to reduce your sodium intake to 1,500 mg a day.  Limit alcohol intake to no more than 1 drink a day for nonpregnant women and 2 drinks a day for men. One drink equals 12 oz of beer, 5 oz of wine, or 1 oz of hard liquor.  Work with your health care provider to maintain a healthy body weight or to lose weight. Ask what an ideal weight is for you.  Get at least 30 minutes of exercise that causes your heart to beat faster (aerobic exercise) most days of the week. Activities may include walking, swimming, or biking.  Work with your health care provider or diet and nutrition specialist (dietitian) to adjust your eating plan to your individual calorie needs. Reading food labels   Check food labels for the amount of sodium per serving. Choose foods with less than 5 percent of the Daily Value of sodium. Generally, foods with less than 300 mg of sodium per serving fit into this eating plan.  To find whole grains, look for the word "whole" as the first word in the ingredient list. Shopping  Buy products labeled as "low-sodium" or "no salt added."  Buy fresh foods. Avoid canned foods and premade or frozen meals. Cooking  Avoid adding salt when cooking. Use salt-free seasonings or herbs instead of table salt or sea salt. Check with your health care provider or pharmacist before using salt  substitutes.  Do not fry foods. Cook foods using healthy methods such as baking, boiling, grilling, and broiling instead.  Cook with heart-healthy oils, such as olive, canola, soybean, or sunflower oil. Meal planning  Eat a balanced diet that includes: ? 5 or more servings of fruits and vegetables each day. At each meal, try to fill half of your plate with fruits and vegetables. ? Up to 6-8 servings of whole grains each day. ? Less than 6 oz of lean meat, poultry, or fish each day. A 3-oz serving of meat is about the same size as a deck of cards. One egg equals 1 oz. ? 2 servings of low-fat dairy each day. ? A serving of nuts, seeds, or beans 5 times each week. ? Heart-healthy fats. Healthy fats called Omega-3 fatty acids are found in foods such as flaxseeds and coldwater fish, like sardines, salmon, and mackerel.  Limit how much you eat of the following: ? Canned or prepackaged foods. ? Food that is high in trans fat, such as fried foods. ? Food that is high in saturated fat, such as fatty meat. ? Sweets, desserts, sugary drinks, and other foods with added sugar. ? Full-fat dairy products.  Do not salt foods before eating.  Try to eat at least 2 vegetarian meals each week.  Eat more home-cooked food and less restaurant, buffet, and fast food.  When eating at a restaurant, ask that your food be prepared  with less salt or no salt, if possible. What foods are recommended? The items listed may not be a complete list. Talk with your dietitian about what dietary choices are best for you. Grains Whole-grain or whole-wheat bread. Whole-grain or whole-wheat pasta. Brown rice. [place]. Bulgur. Whole-grain and low-sodium cereals. Pita bread. Low-fat, low-sodium crackers. Whole-wheat flour tortillas. Vegetables Fresh or frozen vegetables (raw, steamed, roasted, or grilled). Low-sodium or reduced-sodium tomato and vegetable juice. Low-sodium or reduced-sodium tomato sauce and tomato  paste. Low-sodium or reduced-sodium canned vegetables. Fruits All fresh, dried, or frozen fruit. Canned fruit in natural juice (without added sugar). Meat and other protein foods Skinless chicken or [place]. Ground chicken or [place]. Pork with fat trimmed off. Fish and seafood. Egg whites. Dried beans, peas, or lentils. Unsalted nuts, nut butters, and seeds. Unsalted canned beans. Lean cuts of beef with fat trimmed off. Low-sodium, lean deli meat. Dairy Low-fat (1%) or fat-free (skim) milk. Fat-free, low-fat, or reduced-fat cheeses. Nonfat, low-sodium ricotta or cottage cheese. Low-fat or nonfat yogurt. Low-fat, low-sodium cheese. Fats and oils Soft margarine without trans fats. Vegetable oil. Low-fat, reduced-fat, or light mayonnaise and salad dressings (reduced-sodium). Canola, safflower, olive, soybean, and sunflower oils. Avocado. Seasoning and other foods Herbs. Spices. Seasoning mixes without salt. Unsalted popcorn and pretzels. Fat-free sweets. What foods are not recommended? The items listed may not be a complete list. Talk with your dietitian about what dietary choices are best for you. Grains Baked goods made with fat, such as croissants, muffins, or some breads. Dry pasta or rice meal packs. Vegetables Creamed or fried vegetables. Vegetables in a cheese sauce. Regular canned vegetables (not low-sodium or reduced-sodium). Regular canned tomato sauce and paste (not low-sodium or reduced-sodium). Regular tomato and vegetable juice (not low-sodium or reduced-sodium). Angie Fava. Olives. Fruits Canned fruit in a light or heavy syrup. Fried fruit. Fruit in cream or butter sauce. Meat and other protein foods Fatty cuts of meat. Ribs. Fried meat. Berniece Salines. Sausage. Bologna and other processed lunch meats. Salami. Fatback. Hotdogs. Bratwurst. Salted nuts and seeds. Canned beans with added salt. Canned or smoked fish. Whole eggs or egg yolks. Chicken or [place] with skin. Dairy Whole or 2% milk,  cream, and half-and-half. Whole or full-fat cream cheese. Whole-fat or sweetened yogurt. Full-fat cheese. Nondairy creamers. Whipped toppings. Processed cheese and cheese spreads. Fats and oils Butter. Stick margarine. Lard. Shortening. Ghee. Bacon fat. Tropical oils, such as coconut, palm kernel, or palm oil. Seasoning and other foods Salted popcorn and pretzels. Onion salt, garlic salt, seasoned salt, table salt, and sea salt. Worcestershire sauce. Tartar sauce. Barbecue sauce. Teriyaki sauce. Soy sauce, including reduced-sodium. Steak sauce. Canned and packaged gravies. Fish sauce. Oyster sauce. Cocktail sauce. Horseradish that you find on the shelf. Ketchup. Mustard. Meat flavorings and tenderizers. Bouillon cubes. Hot sauce and Tabasco sauce. Premade or packaged marinades. Premade or packaged taco seasonings. Relishes. Regular salad dressings. Where to find more information:  National Heart, Lung, and [place]: https://wilson-eaton.com/  American Heart Association: www.heart.org Summary  The DASH eating plan is a healthy eating plan that has been shown to reduce high blood pressure (hypertension). It may also reduce your risk for type 2 diabetes, heart disease, and stroke.  With the DASH eating plan, you should limit salt (sodium) intake to 2,300 mg a day. If you have hypertension, you may need to reduce your sodium intake to 1,500 mg a day.  When on the DASH eating plan, aim to eat more fresh fruits and vegetables, whole grains, lean  proteins, low-fat dairy, and heart-healthy fats.  Work with your health care provider or diet and nutrition specialist (dietitian) to adjust your eating plan to your individual calorie needs. This information is not intended to replace advice given to you by your health care provider. Make sure you discuss any questions you have with your health care provider. Document Revised: 08/15/2017 Document Reviewed: 08/26/2016 Elsevier Patient Education  2020 [employer].

## 2020-04-07 LAB — CBC WITH DIFFERENTIAL/PLATELET
Basophils Absolute: 0.1 10*3/uL (ref 0.0–0.2)
Basos: 1 %
EOS (ABSOLUTE): 0.1 10*3/uL (ref 0.0–0.4)
Eos: 2 %
Hematocrit: 39.6 % (ref 34.0–46.6)
Hemoglobin: 13.6 g/dL (ref 11.1–15.9)
Immature Grans (Abs): 0 10*3/uL (ref 0.0–0.1)
Immature Granulocytes: 0 %
Lymphocytes Absolute: 0.8 10*3/uL (ref 0.7–3.1)
Lymphs: 9 %
MCH: 29.2 pg (ref 26.6–33.0)
MCHC: 34.3 g/dL (ref 31.5–35.7)
MCV: 85 fL (ref 79–97)
Monocytes Absolute: 0.6 10*3/uL (ref 0.1–0.9)
Monocytes: 8 %
Neutrophils Absolute: 6.7 10*3/uL (ref 1.4–7.0)
Neutrophils: 80 %
Platelets: 329 10*3/uL (ref 150–450)
RBC: 4.66 x10E6/uL (ref 3.77–5.28)
RDW: 12.9 % (ref 11.7–15.4)
WBC: 8.3 10*3/uL (ref 3.4–10.8)

## 2020-04-07 LAB — COMPREHENSIVE METABOLIC PANEL
ALT: 15 IU/L (ref 0–32)
AST: 22 IU/L (ref 0–40)
Albumin/Globulin Ratio: 1.9 (ref 1.2–2.2)
Albumin: 4.6 g/dL (ref 3.8–4.8)
Alkaline Phosphatase: 79 IU/L (ref 48–121)
BUN/Creatinine Ratio: 12 (ref 12–28)
BUN: 9 mg/dL (ref 8–27)
Bilirubin Total: 0.3 mg/dL (ref 0.0–1.2)
CO2: 23 mmol/L (ref 20–29)
Calcium: 10 mg/dL (ref 8.7–10.3)
Chloride: 103 mmol/L (ref 96–106)
Creatinine, Ser: 0.74 mg/dL (ref 0.57–1.00)
GFR calc Af Amer: 96 mL/min/{1.73_m2} (ref >59)
GFR calc non Af Amer: 84 mL/min/{1.73_m2} (ref >59)
Globulin, Total: 2.4 g/dL (ref 1.5–4.5)
Glucose: 84 mg/dL (ref 65–99)
Potassium: 4.6 mmol/L (ref 3.5–5.2)
Sodium: 141 mmol/L (ref 134–144)
Total Protein: 7 g/dL (ref 6.0–8.5)

## 2020-04-07 LAB — LIPID PANEL
Chol/HDL Ratio: 2.8 ratio (ref 0.0–4.4)
Cholesterol, Total: 213 mg/dL — ABNORMAL HIGH (ref 100–199)
HDL: 76 mg/dL (ref >39)
LDL Chol Calc (NIH): 123 mg/dL — ABNORMAL HIGH (ref 0–99)
Triglycerides: 81 mg/dL (ref 0–149)
VLDL Cholesterol Cal: 14 mg/dL (ref 5–40)

## 2020-04-07 LAB — TSH: TSH: 1.22 u[IU]/mL (ref 0.450–4.500)

## 2020-04-07 LAB — VITAMIN D 25 HYDROXY (VIT D DEFICIENCY, FRACTURES): Vit D, 25-Hydroxy: 20.8 ng/mL — ABNORMAL LOW (ref 30.0–100.0)

## 2020-04-10 ENCOUNTER — Ambulatory Visit
Admission: RE | Admit: 2020-04-10 | Discharge: 2020-04-10 | Disposition: A | Discharge status: Home / Self Care | Payer: Medicare Other | Source: Ambulatory Visit | Attending: Internal Medicine | Admitting: Internal Medicine

## 2020-04-10 ENCOUNTER — Other Ambulatory Visit: Payer: Self-pay

## 2020-04-10 DIAGNOSIS — Z1231 Encounter for screening mammogram for malignant neoplasm of breast: Secondary | ICD-10-CM | POA: Diagnosis not present

## 2020-04-11 ENCOUNTER — Encounter: Payer: Self-pay | Admitting: Pain Medicine

## 2020-04-11 ENCOUNTER — Ambulatory Visit: Payer: Medicare Other | Attending: Pain Medicine | Admitting: Pain Medicine

## 2020-04-11 ENCOUNTER — Telehealth: Payer: Self-pay | Admitting: Internal Medicine

## 2020-04-11 ENCOUNTER — Other Ambulatory Visit: Payer: Self-pay | Admitting: Internal Medicine

## 2020-04-11 ENCOUNTER — Other Ambulatory Visit: Payer: Self-pay

## 2020-04-11 VITALS — BP 162/92 | HR 94 | Temp 97.2°F | Ht 66.0 in | Wt 145.0 lb

## 2020-04-11 DIAGNOSIS — M79604 Pain in right leg: Secondary | ICD-10-CM | POA: Diagnosis not present

## 2020-04-11 DIAGNOSIS — M545 Low back pain, unspecified: Secondary | ICD-10-CM

## 2020-04-11 DIAGNOSIS — G894 Chronic pain syndrome: Secondary | ICD-10-CM

## 2020-04-11 DIAGNOSIS — M533 Sacrococcygeal disorders, not elsewhere classified: Secondary | ICD-10-CM

## 2020-04-11 DIAGNOSIS — M779 Enthesopathy, unspecified: Secondary | ICD-10-CM

## 2020-04-11 DIAGNOSIS — M79652 Pain in left thigh: Secondary | ICD-10-CM | POA: Diagnosis not present

## 2020-04-11 DIAGNOSIS — G8929 Other chronic pain: Secondary | ICD-10-CM | POA: Diagnosis not present

## 2020-04-11 DIAGNOSIS — E785 Hyperlipidemia, unspecified: Secondary | ICD-10-CM

## 2020-04-11 DIAGNOSIS — M79605 Pain in left leg: Secondary | ICD-10-CM

## 2020-04-11 DIAGNOSIS — M9904 Segmental and somatic dysfunction of sacral region: Secondary | ICD-10-CM

## 2020-04-11 MED ORDER — ATORVASTATIN CALCIUM 10 MG PO TABS: 10.0000 mg | ORAL_TABLET | Freq: Every day | ORAL | 3 refills | Status: DC

## 2020-04-11 NOTE — Telephone Encounter (Signed)
Not sure why that did not get done previously.  I just sent in Lipitor 10 mg per day.  Please follow up in 6 months for OV and labs.

## 2020-04-11 NOTE — Patient Instructions (Addendum)
____________________________________________________________________________________________  Preparing for your procedure (without sedation)  Procedure appointments are limited to planned procedures: . No Prescription Refills. . No disability issues will be discussed. . No medication changes will be discussed.  Instructions: . Oral Intake: Do not eat or drink anything for at least 6 hours prior to your procedure. (Exception: Blood Pressure Medication. See below.) . Transportation: Unless otherwise stated by your physician, you may drive yourself after the procedure. . Blood Pressure Medicine: Do not forget to take your blood pressure medicine with a sip of water the morning of the procedure. If your Diastolic (lower reading)is above 100 mmHg, elective cases will be cancelled/rescheduled. . Blood thinners: These will need to be stopped for procedures. Notify our staff if you are taking any blood thinners. Depending on which one you take, there will be specific instructions on how and when to stop it. . Diabetics on insulin: Notify the staff so that you can be scheduled 1st case in the morning. If your diabetes requires high dose insulin, take only  of your normal insulin dose the morning of the procedure and notify the staff that you have done so. . Preventing infections: Shower with an antibacterial soap the morning of your procedure.  . Build-up your immune system: Take 1000 mg of Vitamin C with every meal (3 times a day) the day prior to your procedure. . Antibiotics: Inform the staff if you have a condition or reason that requires you to take antibiotics before dental procedures. . Pregnancy: If you are pregnant, call and cancel the procedure. . Sickness: If you have a cold, fever, or any active infections, call and cancel the procedure. . Arrival: You must be in the facility at least 30 minutes prior to your scheduled procedure. . Children: Do not bring any children with you. . Dress  appropriately: Bring dark clothing that you would not mind if they get stained. . Valuables: Do not bring any jewelry or valuables.  Reasons to call and reschedule or cancel your procedure: (Following these recommendations will minimize the risk of a serious complication.) . Surgeries: Avoid having procedures within 2 weeks of any surgery. (Avoid for 2 weeks before or after any surgery). . Flu Shots: Avoid having procedures within 2 weeks of a flu shots or . (Avoid for 2 weeks before or after immunizations). . Barium: Avoid having a procedure within 7-10 days after having had a radiological study involving the use of radiological contrast. (Myelograms, Barium swallow or enema study). . Heart attacks: Avoid any elective procedures or surgeries for the initial 6 months after a "Myocardial Infarction" (Heart Attack). . Blood thinners: It is imperative that you stop these medications before procedures. Let us know if you if you take any blood thinner.  . Infection: Avoid procedures during or within two weeks of an infection (including chest colds or gastrointestinal problems). Symptoms associated with infections include: Localized redness, fever, chills, night sweats or profuse sweating, burning sensation when voiding, cough, congestion, stuffiness, runny nose, sore throat, diarrhea, nausea, vomiting, cold or Flu symptoms, recent or current infections. It is specially important if the infection is over the area that we intend to treat. . Heart and lung problems: Symptoms that may suggest an active cardiopulmonary problem include: cough, chest pain, breathing difficulties or shortness of breath, dizziness, ankle swelling, uncontrolled high or unusually low blood pressure, and/or palpitations. If you are experiencing any of these symptoms, cancel your procedure and contact your primary care physician for an evaluation.  Remember:  Regular   Business hours are:  Monday to Thursday 8:00 AM to 4:00  PM  Provider's Schedule: Keonia Pasko, MD:  Procedure days: Tuesday and Thursday 7:30 AM to 4:00 PM  Bilal Lateef, MD:  Procedure days: Monday and Wednesday 7:30 AM to 4:00 PM ____________________________________________________________________________________________  Sacroiliac (SI) Joint Injection Patient Information  Description: The sacroiliac joint connects the scrum (very low back and tailbone) to the ilium (a pelvic bone which also forms half of the hip joint).  Normally this joint experiences very little motion.  When this joint becomes inflamed or unstable low back and or hip and pelvis pain may result.  Injection of this joint with local anesthetics (numbing medicines) and steroids can provide diagnostic information and reduce pain.  This injection is performed with the aid of x-ray guidance into the tailbone area while you are lying on your stomach.   You may experience an electrical sensation down the leg while this is being done.  You may also experience numbness.  We also may ask if we are reproducing your normal pain during the injection.  Conditions which may be treated SI injection:   Low back, buttock, hip or leg pain  Preparation for the Injection:  1. Do not eat any solid food or dairy products within 8 hours of your appointment.  2. You may drink clear liquids up to 3 hours before appointment.  Clear liquids include water, black coffee, juice or soda.  No milk or cream please. 3. You may take your regular medications, including pain medications with a sip of water before your appointment.  Diabetics should hold regular insulin (if take separately) and take 1/2 normal NPH dose the morning of the procedure.  Carry some sugar containing items with you to your appointment. 4. A driver must accompany you and be prepared to drive you home after your procedure. 5. Bring all of your current medications with you. 6. An IV may be inserted and sedation may be given at the  discretion of the physician. 7. A blood pressure cuff, EKG and other monitors will often be applied during the procedure.  Some patients may need to have extra oxygen administered for a short period.  8. You will be asked to provide medical information, including your allergies, prior to the procedure.  We must know immediately if you are taking blood thinners (like Coumadin/Warfarin) or if you are allergic to IV iodine contrast (dye).  We must know if you could possible be pregnant.  Possible side effects:   Bleeding from needle site  Infection (rare, may require surgery)  Nerve injury (rare)  Numbness & tingling (temporary)  A brief convulsion or seizure  Light-headedness (temporary)  Pain at injection site (several days)  Decreased blood pressure (temporary)  Weakness in the leg (temporary)   Call if you experience:   New onset weakness or numbness of an extremity below the injection site that last more than 8 hours.  Hives or difficulty breathing ( go to the emergency room)  Inflammation or drainage at the injection site  Any new symptoms which are concerning to you  Please note:  Although the local anesthetic injected can often make your back/ hip/ buttock/ leg feel good for several hours after the injections, the pain will likely return.  It takes 3-7 days for steroids to work in the sacroiliac area.  You may not notice any pain relief for at least that one week.  If effective, we will often do a series of three injections spaced   3-6 weeks apart to maximally decrease your pain.  After the initial series, we generally will wait some months before a repeat injection of the same type.  If you have any questions, please call [phone] [facility]   

## 2020-04-11 NOTE — Telephone Encounter (Signed)
Patient states a medication for her cholesterol level was supposed to be sent into pharmacy. Pharmacy states they have no record of RX being received. Please advise.

## 2020-04-11 NOTE — Telephone Encounter (Signed)
Called and left VM informing patient of this. Scheduled 6 month fasting appt for pt.   CM

## 2020-04-11 NOTE — Progress Notes (Signed)
PROVIDER NOTE: Information contained herein reflects review and annotations entered in association with encounter. Interpretation of such information and data should be left to medically-trained personnel. Information provided to patient can be located elsewhere in the medical record under "Patient Instructions". Document created using STT-dictation technology, any transcriptional errors that may result from process are unintentional.    Patient: Valerie Raymond  Service Category: E/M  Provider: Francisco A Naveira, MD  DOB: 08/26/1951  DOS: 04/11/2020  Specialty: Interventional Pain Management  MRN: [id number]  Setting: Ambulatory outpatient  PCP: Berglund, Laura H, MD  Type: Established Patient    Referring Provider: Berglund, Laura H, MD  Location: Office  Delivery: Face-to-face     HPI  Reason for encounter: Valerie Raymond, a 69 y.o. year old female, is here today for evaluation and management of her Chronic pain syndrome [G89.4]. Valerie Raymond's primary complain today is Back Pain Last encounter: Practice (03/24/2020). My last encounter with her was on 03/23/2020. Pertinent problems: Valerie Raymond has Chronic hip pain (Bilateral) (L>R); Arthritis of knee, degenerative; Other osteoporosis without current pathological fracture; Lumbar pain with radiation down legs (Bilateral); Lumbar radiculopathy; Chronic pain syndrome; Abnormal MRI, lumbar spine (06/29/2019); Grade 1 Lumbar Retrolisthesis L3/L4; DDD (degenerative disc disease), lumbosacral; Lumbosacral intervertebral disc displacement (IVDD); Lumbar facet arthropathy (Multilevel) (Bilateral); Lumbosacral foraminal stenosis (Bilateral) (L5-S1); Lumbar lateral recess stenosis (Left) (L2-3); Chronic lower extremity pain (3ry area of Pain) (Bilateral) (L>R); Chronic low back pain (2ry area of Pain) (Bilateral) (L>R) w/o sciatica; Chronic foot pain (Right); Chronic lumbar radiculitis; Chronic musculoskeletal pain; Chronic thigh pain (1ry area of Pain) (Left); Lower  extremity burning sensation (thigh) (Left); Greater trochanteric bursitis of hip (Left); Lumbar facet syndrome (Bilateral) (L>R); Trochanteric bursitis of hips (Bilateral) (L>R); Spondylosis without myelopathy or radiculopathy, lumbosacral region; Osteoarthritis of hip (Left); Chronic sacroiliac joint pain (Bilateral) (L>R); Enthesopathy of sacroiliac joint (Bilateral); and Somatic dysfunction of sacroiliac joints (Bilateral) on their pertinent problem list. Pain Assessment: Severity of Chronic pain is reported as a 2 /10. Location: Back Lower/pain radiaties down left leg at times. Onset: More than a month ago. Quality: Burning, Aching, Throbbing. Timing: Intermittent. Modifying factor(s): sit down or lasy down with ice pack, meds. Vitals:  height is 5' 6" (1.676 m) and weight is 145 lb (65.8 kg). Her temperature is 97.2 F (36.2 C) (abnormal). Her blood pressure is 162/92 (abnormal) and her pulse is 94. Her oxygen saturation is 99%.   The patient returns to the clinic today after having had a left-sided intra-articular hip joint injection # 2 + a therapeutic left gluteal femoral bursa injection.  According to the patient, this provided her with 100% relief of the pain which lasted for about a week.  After that, the pain started coming back, especially on the area of the lower back and the left groin area.  Provocative maneuvers today during the physical exam showed no significant pain coming from the lumbar facet joints, bilaterally, suggesting that the radiofrequency ablation is still active and providing her with good relief of the pain.  Provocative Patrick maneuver was completely negative bilaterally for hip pain, but positive bilaterally for sacroiliac joint pain.  Based on these findings, I have recommended to proceed with a diagnostic bilateral sacroiliac joint block under fluoroscopic guidance, without IV sedation to determine if this is the cause of the pain.  She indicated that she has also been  experiencing some pain in the left groin area, which could be coming from the SI joint or some   residual pain from the hip bursa.  We will do the diagnostic SI joint injection first to determine if this is the cause.  She also describes some pain that travels down the lateral aspect of the left hip, but based on what she indicates triggers the pain, it is likely referred pain.  Post-Procedure Evaluation  Procedure (03/23/2020): Therapeutic left IA hip joint injection #2 + therapeutic left Gluteofemoral bursa injection #2 under fluoroscopic guidance, no sedation. Pre-procedure pain level: 2/10 Post-procedure: 0/10 (100% relief)  Sedation: None.  Effectiveness during initial hour after procedure(Ultra-Short Term Relief): 100 %.  Local anesthetic used: Long-acting (4-6 hours) Effectiveness: Defined as any analgesic benefit obtained secondary to the administration of local anesthetics. This carries significant diagnostic value as to the etiological location, or anatomical origin, of the pain. Duration of benefit is expected to coincide with the duration of the local anesthetic used.  Effectiveness during initial 4-6 hours after procedure(Short-Term Relief): 100 %.  Long-term benefit: Defined as any relief past the pharmacologic duration of the local anesthetics.  Effectiveness past the initial 6 hours after procedure(Long-Term Relief): 50 % (pain free for a week and now about 50%).  Current benefits: Defined as benefit that persist at this time.   Analgesia:  50% improved Function: Somewhat improved ROM: Somewhat improved  Pharmacotherapy Assessment   Analgesic: No opioid analgesics prescribed by our practice.   Monitoring: [place] PMP: PDMP reviewed during this encounter.       Pharmacotherapy: No side-effects or adverse reactions reported. Compliance: No problems identified. Effectiveness: Clinically acceptable.  No notes on file  UDS: No results found for: SUMMARY   ROS  Constitutional:  Denies any fever or chills Gastrointestinal: No reported hemesis, hematochezia, vomiting, or acute GI distress Musculoskeletal: Denies any acute onset joint swelling, redness, loss of ROM, or weakness Neurological: No reported episodes of acute onset apraxia, aphasia, dysarthria, agnosia, amnesia, paralysis, loss of coordination, or loss of consciousness  Medication Review  Calcium Carbonate-Vit D-Min, Vitamin D3, alendronate, atorvastatin, carboxymethylcellulose, conjugated estrogens, meloxicam, and pantoprazole  History Review  Allergy: Valerie Raymond is allergic to lodine [etodolac]. Drug: Valerie Raymond  reports no history of drug use. Alcohol:  reports no history of alcohol use. Tobacco:  reports that she has never smoked. She has never used smokeless tobacco. Social: Valerie Raymond  reports that she has never smoked. She has never used smokeless tobacco. She reports that she does not drink alcohol and does not use drugs. Medical:  has a past medical history of Arthralgia of hip (07/23/2015), Arthritis, Degenerative disc disease, lumbar, DJD (degenerative joint disease), Dysphagia, Endometriosis, GERD (gastroesophageal reflux disease), Heartburn, Joint pain, Osteoporosis, Pelvic pressure in female (01/02/2016), Rash, and Vitamin D deficiency. Surgical: Valerie Raymond  has a past surgical history that includes Colonoscopy (2006); Colonoscopy (N/A, 01/23/2015); Abdominal hysterectomy (1992); Salpingoophorectomy (Bilateral); Esophagogastroduodenoscopy (N/A, 04/21/2017); Esophageal dilation (04/21/2017); Esophagogastroduodenoscopy (egd) with propofol (N/A, 07/07/2017); Esophageal dilation (07/07/2017); Esophagogastroduodenoscopy (egd) with propofol (N/A, 07/28/2017); and Esophageal dilation (N/A, 07/28/2017). Family: family history includes Breast cancer in her cousin and maternal aunt; Breast cancer (age of onset: 35) in her sister; Breast cancer (age of onset: 50) in her sister; Colon cancer in her paternal uncle; Pancreatic  cancer in her mother; Prostate cancer in her brother and father.  Laboratory Chemistry Profile   Renal Lab Results  Component Value Date   BUN 9 04/06/2020   CREATININE 0.74 04/06/2020   BCR 12 04/06/2020   GFRAA 96 04/06/2020   GFRNONAA 84 04/06/2020       Hepatic Lab Results  Component Value Date   AST 22 04/06/2020   ALT 15 04/06/2020   ALBUMIN 4.6 04/06/2020   ALKPHOS 79 04/06/2020     Electrolytes Lab Results  Component Value Date   NA 141 04/06/2020   K 4.6 04/06/2020   CL 103 04/06/2020   CALCIUM 10.0 04/06/2020   MG 2.3 08/04/2019     Bone Lab Results  Component Value Date   VD25OH 20.8 (L) 04/06/2020   25OHVITD1 39 08/04/2019   25OHVITD2 <1.0 08/04/2019   25OHVITD3 39 08/04/2019     Inflammation (CRP: Acute Phase) (ESR: Chronic Phase) Lab Results  Component Value Date   CRP 3 08/04/2019   ESRSEDRATE 12 08/04/2019       Note: Above Lab results reviewed.  Recent Imaging Review  DG PAIN CLINIC C-ARM 1-60 MIN NO REPORT Fluoro was used, but no Radiologist interpretation will be provided.  Please refer to "NOTES" tab for provider progress note. Note: Reviewed        Physical Exam  General appearance: Well nourished, well developed, and well hydrated. In no apparent acute distress Mental status: Alert, oriented x 3 (person, place, & time)       Respiratory: No evidence of acute respiratory distress Eyes: PERLA Vitals: BP (!) 162/92   Pulse 94   Temp (!) 97.2 F (36.2 C)   Ht 5' 6" (1.676 m)   Wt 145 lb (65.8 kg)   SpO2 99%   BMI 23.40 kg/m  BMI: Estimated body mass index is 23.4 kg/m as calculated from the following:   Height as of this encounter: 5' 6" (1.676 m).   Weight as of this encounter: 145 lb (65.8 kg). Ideal: Ideal body weight: 59.3 kg (130 lb 11.7 oz) Adjusted ideal body weight: 61.9 kg (136 lb 7 oz)  Assessment   Status Diagnosis  Controlled Improving Improving 1. Chronic pain syndrome   2. Chronic thigh pain (1ry area of  Pain) (Left)   3. Chronic low back pain (2ry area of Pain) (Bilateral) (L>R) w/o sciatica   4. Chronic lower extremity pain (3ry area of Pain) (Bilateral) (L>R)   5. Chronic sacroiliac joint pain (Bilateral) (L>R)   6. Enthesopathy of sacroiliac joint (Bilateral)   7. Somatic dysfunction of sacroiliac joints (Bilateral)      Updated Problems: Problem  Chronic sacroiliac joint pain (Bilateral) (L>R)  Enthesopathy of sacroiliac joint (Bilateral)  Somatic dysfunction of sacroiliac joints (Bilateral)   Plan of Care  Problem-specific:  No problem-specific Assessment & Plan notes found for this encounter.  Valerie Raymond has a current medication list which includes the following long-term medication(s): atorvastatin, calcium carbonate-vit d-min, and pantoprazole.  Pharmacotherapy (Medications Ordered): No orders of the defined types were placed in this encounter.  Orders:  Orders Placed This Encounter  Procedures  . SACROILIAC JOINT INJECTION    Standing Status:   Future    Standing Expiration Date:   05/12/2020    Scheduling Instructions:     Side: Bilateral     Sedation: No Sedation.     Timeframe: ASAP    Order Specific Question:   Where will this procedure be performed?    Answer:   [facility]   Follow-up plan:   Return for Procedure (no sedation): (B) SI BLK #1.      Interventional management options: Planned, scheduled, and/or pending:      Considering:   Diagnostic left trochanteric bursa injection #2  Diagnostic bilateral sacroiliac joint block #  1  Possible bilateral SI RFA #1    Palliative PRN treatment(s):   Palliative left Gluteofemoral bursa injection #2 Diagnostic/therapeutic bilateral lumbar facet block #3 (100/90/50) Palliative left IA hip joint injection #3 (100/100/0) Palliative left trochanteric bursa injection #2 (100/50/50)  Palliative left lumbar facet RFA #2 (last done 12/23/2019) (100/0/90% for the top of the leg and 50% for the  lateral aspect of the leg) Palliative right lumbar facet RFA #2 (last done 02/01/2020) (100/100/55)    Recent Visits Date Type Provider Dept  03/23/20 Procedure visit Naveira, Francisco, MD [facility]  03/15/20 Telemedicine Naveira, Francisco, MD [facility]  02/01/20 Procedure visit Naveira, Francisco, MD [facility]  Showing recent visits within past 90 days and meeting all other requirements Today's Visits Date Type Provider Dept  04/11/20 Office Visit Naveira, Francisco, MD [facility]  Showing today's visits and meeting all other requirements Future Appointments No visits were found meeting these conditions. Showing future appointments within next 90 days and meeting all other requirements  I discussed the assessment and treatment plan with the patient. The patient was provided an opportunity to ask questions and all were answered. The patient agreed with the plan and demonstrated an understanding of the instructions.  Patient advised to call back or seek an in-person evaluation if the symptoms or condition worsens.  Duration of encounter: 30 minutes.  Note by: Francisco A Naveira, MD Date: 04/11/2020; Time: 2:31 PM 

## 2020-04-12 ENCOUNTER — Ambulatory Visit: Payer: Medicare Other

## 2020-04-13 ENCOUNTER — Encounter: Payer: Medicare Other | Admitting: Obstetrics and Gynecology

## 2020-04-14 ENCOUNTER — Encounter: Payer: Medicare Other | Admitting: Obstetrics and Gynecology

## 2020-04-17 ENCOUNTER — Ambulatory Visit (INDEPENDENT_AMBULATORY_CARE_PROVIDER_SITE_OTHER): Payer: Medicare Other

## 2020-04-17 ENCOUNTER — Other Ambulatory Visit: Payer: Self-pay

## 2020-04-17 VITALS — BP 134/82 | HR 68 | Temp 98.0°F | Resp 16 | Ht 66.0 in | Wt 146.8 lb

## 2020-04-17 DIAGNOSIS — Z0000 Encounter for general adult medical examination without abnormal findings: Secondary | ICD-10-CM

## 2020-04-17 NOTE — Progress Notes (Addendum)
Subjective:   Valerie Raymond is a 69 y.o. female who presents for Medicare Annual (Subsequent) preventive examination.  Review of Systems     Cardiac Risk Factors include: advanced age (>77men, >50 women);dyslipidemia     Objective:    Today's Vitals   04/17/20 0815 04/17/20 0816  BP: (!) 134/82   Pulse: 68   Resp: 16   Temp: 98 F (36.7 C)   TempSrc: Oral   SpO2: 99%   Weight: 146 lb 12.8 oz (66.6 kg)   Height: 5\' 6"  (1.676 m)   PainSc:  2    Body mass index is 23.69 kg/m.  Advanced Directives 04/17/2020 03/23/2020 02/01/2020 12/23/2019 10/21/2019 09/28/2019 08/04/2019  Does Patient Have a Medical Advance Directive? No No No No No Yes No  Would patient like information on creating a medical advance directive? No - Patient declined No - Patient declined No - Patient declined No - Patient declined No - Patient declined - -    Current Medications (verified) Outpatient Encounter Medications as of 04/17/2020  Medication Sig  . alendronate (FOSAMAX) 70 MG tablet TAKE 1 TABLET BY MOUTH ONCE A WEEK WITH A GLASS OF WATER ON AN EMPTY STOMACH AND REMAIN UPRIGHT WITHOUT EATING FOR 30 MINUTES  . ARTIFICIAL TEARS 1 % ophthalmic solution   . atorvastatin (LIPITOR) 10 MG tablet Take 1 tablet (10 mg total) by mouth daily.  . Calcium Carbonate-Vit D-Min (CALCIUM 1200 PO) Take 2 tablets by mouth daily.   . Cholecalciferol (VITAMIN D3) 1000 UNITS CAPS Take 2 capsules by mouth daily. am  . conjugated estrogens (PREMARIN) vaginal cream Place vaginally 2 (two) times a week. Use 1-2 times per week.  . meloxicam (MOBIC) 15 MG tablet TAKE 1 TABLET(15 MG) BY MOUTH DAILY (Patient taking differently: as needed. )  . pantoprazole (PROTONIX) 40 MG tablet TAKE 1 TABLET(40 MG) BY MOUTH DAILY IN THE MORNING   No facility-administered encounter medications on file as of 04/17/2020.    Allergies (verified) Lodine [etodolac]   History: Past Medical History:  Diagnosis Date  . Arthralgia of hip 07/23/2015  .  Arthritis    knee,hip,hands  . Degenerative disc disease, lumbar   . DJD (degenerative joint disease)   . Dysphagia    with solids and regurgitation  . Endometriosis    h/o  . GERD (gastroesophageal reflux disease)    occasional  . Heartburn   . Joint pain   . Osteoporosis    hip  . Pelvic pressure in female 01/02/2016  . Rash    skin rash and itching  . Vitamin D deficiency    Past Surgical History:  Procedure Laterality Date  . ABDOMINAL HYSTERECTOMY  1992  . COLONOSCOPY  2006  . COLONOSCOPY N/A 01/23/2015   Procedure: COLONOSCOPY;  Surgeon: Lucilla Lame, MD;  Location: [place];  Service: Gastroenterology;  Laterality: N/A;  cecum- 1962  . ESOPHAGEAL DILATION  04/21/2017   Procedure: ESOPHAGEAL DILATION;  Surgeon: Lucilla Lame, MD;  Location: [place];  Service: Endoscopy;;  . ESOPHAGEAL DILATION  07/07/2017   Procedure: ESOPHAGEAL DILATION;  Surgeon: Lucilla Lame, MD;  Location: [place];  Service: Endoscopy;;  . ESOPHAGEAL DILATION N/A 07/28/2017   Procedure: ESOPHAGEAL DILATION;  Surgeon: Lucilla Lame, MD;  Location: [place];  Service: Endoscopy;  Laterality: N/A;  . ESOPHAGOGASTRODUODENOSCOPY N/A 04/21/2017   Procedure: ESOPHAGOGASTRODUODENOSCOPY (EGD);  Surgeon: Lucilla Lame, MD;  Location: [place];  Service: Endoscopy;  Laterality: N/A;  . ESOPHAGOGASTRODUODENOSCOPY (EGD)  WITH PROPOFOL N/A 07/07/2017   Procedure: ESOPHAGOGASTRODUODENOSCOPY (EGD) WITH PROPOFOL;  Surgeon: Lucilla Lame, MD;  Location: [place];  Service: Endoscopy;  Laterality: N/A;  . ESOPHAGOGASTRODUODENOSCOPY (EGD) WITH PROPOFOL N/A 07/28/2017   Procedure: ESOPHAGOGASTRODUODENOSCOPY (EGD) WITH PROPOFOL;  Surgeon: Lucilla Lame, MD;  Location: [place];  Service: Endoscopy;  Laterality: N/A;  . SALPINGOOPHORECTOMY Bilateral    Family History  Problem Relation Age of Onset  . Pancreatic cancer Mother   . Breast cancer Sister 46  .  Prostate cancer Father   . Breast cancer Maternal Aunt   . Breast cancer Cousin        pat cousin  . Breast cancer Sister 40  . Colon cancer Paternal Uncle   . Prostate cancer Brother   . Ovarian cancer Neg Hx   . Diabetes Neg Hx    Social History   Socioeconomic History  . Marital status: Single    Spouse name: Not on file  . Number of children: 0  . Years of education: Not on file  . Highest education level: Associate degree: academic program  Occupational History    Employer: [employer]  Tobacco Use  . Smoking status: Never Smoker  . Smokeless tobacco: Never Used  . Tobacco comment: smoking cessation materials not required  Vaping Use  . Vaping Use: Never used  Substance and Sexual Activity  . Alcohol use: No    Alcohol/week: 0.0 standard drinks  . Drug use: No  . Sexual activity: Not Currently    Birth control/protection: Surgical  Other Topics Concern  . Not on file  Social History Narrative  . Not on file   Social Determinants of Health   Financial Resource Strain: Low Risk   . Difficulty of Paying Living Expenses: Not hard at all  Food Insecurity: No Food Insecurity  . Worried About Charity fundraiser in the Last Year: Never true  . Ran Out of Food in the Last Year: Never true  Transportation Needs: No Transportation Needs  . Lack of Transportation (Medical): No  . Lack of Transportation (Non-Medical): No  Physical Activity: Insufficiently Active  . Days of Exercise per Week: 3 days  . Minutes of Exercise per Session: 30 min  Stress: No Stress Concern Present  . Feeling of Stress : Not at all  Social Connections: Socially Isolated  . Frequency of Communication with Friends and Family: More than three times a week  . Frequency of Social Gatherings with Friends and Family: More than three times a week  . Attends Religious Services: Never  . Active Member of Clubs or Organizations: No  . Attends Archivist Meetings: Never  .  Marital Status: Never married    Tobacco Counseling Counseling given: Not Answered Comment: smoking cessation materials not required   Clinical Intake:  Pre-visit preparation completed: Yes  Pain : 0-10 Pain Score: 2  Pain Type: Chronic pain Pain Location: Back Pain Orientation: Lower Pain Descriptors / Indicators: Aching, Sore Pain Onset: More than a month ago Pain Frequency: Constant     BMI - recorded: 23.69 Nutritional Status: BMI of 19-24  Normal Nutritional Risks: None Diabetes: No  How often do you need to have someone help you when you read instructions, pamphlets, or other written materials from your doctor or pharmacy?: 1 - Never    Interpreter Needed?: No  Information entered by :: Clemetine Marker LPN   Activities of Daily Living In your present state of health, do you  have any difficulty performing the following activities: 04/17/2020 04/06/2020  Hearing? N N  Comment declines hearing aids -  Vision? N N  Difficulty concentrating or making decisions? N N  Walking or climbing stairs? N N  Dressing or bathing? N N  Doing errands, shopping? N N  Preparing Food and eating ? N -  Using the Toilet? N -  In the past six months, have you accidently leaked urine? N -  Do you have problems with loss of bowel control? N -  Managing your Medications? N -  Managing your Finances? N -  Housekeeping or managing your Housekeeping? N -  Some recent data might be hidden    Patient Care Team: [name], MD as PCP - General (Internal Medicine) Defrancesco, Alanda Slim, MD as Consulting Physician (Obstetrics and Gynecology) Jannet Mantis, MD as Consulting Physician (Dermatology) Milinda Pointer, MD as Referring Physician (Pain Medicine)  Indicate any recent Medical Services you may have received from other than [facility] providers in the past year (date may be approximate).     Assessment:   This is a routine wellness examination for  [place].  Hearing/Vision screen  Hearing Screening   125Hz  250Hz  500Hz  1000Hz  2000Hz  3000Hz  4000Hz  6000Hz  8000Hz   Right ear:           Left ear:           Comments: Pt denies hearing difficulty   Vision Screening Comments: Annual vision screenings done [facility] [place] Dr. Edison Pace  Dietary issues and exercise activities discussed: Current Exercise Habits: Home exercise routine, Type of exercise: treadmill;strength training/weights, Time (Minutes): 30, Frequency (Times/Week): 3, Weekly Exercise (Minutes/Week): 90, Intensity: Moderate, Exercise limited by: orthopedic condition(s)  Goals    . DIET - INCREASE WATER INTAKE     Recommend to drink at least 6-8 8oz glasses of water per day.    . Increase physical activity     Pt would like to be able to get back to the gym once opened, she was going 4 days per week prior to covid-19.    . Weight < 200 lb (90.719 kg)      Depression Screen PHQ 2/9 Scores 04/17/2020 04/06/2020 03/23/2020 12/23/2019 10/21/2019 08/17/2019 08/04/2019  PHQ - 2 Score 0 0 0 0 0 0 0  PHQ- 9 Score - 0 - - - - -  Exception Documentation - - Patient refusal - - - -    Fall Risk Fall Risk  04/17/2020 04/11/2020 04/06/2020 03/23/2020 12/23/2019  Falls in the past year? 0 0 0 0 0  Number falls in past yr: 0 - - 0 -  Injury with Fall? 0 - - 0 -  Risk for fall due to : No Fall Risks - No Fall Risks No Fall Risks -  Risk for fall due to: Comment - - - - -  Follow up Falls prevention discussed - Falls evaluation completed Falls evaluation completed -    Any stairs in or around the home? Yes  If so, are there any without handrails? No  Home free of loose throw rugs in walkways, pet beds, electrical cords, etc? Yes  Adequate lighting in your home to reduce risk of falls? Yes   ASSISTIVE DEVICES UTILIZED TO PREVENT FALLS:  Life alert? No  Use of a cane, walker or w/c? No  Grab bars in the bathroom? No  Shower chair or bench in shower? Yes  Elevated toilet seat or a handicapped  toilet? No  TIMED UP AND GO:  Was the test performed? Yes .  Length of time to ambulate 10 feet: 5 sec.   Gait steady and fast without use of assistive device  Cognitive Function: 6CIT deferred for 2021 AWV; pt has no memory issues     6CIT Screen 03/29/2019 03/23/2018 03/17/2017  What Year? 0 points 0 points 0 points  What month? 0 points 0 points 0 points  What time? 0 points 0 points 0 points  Count back from 20 0 points 0 points 0 points  Months in reverse 0 points 0 points 0 points  Repeat phrase 0 points 2 points 2 points  Total Score 0 2 2    Immunizations Immunization History  Administered Date(s) Administered  . Fluad Quad(high Dose 65+) 05/20/2019  . Influenza-Unspecified 05/23/2015, 06/01/2018  . PFIZER SARS-COV-2 Vaccination 11/11/2019, 12/07/2019  . Pneumococcal Conjugate-13 03/17/2017  . Pneumococcal Polysaccharide-23 03/23/2018  . Tdap 03/13/2016  . Zoster 08/19/2011  . Zoster Recombinat (Shingrix) 08/31/2018, 11/18/2018    TDAP status: Up to date   Flu Vaccine status: Up to date   Pneumococcal vaccine status: Up to date   Covid-19 vaccine status: Completed vaccines  Qualifies for Shingles Vaccine? Yes   Zostavax completed Yes   Shingrix Completed?: Yes  Screening Tests Health Maintenance  Topic Date Due  . INFLUENZA VACCINE  04/16/2020  . MAMMOGRAM  04/10/2021  . COLONOSCOPY  01/22/2025  . TETANUS/TDAP  03/13/2026  . DEXA SCAN  Completed  . COVID-19 Vaccine  Completed  . Hepatitis C Screening  Completed  . PNA vac Low Risk Adult  Completed    Health Maintenance  Health Maintenance Due  Topic Date Due  . INFLUENZA VACCINE  04/16/2020    Colorectal cancer screening: Completed 01/23/15. Repeat every 10 years   Mammogram status: Completed 04/10/20. Repeat every year   Bone Density status: Completed 04/13/18. Results reflect: Bone density results: OSTEOPENIA. Repeat every 2 years.  Lung Cancer Screening: (Low Dose CT Chest recommended if Age  73-80 years, 30 pack-year currently smoking OR have quit w/in 15years.) does not qualify.    Additional Screening:  Hepatitis C Screening: does qualify; Completed 03/13/16  Vision Screening: Recommended annual ophthalmology exams for early detection of glaucoma and other disorders of the eye. Is the patient up to date with their annual eye exam?  Yes  Who is the provider or what is the name of the office in which the patient attends annual eye exams? Dr. Edison Pace [facility]   Dental Screening: Recommended annual dental exams for proper oral hygiene  Community Resource Referral / Chronic Care Management: CRR required this visit?  No   CCM required this visit?  No      Plan:     I have personally reviewed and noted the following in the patient's chart:   . Medical and social history . Use of alcohol, tobacco or illicit drugs  . Current medications and supplements . Functional ability and status . Nutritional status . Physical activity . Advanced directives . List of other physicians . Hospitalizations, surgeries, and ER visits in previous 12 months . Vitals . Screenings to include cognitive, depression, and falls . Referrals and appointments  In addition, I have reviewed and discussed with patient certain preventive protocols, quality metrics, and best practice recommendations. A written personalized care plan for preventive services as well as general preventive health recommendations were provided to patient.     Clemetine Marker, LPN   02/17/[id number]   Nurse  Notes: pt doing well and appreciative of visit today

## 2020-04-17 NOTE — Patient Instructions (Signed)
Valerie Raymond , Thank you for taking time to come for your Medicare Wellness Visit. I appreciate your ongoing commitment to your health goals. Please review the following plan we discussed and let me know if I can assist you in the future.   Screening recommendations/referrals: Colonoscopy: done 01/23/15. Repeat in 2026 Mammogram: done 04/10/20 Bone Density: done 04/13/18 Recommended yearly ophthalmology/optometry visit for glaucoma screening and checkup Recommended yearly dental visit for hygiene and checkup  Vaccinations: Influenza vaccine: done 05/20/19 Pneumococcal vaccine: done 03/23/18 Tdap vaccine: done 03/13/16 Shingles vaccine: done 08/31/18 & 11/18/18   Covid-19:done 11/11/19 & 12/07/19  Advanced directives: Please bring a copy of your health care power of attorney and living will to the office at your convenience once you have completed that paperwork  Conditions/risks identified: Keep up the great work!  Next appointment: Follow up in one year for your annual wellness visit    Preventive Care 65 Years and Older, Female Preventive care refers to lifestyle choices and visits with your health care provider that can promote health and wellness. What does preventive care include?  A yearly physical exam. This is also called an annual well check.  Dental exams once or twice a year.  Routine eye exams. Ask your health care provider how often you should have your eyes checked.  Personal lifestyle choices, including:  Daily care of your teeth and gums.  Regular physical activity.  Eating a healthy diet.  Avoiding tobacco and drug use.  Limiting alcohol use.  Practicing safe sex.  Taking low-dose aspirin every day.  Taking vitamin and mineral supplements as recommended by your health care provider. What happens during an annual well check? The services and screenings done by your health care provider during your annual well check will depend on your age, overall health, lifestyle  risk factors, and family history of disease. Counseling  Your health care provider may ask you questions about your:  Alcohol use.  Tobacco use.  Drug use.  Emotional well-being.  Home and relationship well-being.  Sexual activity.  Eating habits.  History of falls.  Memory and ability to understand (cognition).  Work and work Statistician.  Reproductive health. Screening  You may have the following tests or measurements:  Height, weight, and BMI.  Blood pressure.  Lipid and cholesterol levels. These may be checked every 5 years, or more frequently if you are over 88 years old.  Skin check.  Lung cancer screening. You may have this screening every year starting at age 50 if you have a 30-pack-year history of smoking and currently smoke or have quit within the past 15 years.  Fecal occult blood test (FOBT) of the stool. You may have this test every year starting at age 6.  Flexible sigmoidoscopy or colonoscopy. You may have a sigmoidoscopy every 5 years or a colonoscopy every 10 years starting at age 35.  Hepatitis C blood test.  Hepatitis B blood test.  Sexually transmitted disease (STD) testing.  Diabetes screening. This is done by checking your blood sugar (glucose) after you have not eaten for a while (fasting). You may have this done every 1-3 years.  Bone density scan. This is done to screen for osteoporosis. You may have this done starting at age 19.  Mammogram. This may be done every 1-2 years. Talk to your health care provider about how often you should have regular mammograms. Talk with your health care provider about your test results, treatment options, and if necessary, the need for more tests.  Vaccines  Your health care provider may recommend certain vaccines, such as:  Influenza vaccine. This is recommended every year.  Tetanus, diphtheria, and acellular pertussis (Tdap, Td) vaccine. You may need a Td booster every 10 years.  Zoster vaccine.  You may need this after age 22.  Pneumococcal 13-valent conjugate (PCV13) vaccine. One dose is recommended after age 3.  Pneumococcal polysaccharide (PPSV23) vaccine. One dose is recommended after age 80. Talk to your health care provider about which screenings and vaccines you need and how often you need them. This information is not intended to replace advice given to you by your health care provider. Make sure you discuss any questions you have with your health care provider. Document Released: 09/29/2015 Document Revised: 05/22/2016 Document Reviewed: 07/04/2015 Elsevier Interactive Patient Education  2017 [place] Prevention in the Home Falls can cause injuries. They can happen to people of all ages. There are many things you can do to make your home safe and to help prevent falls. What can I do on the outside of my home?  Regularly fix the edges of walkways and driveways and fix any cracks.  Remove anything that might make you trip as you walk through a door, such as a raised step or threshold.  Trim any bushes or trees on the path to your home.  Use bright outdoor lighting.  Clear any walking paths of anything that might make someone trip, such as rocks or tools.  Regularly check to see if handrails are loose or broken. Make sure that both sides of any steps have handrails.  Any raised decks and porches should have guardrails on the edges.  Have any leaves, snow, or ice cleared regularly.  Use sand or salt on walking paths during winter.  Clean up any spills in your garage right away. This includes oil or grease spills. What can I do in the bathroom?  Use night lights.  Install grab bars by the toilet and in the tub and shower. Do not use towel bars as grab bars.  Use non-skid mats or decals in the tub or shower.  If you need to sit down in the shower, use a plastic, non-slip stool.  Keep the floor dry. Clean up any water that spills on the floor as soon  as it happens.  Remove soap buildup in the tub or shower regularly.  Attach bath mats securely with double-sided non-slip rug tape.  Do not have throw rugs and other things on the floor that can make you trip. What can I do in the bedroom?  Use night lights.  Make sure that you have a light by your bed that is easy to reach.  Do not use any sheets or blankets that are too big for your bed. They should not hang down onto the floor.  Have a firm chair that has side arms. You can use this for support while you get dressed.  Do not have throw rugs and other things on the floor that can make you trip. What can I do in the kitchen?  Clean up any spills right away.  Avoid walking on wet floors.  Keep items that you use a lot in easy-to-reach places.  If you need to reach something above you, use a strong step stool that has a grab bar.  Keep electrical cords out of the way.  Do not use floor polish or wax that makes floors slippery. If you must use wax, use non-skid floor wax.  Do not have throw rugs and other things on the floor that can make you trip. What can I do with my stairs?  Do not leave any items on the stairs.  Make sure that there are handrails on both sides of the stairs and use them. Fix handrails that are broken or loose. Make sure that handrails are as long as the stairways.  Check any carpeting to make sure that it is firmly attached to the stairs. Fix any carpet that is loose or worn.  Avoid having throw rugs at the top or bottom of the stairs. If you do have throw rugs, attach them to the floor with carpet tape.  Make sure that you have a light switch at the top of the stairs and the bottom of the stairs. If you do not have them, ask someone to add them for you. What else can I do to help prevent falls?  Wear shoes that:  Do not have high heels.  Have rubber bottoms.  Are comfortable and fit you well.  Are closed at the toe. Do not wear sandals.  If  you use a stepladder:  Make sure that it is fully opened. Do not climb a closed stepladder.  Make sure that both sides of the stepladder are locked into place.  Ask someone to hold it for you, if possible.  Clearly mark and make sure that you can see:  Any grab bars or handrails.  First and last steps.  Where the edge of each step is.  Use tools that help you move around (mobility aids) if they are needed. These include:  Canes.  Walkers.  Scooters.  Crutches.  Turn on the lights when you go into a dark area. Replace any light bulbs as soon as they burn out.  Set up your furniture so you have a clear path. Avoid moving your furniture around.  If any of your floors are uneven, fix them.  If there are any pets around you, be aware of where they are.  Review your medicines with your doctor. Some medicines can make you feel dizzy. This can increase your chance of falling. Ask your doctor what other things that you can do to help prevent falls. This information is not intended to replace advice given to you by your health care provider. Make sure you discuss any questions you have with your health care provider. Document Released: 06/29/2009 Document Revised: 02/08/2016 Document Reviewed: 10/07/2014 Elsevier Interactive Patient Education  2017 [employer].

## 2020-04-20 ENCOUNTER — Encounter: Payer: Self-pay | Admitting: Obstetrics and Gynecology

## 2020-04-20 ENCOUNTER — Ambulatory Visit: Payer: Medicare Other | Admitting: Obstetrics and Gynecology

## 2020-04-20 VITALS — BP 164/84 | HR 83 | Ht 66.0 in | Wt 147.1 lb

## 2020-04-20 DIAGNOSIS — Z4689 Encounter for fitting and adjustment of other specified devices: Secondary | ICD-10-CM | POA: Diagnosis not present

## 2020-04-20 MED ORDER — TRIMO-SAN 0.025 % VA GEL: 1.0000 | VAGINAL | 2 refills | Status: DC

## 2020-04-20 NOTE — Progress Notes (Signed)
Pt present for pessary check. Pt stated that she was doing well just concerned about her BP. Pt stated that she is currently see her PCP concerning her BP.

## 2020-04-20 NOTE — Progress Notes (Signed)
    GYNECOLOGY CLINIC PROGRESS NOTE  Subjective:    Patient ID: Valerie Raymond, female    DOB: [birth year], 69 y.o.   MRN: [id number]  HPI  Patient is a 69 y.o. G0P0 female who presents for pessary check. She currently has yearly pessary checks as she is able to perform self-maintenance. Removes and cleans pessary daily. She has a h/o cysttocele, incomplete bladder emptying, remote h/o hysterectomy, and vaginal atrophy.  Currently using Premarin cream for pessary maintenance. She has no complaints today. She reports no vaginal bleeding or discharge. She denies pelvic discomfort and difficulty urinating or moving her bowels.   The following portions of the patient's history were reviewed and updated as appropriate: She  has a past medical history of Arthralgia of hip (07/23/2015), Arthritis, Degenerative disc disease, lumbar, DJD (degenerative joint disease), Dysphagia, Endometriosis, GERD (gastroesophageal reflux disease), Heartburn, Joint pain, Osteoporosis, Pelvic pressure in female (01/02/2016), Rash, and Vitamin D deficiency.   She  has a past surgical history that includes Colonoscopy (2006); Colonoscopy (N/A, 01/23/2015); Abdominal hysterectomy (1992); Salpingoophorectomy (Bilateral); Esophagogastroduodenoscopy (N/A, 04/21/2017); Esophageal dilation (04/21/2017); Esophagogastroduodenoscopy (egd) with propofol (N/A, 07/07/2017); Esophageal dilation (07/07/2017); Esophagogastroduodenoscopy (egd) with propofol (N/A, 07/28/2017); and Esophageal dilation (N/A, 07/28/2017).   Her family history includes Breast cancer in her cousin and maternal aunt; Breast cancer (age of onset: 84) in her sister; Breast cancer (age of onset: 73) in her sister; Colon cancer in her paternal uncle; Pancreatic cancer in her mother; Prostate cancer in her brother and father.   She  reports that she has never smoked. She has never used smokeless tobacco. She reports that she does not drink alcohol and does not use drugs.   She  has a current medication list which includes the following prescription(s): alendronate, artificial tears, atorvastatin, calcium carbonate-vit d-min, vitamin d3, premarin, meloxicam, pantoprazole, and trimo-san.   She is allergic to lodine [etodolac]..  Review of Systems Pertinent items noted in HPI and remainder of comprehensive ROS otherwise negative.   Objective:   Blood pressure (!) 164/84, pulse 83, height 5\' 6"  (1.676 m), weight 147 lb 1.6 oz (66.7 kg). General appearance: alert and no distress Abdomen: soft, non-tender; bowel sounds normal; no masses,  no organomegaly Pelvic:  The patient's Size 3 ring with support pessary was removed, cleaned and replaced without complications. Speculum examination revealed normal vaginal mucosa with no lesions or lacerations.    Assessment:   Pessary maintenance  Midline cystocele Incomplete bladder emptying Vaginal atrophy   Plan:   The patient should return yearly for a pessary check and continue to use vaginal estrogen cream weekly as prescribed.Continue routine daily self-maintenance.  Desires to see about switching from Premarin to the Trimo-san gel.  Considering non-hormonal option. Given prescription for Trimo-san gel.    Rubie Maid, MD [facility]

## 2020-04-23 ENCOUNTER — Encounter: Payer: Self-pay | Admitting: Obstetrics and Gynecology

## 2020-04-25 ENCOUNTER — Other Ambulatory Visit: Payer: Self-pay

## 2020-04-25 ENCOUNTER — Encounter: Payer: Self-pay | Admitting: Pain Medicine

## 2020-04-25 ENCOUNTER — Ambulatory Visit (HOSPITAL_BASED_OUTPATIENT_CLINIC_OR_DEPARTMENT_OTHER): Payer: Medicare Other | Admitting: Pain Medicine

## 2020-04-25 ENCOUNTER — Ambulatory Visit
Admission: RE | Admit: 2020-04-25 | Discharge: 2020-04-25 | Disposition: A | Discharge status: Home / Self Care | Payer: Medicare Other | Source: Ambulatory Visit | Attending: Pain Medicine | Admitting: Pain Medicine

## 2020-04-25 VITALS — BP 151/80 | HR 72 | Temp 97.3°F | Resp 16 | Ht 66.0 in | Wt 142.0 lb

## 2020-04-25 DIAGNOSIS — M533 Sacrococcygeal disorders, not elsewhere classified: Secondary | ICD-10-CM | POA: Insufficient documentation

## 2020-04-25 DIAGNOSIS — M779 Enthesopathy, unspecified: Secondary | ICD-10-CM | POA: Diagnosis not present

## 2020-04-25 DIAGNOSIS — G8929 Other chronic pain: Secondary | ICD-10-CM | POA: Diagnosis not present

## 2020-04-25 DIAGNOSIS — M545 Low back pain, unspecified: Secondary | ICD-10-CM

## 2020-04-25 DIAGNOSIS — M47898 Other spondylosis, sacral and sacrococcygeal region: Secondary | ICD-10-CM | POA: Insufficient documentation

## 2020-04-25 DIAGNOSIS — M9904 Segmental and somatic dysfunction of sacral region: Secondary | ICD-10-CM

## 2020-04-25 HISTORY — DX: Other spondylosis, sacral and sacrococcygeal region: M47.898

## 2020-04-25 MED ORDER — ROPIVACAINE HCL 2 MG/ML IJ SOLN
INTRAMUSCULAR | Status: AC
  Filled 2020-04-25: qty 10

## 2020-04-25 MED ORDER — ROPIVACAINE HCL 2 MG/ML IJ SOLN
9.0000 mL | Freq: Once | INTRAMUSCULAR | Status: AC
  Administered 2020-04-25: 9 mL via INTRA_ARTICULAR

## 2020-04-25 MED ORDER — METHYLPREDNISOLONE ACETATE 80 MG/ML IJ SUSP
80.0000 mg | Freq: Once | INTRAMUSCULAR | Status: AC
  Administered 2020-04-25: 80 mg via INTRA_ARTICULAR

## 2020-04-25 MED ORDER — METHYLPREDNISOLONE ACETATE 80 MG/ML IJ SUSP
INTRAMUSCULAR | Status: AC
  Filled 2020-04-25: qty 1

## 2020-04-25 MED ORDER — LIDOCAINE HCL 2 % IJ SOLN
20.0000 mL | Freq: Once | INTRAMUSCULAR | Status: AC
  Administered 2020-04-25: 200 mg

## 2020-04-25 MED ORDER — LIDOCAINE HCL 2 % IJ SOLN
INTRAMUSCULAR | Status: AC
  Filled 2020-04-25: qty 10

## 2020-04-25 NOTE — Progress Notes (Signed)
PROVIDER NOTE: Information contained herein reflects review and annotations entered in association with encounter. Interpretation of such information and data should be left to medically-trained personnel. Information provided to patient can be located elsewhere in the medical record under "Patient Instructions". Document created using STT-dictation technology, any transcriptional errors that may result from process are unintentional.    Patient: Valerie Raymond  Service Category: Procedure  Provider: Gaspar Cola, MD  DOB: 1950/12/03  DOS: 04/25/2020  Location: [facility]  MRN: [id number]  Setting: Ambulatory - outpatient  Referring Provider: [name], MD  Type: Established Patient  Specialty: Interventional Pain Management  PCP: [name], MD   Primary Reason for Visit: Interventional Pain Management Treatment. CC: Leg Pain (left)  Procedure:          Anesthesia, Analgesia, Anxiolysis:  Type: Diagnostic Sacroiliac Joint Steroid Injection #1  Region: Superior Lumbosacral Region Level: PSIS (Posterior Superior Iliac Spine) Laterality: Bilateral  Type: Local Anesthesia Indication(s): Analgesia         Route: Infiltration (Birney/IM) IV Access: Declined Sedation: Declined  Local Anesthetic: Lidocaine 1-2%  Position: Prone           Indications: 1. Chronic sacroiliac joint pain (Bilateral) (L>R)   2. Other spondylosis, sacral and sacrococcygeal region   3. Enthesopathy of sacroiliac joint (Bilateral)   4. Somatic dysfunction of sacroiliac joints (Bilateral)   5. Chronic low back pain (2ry area of Pain) (Bilateral) (L>R) w/o sciatica    Pain Score: Pre-procedure: 1 /10 Post-procedure: 1 /10   Pre-op Assessment:  [name] is a 69 y.o. (year old), female patient, seen today for interventional treatment. She  has a past surgical history that includes Colonoscopy (2006); Colonoscopy (N/A, 01/23/2015); Abdominal hysterectomy (1992); Salpingoophorectomy  (Bilateral); Esophagogastroduodenoscopy (N/A, 04/21/2017); Esophageal dilation (04/21/2017); Esophagogastroduodenoscopy (egd) with propofol (N/A, 07/07/2017); Esophageal dilation (07/07/2017); Esophagogastroduodenoscopy (egd) with propofol (N/A, 07/28/2017); and Esophageal dilation (N/A, 07/28/2017). Ms. Fong has a current medication list which includes the following prescription(s): alendronate, artificial tears, atorvastatin, calcium carbonate-vit d-min, vitamin d3, premarin, meloxicam, trimo-san, pantoprazole, and trimo-san. Her primarily concern today is the Leg Pain (left)  Initial Vital Signs:  Pulse/HCG Rate: 65  Temp: (!) 97.3 F (36.3 C) Resp: 18 BP: (!) 167/88 SpO2: 100 %  BMI: Estimated body mass index is 22.92 kg/m as calculated from the following:   Height as of this encounter: 5\' 6"  (1.676 m).   Weight as of this encounter: 142 lb (64.4 kg).  Risk Assessment: Allergies: Reviewed. She is allergic to lodine [etodolac].  Allergy Precautions: None required Coagulopathies: Reviewed. None identified.  Blood-thinner therapy: None at this time Active Infection(s): Reviewed. None identified. Ms. Hartwell is afebrile  Site Confirmation: Ms. Linnen was asked to confirm the procedure and laterality before marking the site Procedure checklist: Completed Consent: Before the procedure and under the influence of no sedative(s), amnesic(s), or anxiolytics, the patient was informed of the treatment options, risks and possible complications. To fulfill our ethical and legal obligations, as recommended by the American Medical Association's Code of Ethics, I have informed the patient of my clinical impression; the nature and purpose of the treatment or procedure; the risks, benefits, and possible complications of the intervention; the alternatives, including doing nothing; the risk(s) and benefit(s) of the alternative treatment(s) or procedure(s); and the risk(s) and benefit(s) of doing nothing. The patient  was provided information about the general risks and possible complications associated with the procedure. These may include, but are not limited to: failure  to achieve desired goals, infection, bleeding, organ or nerve damage, allergic reactions, paralysis, and death. In addition, the patient was informed of those risks and complications associated to the procedure, such as failure to decrease pain; infection; bleeding; organ or nerve damage with subsequent damage to sensory, motor, and/or autonomic systems, resulting in permanent pain, numbness, and/or weakness of one or several areas of the body; allergic reactions; (i.e.: anaphylactic reaction); and/or death. Furthermore, the patient was informed of those risks and complications associated with the medications. These include, but are not limited to: allergic reactions (i.e.: anaphylactic or anaphylactoid reaction(s)); adrenal axis suppression; blood sugar elevation that in diabetics may result in ketoacidosis or comma; water retention that in patients with history of congestive heart failure may result in shortness of breath, pulmonary edema, and decompensation with resultant heart failure; weight gain; swelling or edema; medication-induced neural toxicity; particulate matter embolism and blood vessel occlusion with resultant organ, and/or nervous system infarction; and/or aseptic necrosis of one or more joints. Finally, the patient was informed that Medicine is not an exact science; therefore, there is also the possibility of unforeseen or unpredictable risks and/or possible complications that may result in a catastrophic outcome. The patient indicated having understood very clearly. We have given the patient no guarantees and we have made no promises. Enough time was given to the patient to ask questions, all of which were answered to the patient's satisfaction. Ms. Reif has indicated that she wanted to continue with the procedure. Attestation: I, the  ordering provider, attest that I have discussed with the patient the benefits, risks, side-effects, alternatives, likelihood of achieving goals, and potential problems during recovery for the procedure that I have provided informed consent. Date  Time: 04/25/2020  7:52 AM  Pre-Procedure Preparation:  Monitoring: As per clinic protocol. Respiration, ETCO2, SpO2, BP, heart rate and rhythm monitor placed and checked for adequate function Safety Precautions: Patient was assessed for positional comfort and pressure points before starting the procedure. Time-out: I initiated and conducted the "Time-out" before starting the procedure, as per protocol. The patient was asked to participate by confirming the accuracy of the "Time Out" information. Verification of the correct person, site, and procedure were performed and confirmed by me, the nursing staff, and the patient. "Time-out" conducted as per Joint Commission's Universal Protocol (UP.01.01.01). Time: 0820  Description of Procedure:          Target Area: Superior, posterior, aspect of the sacroiliac fissure Approach: Posterior, paraspinal, ipsilateral approach. Area Prepped: Entire Lower Lumbosacral Region DuraPrep (Iodine Povacrylex [0.7% available iodine] and Isopropyl Alcohol, 74% w/w) Safety Precautions: Aspiration looking for blood return was conducted prior to all injections. At no point did we inject any substances, as a needle was being advanced. No attempts were made at seeking any paresthesias. Safe injection practices and needle disposal techniques used. Medications properly checked for expiration dates. SDV (single dose vial) medications used. Description of the Procedure: Protocol guidelines were followed. The patient was placed in position over the procedure table. The target area was identified and the area prepped in the usual manner. Skin & deeper tissues infiltrated with local anesthetic. Appropriate amount of time allowed to pass for  local anesthetics to take effect. The procedure needle was advanced under fluoroscopic guidance into the sacroiliac joint until a firm endpoint was obtained. Proper needle placement secured. Negative aspiration confirmed. Solution injected in intermittent fashion, asking for systemic symptoms every 0.5cc of injectate. The needles were then removed and the area cleansed, making sure to  leave some of the prepping solution back to take advantage of its long term bactericidal properties. Vitals:   04/25/20 0752 04/25/20 0810 04/25/20 0820 04/25/20 0828  BP: (!) 167/88 (!) 148/71 (!) 148/78 (!) 151/80  Pulse: 65 66 74 72  Resp: 18 19 17 16   Temp: (!) 97.3 F (36.3 C)     SpO2: 100% 98% 99% 99%  Weight: 142 lb (64.4 kg)     Height: 5\' 6"  (1.676 m)       Start Time: 0820 hrs. End Time: 0823 hrs. Materials:  Needle(s) Type: Spinal Needle Gauge: 22G Length: 3.5-in Medication(s): Please see orders for medications and dosing details.  Imaging Guidance (Non-Spinal):          Type of Imaging Technique: Fluoroscopy Guidance (Non-Spinal) Indication(s): Assistance in needle guidance and placement for procedures requiring needle placement in or near specific anatomical locations not easily accessible without such assistance. Exposure Time: Please see nurses notes. Contrast: Before injecting any contrast, we confirmed that the patient did not have an allergy to iodine, shellfish, or radiological contrast. Once satisfactory needle placement was completed at the desired level, radiological contrast was injected. Contrast injected under live fluoroscopy. No contrast complications. See chart for type and volume of contrast used. Fluoroscopic Guidance: I was personally present during the use of fluoroscopy. "Tunnel Vision Technique" used to obtain the best possible view of the target area. Parallax error corrected before commencing the procedure. "Direction-depth-direction" technique used to introduce the needle  under continuous pulsed fluoroscopy. Once target was reached, antero-posterior, oblique, and lateral fluoroscopic projection used confirm needle placement in all planes. Images permanently stored in EMR.  mm    Interpretation: I personally interpreted the imaging intraoperatively. Adequate needle placement confirmed in multiple planes. Appropriate spread of contrast into desired area was observed. No evidence of afferent or efferent intravascular uptake. Permanent images saved into the patient's record.  Antibiotic Prophylaxis:   Anti-infectives (From admission, onward)   None     Indication(s): None identified  Post-operative Assessment:  Post-procedure Vital Signs:  Pulse/HCG Rate: 72  Temp: (!) 97.3 F (36.3 C) Resp: 16 BP: (!) 151/80 SpO2: 99 %  EBL: None  Complications: No immediate post-treatment complications observed by team, or reported by patient.  Note: The patient tolerated the entire procedure well. A repeat set of vitals were taken after the procedure and the patient was kept under observation following institutional policy, for this type of procedure. Post-procedural neurological assessment was performed, showing return to baseline, prior to discharge. The patient was provided with post-procedure discharge instructions, including a section on how to identify potential problems. Should any problems arise concerning this procedure, the patient was given instructions to immediately contact us, at any time, without hesitation. In any case, we plan to contact the patient by telephone for a follow-up status report regarding this interventional procedure.  Comments:  No additional relevant information.  Plan of Care  Orders:  Orders Placed This Encounter  Procedures  . SACROILIAC JOINT INJECTION    Scheduling Instructions:     Side: Bilateral     Sedation: Patient's choice.     Timeframe: Today    Order Specific Question:   Where will this procedure be performed?     Answer:   [facility]  . DG PA[facility] C-ARM 1-60 MIN NO REPORT    Intraoperative interpretation by procedural physician at [place].    Standing Status:   Standing    Number of Occurrences:  1    Order Specific Question:   Reason for exam:    Answer:   Assistance in needle guidance and placement for procedures requiring needle placement in or near specific anatomical locations not easily accessible without such assistance.  . Informed Consent Details: Physician/Practitioner Attestation; Transcribe to consent form and obtain patient signature    Provider Attestation: I, [place] Dossie Arbour, MD, (Pain Management Specialist), the physician/practitioner, attest that I have discussed with the patient the benefits, risks, side effects, alternatives, likelihood of achieving goals and potential problems during recovery for the procedure that I have provided informed consent.    Scheduling Instructions:     Procedure: Sacroiliac Joint Block     Indication/Reason: Chronic Low Back and Hip Pain secondary to Sacroiliac Joint Pain (Arthralgia/Arthropathy)     Nursing Order: Transcribe to consent form and obtain patient signature.     Note: Always confirm laterality of pain with Ms. Amyx, before procedure.  . Provide equipment / supplies at bedside    Equipment required: Single use, disposable, "Block Tray"    Standing Status:   Standing    Number of Occurrences:   1    Order Specific Question:   Specify    Answer:   Block Tray   Chronic Opioid Analgesic:  No opioid analgesics prescribed by our practice.   Medications ordered for procedure: Meds ordered this encounter  Medications  . lidocaine (XYLOCAINE) 2 % (with pres) injection 400 mg  . methylPREDNISolone acetate (DEPO-MEDROL) injection 80 mg  . ropivacaine (PF) 2 mg/mL (0.2%) (NAROPIN) injection 9 mL   Medications administered: We administered lidocaine, methylPREDNISolone acetate, and ropivacaine (PF) 2 mg/mL (0.2%).   See the medical record for exact dosing, route, and time of administration.  Follow-up plan:   Return in about 2 weeks (around 05/09/2020) for (20-min), (F2F), (PP) Follow-up, PM on Proc-day.       Interventional management options: Planned, scheduled, and/or pending:      Considering:   Diagnostic left trochanteric bursa injection #2  Diagnostic bilateral sacroiliac joint block #1  Possible bilateral SI RFA #1    Palliative PRN treatment(s):   Palliative left Gluteofemoral bursa injection #2 Diagnostic/therapeutic bilateral lumbar facet block #3 (100/90/50) Palliative left IA hip joint injection #3 (100/100/0) Palliative left trochanteric bursa injection #2 (100/50/50)  Palliative left lumbar facet RFA #2 (last done 12/23/2019) (100/0/90% for the top of the leg and 50% for the lateral aspect of the leg) Palliative right lumbar facet RFA #2 (last done 02/01/2020) (100/100/55)     Recent Visits Date Type Provider Dept  04/11/20 Office Visit Milinda Pointer, MD [facility]  03/23/20 Procedure visit Milinda Pointer, MD [facility]  03/15/20 Telemedicine Milinda Pointer, MD [facility]  02/01/20 Procedure visit Milinda Pointer, MD [facility]  Showing recent visits within past 90 days and meeting all other requirements Today's Visits Date Type Provider Dept  04/25/20 Procedure visit Milinda Pointer, MD [facility]  Showing today's visits and meeting all other requirements Future Appointments Date Type Provider Dept  05/11/20 Appointment Milinda Pointer, MD [facility]  Showing future appointments within next 90 days and meeting all other requirements  Disposition: Discharge home  Discharge (Date  Time): 04/25/2020; 0829 hrs.   Primary Care Physician: [name], MD Location: [facility] Note by: Gaspar Cola, MD Date: 04/25/2020; Time: 8:50 AM  Disclaimer:    Medicine is not an Chief Strategy Officer. The only guarantee in medicine is  that nothing is guaranteed. It is important to note that the decision to proceed with this intervention was based on the information collected from the patient. The Data and conclusions were drawn from the patient's questionnaire, the interview, and the physical examination. Because the information was provided in large part by the patient, it cannot be guaranteed that it has not been purposely or unconsciously manipulated. Every effort has been made to obtain as much relevant data as possible for this evaluation. It is important to note that the conclusions that lead to this procedure are derived in large part from the available data. Always take into account that the treatment will also be dependent on availability of resources and existing treatment guidelines, considered by other Pain Management Practitioners as being common knowledge and practice, at the time of the intervention. For Medico-Legal purposes, it is also important to point out that variation in procedural techniques and pharmacological choices are the acceptable norm. The indications, contraindications, technique, and results of the above procedure should only be interpreted and judged by a Board-Certified Interventional Pain Specialist with extensive familiarity and expertise in the same exact procedure and technique.

## 2020-04-25 NOTE — Patient Instructions (Addendum)
____________________________________________________________________________________________  Post-Procedure Discharge Instructions  Instructions:  Apply ice:   Purpose: This will minimize any swelling and discomfort after procedure.   When: Day of procedure, as soon as you get home.  How: Fill a plastic sandwich bag with crushed ice. Cover it with a small towel and apply to injection site.  How long: (15 min on, 15 min off) Apply for 15 minutes then remove x 15 minutes.  Repeat sequence on day of procedure, until you go to bed.  Apply heat:   Purpose: To treat any soreness and discomfort from the procedure.  When: Starting the next day after the procedure.  How: Apply heat to procedure site starting the day following the procedure.  How long: May continue to repeat daily, until discomfort goes away.  Food intake: Start with clear liquids (like water) and advance to regular food, as tolerated.   Physical activities: Keep activities to a minimum for the first 8 hours after the procedure. After that, then as tolerated.  Driving: If you have received any sedation, be responsible and do not drive. You are not allowed to drive for 24 hours after having sedation.  Blood thinner: (Applies only to those taking blood thinners) You may restart your blood thinner 6 hours after your procedure.  Insulin: (Applies only to Diabetic patients taking insulin) As soon as you can eat, you may resume your normal dosing schedule.  Infection prevention: Keep procedure site clean and dry. Shower daily and clean area with soap and water.  Post-procedure Pain Diary: Extremely important that this be done correctly and accurately. Recorded information will be used to determine the next step in treatment. For the purpose of accuracy, follow these rules:  Evaluate only the area treated. Do not report or include pain from an untreated area. For the purpose of this evaluation, ignore all other areas of pain,  except for the treated area.  After your procedure, avoid taking a long nap and attempting to complete the pain diary after you wake up. Instead, set your alarm clock to go off every hour, on the hour, for the initial 8 hours after the procedure. Document the duration of the numbing medicine, and the relief you are getting from it.  Do not go to sleep and attempt to complete it later. It will not be accurate. If you received sedation, it is likely that you were given a medication that may cause amnesia. Because of this, completing the diary at a later time may cause the information to be inaccurate. This information is needed to plan your care.  Follow-up appointment: Keep your post-procedure follow-up evaluation appointment after the procedure (usually 2 weeks for most procedures, 6 weeks for radiofrequencies). DO NOT FORGET to bring you pain diary with you.   Expect: (What should I expect to see with my procedure?)  From numbing medicine (AKA: Local Anesthetics): Numbness or decrease in pain. You may also experience some weakness, which if present, could last for the duration of the local anesthetic.  Onset: Full effect within 15 minutes of injected.  Duration: It will depend on the type of local anesthetic used. On the average, 1 to 8 hours.   From steroids (Applies only if steroids were used): Decrease in swelling or inflammation. Once inflammation is improved, relief of the pain will follow.  Onset of benefits: Depends on the amount of swelling present. The more swelling, the longer it will take for the benefits to be seen. In some cases, up to 10 days.    Duration: Steroids will stay in the system x 2 weeks. Duration of benefits will depend on multiple posibilities including persistent irritating factors.  Side-effects: If present, they may typically last 2 weeks (the duration of the steroids).  Frequent: Cramps (if they occur, drink Gatorade and take over-the-counter Magnesium 450-500 mg  once to twice a day); water retention with temporary weight gain; increases in blood sugar; decreased immune system response; increased appetite.  Occasional: Facial flushing (red, warm cheeks); mood swings; menstrual changes.  Uncommon: Long-term decrease or suppression of natural hormones; bone thinning. (These are more common with higher doses or more frequent use. This is why we prefer that our patients avoid having any injection therapies in other practices.)   Very Rare: Severe mood changes; psychosis; aseptic necrosis.  From procedure: Some discomfort is to be expected once the numbing medicine wears off. This should be minimal if ice and heat are applied as instructed.  Call if: (When should I call?)  You experience numbness and weakness that gets worse with time, as opposed to wearing off.  New onset bowel or bladder incontinence. (Applies only to procedures done in the spine)  Emergency Numbers:  Durning business hours (Monday - Thursday, 8:00 AM - 4:00 PM) (Friday, 9:00 AM - 12:00 Noon): [phone]  After hours: [phone]  NOTE: If you are having a problem and are unable connect with, or to talk to a provider, then go to your nearest urgent care or emergency department. If the problem is serious and urgent, please call 911. ____________________________________________________________________________________________  Sacroiliac (SI) Joint Injection Patient Information  Description: The sacroiliac joint connects the scrum (very low back and tailbone) to the ilium (a pelvic bone which also forms half of the hip joint).  Normally this joint experiences very little motion.  When this joint becomes inflamed or unstable low back and or hip and pelvis pain may result.  Injection of this joint with local anesthetics (numbing medicines) and steroids can provide diagnostic information and reduce pain.  This injection is performed with the aid of x-ray guidance into the tailbone  area while you are lying on your stomach.   You may experience an electrical sensation down the leg while this is being done.  You may also experience numbness.  We also may ask if we are reproducing your normal pain during the injection.  Conditions which may be treated SI injection:   Low back, buttock, hip or leg pain  Preparation for the Injection:  1. Do not eat any solid food or dairy products within 8 hours of your appointment.  2. You may drink clear liquids up to 3 hours before appointment.  Clear liquids include water, black coffee, juice or soda.  No milk or cream please. 3. You may take your regular medications, including pain medications with a sip of water before your appointment.  Diabetics should hold regular insulin (if take separately) and take 1/2 normal NPH dose the morning of the procedure.  Carry some sugar containing items with you to your appointment. 4. A driver must accompany you and be prepared to drive you home after your procedure. 5. Bring all of your current medications with you. 6. An IV may be inserted and sedation may be given at the discretion of the physician. 7. A blood pressure cuff, EKG and other monitors will often be applied during the procedure.  Some patients may need to have extra oxygen administered for a short period.  8. You will be asked to   provide medical information, including your allergies, prior to the procedure.  We must know immediately if you are taking blood thinners (like Coumadin/Warfarin) or if you are allergic to IV iodine contrast (dye).  We must know if you could possible be pregnant.  Possible side effects:   Bleeding from needle site  Infection (rare, may require surgery)  Nerve injury (rare)  Numbness & tingling (temporary)  A brief convulsion or seizure  Light-headedness (temporary)  Pain at injection site (several days)  Decreased blood pressure (temporary)  Weakness in the leg (temporary)   Call if you  experience:   New onset weakness or numbness of an extremity below the injection site that last more than 8 hours.  Hives or difficulty breathing ( go to the emergency room)  Inflammation or drainage at the injection site  Any new symptoms which are concerning to you  Please note:  Although the local anesthetic injected can often make your back/ hip/ buttock/ leg feel good for several hours after the injections, the pain will likely return.  It takes 3-7 days for steroids to work in the sacroiliac area.  You may not notice any pain relief for at least that one week.  If effective, we will often do a series of three injections spaced 3-6 weeks apart to maximally decrease your pain.  After the initial series, we generally will wait some months before a repeat injection of the same type.  If you have any questions, please call [phone] [facility]   

## 2020-04-25 NOTE — Progress Notes (Signed)
Safety precautions to be maintained throughout the outpatient stay will include: orient to surroundings, keep bed in low position, maintain call bell within reach at all times, provide assistance with transfer out of bed and ambulation.  

## 2020-04-26 ENCOUNTER — Telehealth: Payer: Self-pay | Admitting: *Deleted

## 2020-04-26 NOTE — Telephone Encounter (Signed)
No problems post procedure. 

## 2020-05-11 ENCOUNTER — Encounter: Payer: Self-pay | Admitting: Pain Medicine

## 2020-05-11 ENCOUNTER — Ambulatory Visit: Payer: Medicare Other | Attending: Pain Medicine | Admitting: Pain Medicine

## 2020-05-11 ENCOUNTER — Other Ambulatory Visit: Payer: Self-pay

## 2020-05-11 VITALS — BP 154/100 | HR 86 | Temp 97.3°F | Resp 14 | Ht 66.0 in | Wt 141.0 lb

## 2020-05-11 DIAGNOSIS — M533 Sacrococcygeal disorders, not elsewhere classified: Secondary | ICD-10-CM | POA: Diagnosis not present

## 2020-05-11 DIAGNOSIS — G8929 Other chronic pain: Secondary | ICD-10-CM | POA: Insufficient documentation

## 2020-05-11 DIAGNOSIS — M25551 Pain in right hip: Secondary | ICD-10-CM | POA: Insufficient documentation

## 2020-05-11 DIAGNOSIS — M545 Low back pain, unspecified: Secondary | ICD-10-CM

## 2020-05-11 DIAGNOSIS — M25552 Pain in left hip: Secondary | ICD-10-CM | POA: Diagnosis not present

## 2020-05-11 NOTE — Progress Notes (Addendum)
PROVIDER NOTE: Information contained herein reflects review and annotations entered in association with encounter. Interpretation of such information and data should be left to medically-trained personnel. Information provided to patient can be located elsewhere in the medical record under "Patient Instructions". Document created using STT-dictation technology, any transcriptional errors that may result from process are unintentional.    Patient: Valerie Raymond  Service Category: E/M  Provider: Gaspar Cola, MD  DOB: [birth year]  DOS: 05/11/2020  Specialty: Interventional Pain Management  MRN: [id number]  Setting: Ambulatory outpatient  PCP: [name], MD  Type: Established Patient    Referring Provider: [name], MD  Location: Office  Delivery: Face-to-face     HPI  Reason for encounter: Valerie Raymond, a 69 y.o. year old female, is here today for evaluation and management of her Chronic hip pain, bilateral [M25.551, M25.552, G89.29]. Valerie Raymond primary complain today is Groin Pain (left) Last encounter: Practice (04/26/2020). My last encounter with her was on 04/25/2020. Pertinent problems: Valerie Raymond has Chronic hip pain (Bilateral) (L>R); Arthritis of knee, degenerative; Other osteoporosis without current pathological fracture; Lumbar pain with radiation down legs (Bilateral); Lumbar radiculopathy; Chronic pain syndrome; Abnormal MRI, lumbar spine (06/29/2019); Grade 1 Lumbar Retrolisthesis L3/L4; DDD (degenerative disc disease), lumbosacral; Lumbosacral intervertebral disc displacement (IVDD); Lumbar facet arthropathy (Multilevel) (Bilateral); Lumbosacral foraminal stenosis (Bilateral) (L5-S1); Lumbar lateral recess stenosis (Left) (L2-3); Chronic lower extremity pain (3ry area of Pain) (Bilateral) (L>R); Chronic low back pain (2ry area of Pain) (Bilateral) (L>R) w/o sciatica; Chronic foot pain (Right); Chronic lumbar radiculitis; Chronic musculoskeletal pain; Chronic thigh pain  (1ry area of Pain) (Left); Lower extremity burning sensation (thigh) (Left); Greater trochanteric bursitis of hip (Left); Lumbar facet syndrome (Bilateral) (L>R); Trochanteric bursitis of hips (Bilateral) (L>R); Spondylosis without myelopathy or radiculopathy, lumbosacral region; Osteoarthritis of hip (Left); Chronic sacroiliac joint pain (Bilateral) (L>R); Enthesopathy of sacroiliac joint (Bilateral); Somatic dysfunction of sacroiliac joints (Bilateral); Other spondylosis, sacral and sacrococcygeal region; and Chronic sacroiliac joint pain (Right) on their pertinent problem list. Pain Assessment: Severity of Chronic pain is reported as a 1 /10. Location: Groin Left/left leg to the knee. Onset: More than a month ago. Quality: Burning. Timing: Intermittent. Modifying factor(s): rest. Vitals:  height is $RemoveB'5\' 6"'gDBWesWY$  (1.676 m) and weight is 141 lb (64 kg). Her temporal temperature is 97.3 F (36.3 C) (abnormal). Her blood pressure is 154/100 (abnormal) and her pulse is 86. Her respiration is 14 and oxygen saturation is 99%.   According to the patient, the diagnostic bilateral sacroiliac joint block provided her with 100% relief of her left hip and leg pain, however the patient did not see any benefit on the right side.  Even the left side where the patient did experience the initial benefit for the duration of the local anesthetic as completely worn off and by now there is no change in her low back pain.  Provocative physical exam today with the Saralyn Pilar maneuver still shows right sacroiliac joint arthralgia, as well as bilateral hip arthralgias.  Review of prior interventional treatments and results would suggest the current problems on the left lower back/hip area are coming from left sacroiliac joint, while on the right side the pain seems to be primarily coming from the hip joint.  Review of x-rays of the hip joints would suggest pathology to be involving the soft tissue and therefore today I will be ordering a  bilateral hip MRI to look into possible pathology missed by the x-rays.  In addition,  I will give consideration into a diagnostic right IA hip joint injection.  Post-Procedure Evaluation  Procedure (04/25/2020): Diagnostic bilateral sacroiliac joint block #1 under fluoroscopic guidance, no sedation Pre-procedure pain level: 1/10 Post-procedure: 1/10 No initial benefit, possibly due to rapid discharge after no sedation procedure, without enough time to allow full onset of block.  Sedation: None.  Effectiveness during initial hour after procedure(Ultra-Short Term Relief): 100 % (0/10 on l. 1/10 on R.).  Local anesthetic used: Long-acting (4-6 hours) Effectiveness: Defined as any analgesic benefit obtained secondary to the administration of local anesthetics. This carries significant diagnostic value as to the etiological location, or anatomical origin, of the pain. Duration of benefit is expected to coincide with the duration of the local anesthetic used.  Effectiveness during initial 4-6 hours after procedure(Short-Term Relief): 100 %.  Long-term benefit: Defined as any relief past the pharmacologic duration of the local anesthetics.  Effectiveness past the initial 6 hours after procedure(Long-Term Relief): 0 %.  Current benefits: Defined as benefit that persist at this time.   Analgesia:  Back to baseline Function: Back to baseline ROM: Back to baseline  Pharmacotherapy Assessment   Analgesic: No opioid analgesics prescribed by our practice.   Monitoring: [place] PMP: PDMP not reviewed this encounter.       Pharmacotherapy: No side-effects or adverse reactions reported. Compliance: No problems identified. Effectiveness: Clinically acceptable.  Landis Martins, RN  05/22/2020  5:11 PM  Signed Safety precautions to be maintained throughout the outpatient stay will include: orient to surroundings, keep bed in low position, maintain call bell within reach at all times, provide assistance with  transfer out of bed and ambulation.     UDS: No results found for: SUMMARY   ROS  Constitutional: Denies any fever or chills Gastrointestinal: No reported hemesis, hematochezia, vomiting, or acute GI distress Musculoskeletal: Denies any acute onset joint swelling, redness, loss of ROM, or weakness Neurological: No reported episodes of acute onset apraxia, aphasia, dysarthria, agnosia, amnesia, paralysis, loss of coordination, or loss of consciousness  Medication Review  Calcium Carbonate-Vit D-Min, OXYQUINOLONE SULFATE VAGINAL, Vitamin D3, alendronate, atorvastatin, carboxymethylcellulose, meloxicam, and pantoprazole  History Review  Allergy: Valerie Raymond is allergic to lodine [etodolac]. Drug: Valerie Raymond  reports no history of drug use. Alcohol:  reports no history of alcohol use. Tobacco:  reports that she has never smoked. She has never used smokeless tobacco. Social: Valerie Raymond  reports that she has never smoked. She has never used smokeless tobacco. She reports that she does not drink alcohol and does not use drugs. Medical:  has a past medical history of Arthralgia of hip (07/23/2015), Arthritis, Degenerative disc disease, lumbar, DJD (degenerative joint disease), Dysphagia, Endometriosis, GERD (gastroesophageal reflux disease), Heartburn, Joint pain, Osteoporosis, Pelvic pressure in female (01/02/2016), Rash, and Vitamin D deficiency. Surgical: Valerie Raymond  has a past surgical history that includes Colonoscopy (2006); Colonoscopy (N/A, 01/23/2015); Abdominal hysterectomy (1992); Salpingoophorectomy (Bilateral); Esophagogastroduodenoscopy (N/A, 04/21/2017); Esophageal dilation (04/21/2017); Esophagogastroduodenoscopy (egd) with propofol (N/A, 07/07/2017); Esophageal dilation (07/07/2017); Esophagogastroduodenoscopy (egd) with propofol (N/A, 07/28/2017); and Esophageal dilation (N/A, 07/28/2017). Family: family history includes Breast cancer in her cousin and maternal aunt; Breast cancer (age of onset: 28) in  her sister; Breast cancer (age of onset: 6) in her sister; Colon cancer in her paternal uncle; Pancreatic cancer in her mother; Prostate cancer in her brother and father.  Laboratory Chemistry Profile   Renal Lab Results  Component Value Date   BUN 9 04/06/2020   CREATININE 0.74 04/06/2020  BCR 12 04/06/2020   GFRAA 96 04/06/2020   GFRNONAA 84 04/06/2020     Hepatic Lab Results  Component Value Date   AST 22 04/06/2020   ALT 15 04/06/2020   ALBUMIN 4.6 04/06/2020   ALKPHOS 79 04/06/2020     Electrolytes Lab Results  Component Value Date   NA 141 04/06/2020   K 4.6 04/06/2020   CL 103 04/06/2020   CALCIUM 10.0 04/06/2020   MG 2.3 08/04/2019     Bone Lab Results  Component Value Date   VD25OH 20.8 (L) 04/06/2020   25OHVITD1 39 08/04/2019   25OHVITD2 <1.0 08/04/2019   25OHVITD3 39 08/04/2019     Inflammation (CRP: Acute Phase) (ESR: Chronic Phase) Lab Results  Component Value Date   CRP 3 08/04/2019   ESRSEDRATE 12 08/04/2019       Note: Above Lab results reviewed.  Recent Imaging Review  MR HIP LEFT WO CONTRAST CLINICAL DATA:  Chronic bilateral hip pain  EXAM: MR OF THE RIGHT HIP WITHOUT CONTRAST  MR OF THE LEFT HIP WITHOUT CONTRAST  TECHNIQUE: Multiplanar, multisequence MR imaging of the right hip was performed. No intravenous contrast was administered.  Multiplanar, multisequence MR imaging of left hip was performed. No intravenous contrast was administered.  COMPARISON:  None.  FINDINGS: Bones:  No hip fracture, dislocation or avascular necrosis. No periosteal reaction or bone destruction. No aggressive osseous lesion.  Normal sacrum and sacroiliac joints. No SI joint widening or erosive changes.  Degenerative disease with disc height loss at L4-5 and L5-S1. Bilateral facet arthropathy at L4-5 and L5-S1.  Articular cartilage and labrum  Articular cartilage:  No chondral defect.  Labrum: Severe degeneration of the right superior  labrum with a small superior anterior labral tear and 5 mm paralabral cyst. Extensive left anterior labral tear.  Joint or bursal effusion  Joint effusion:  No hip joint effusion.  No SI joint effusion.  Bursae:  No bursa formation.  Muscles and tendons  Flexors: Normal.  Extensors: Normal.  Abductors: Normal.  Adductors: Normal.  Gluteals: Normal.  Hamstrings: Normal.  Other findings  No pelvic free fluid. No fluid collection or hematoma. No inguinal lymphadenopathy. No inguinal hernia.  IMPRESSION: 1. Severe degeneration of the right superior labrum with a small superior anterior labral tear and 5 mm paralabral cyst. 2. Extensive left anterior labral tear. 3. No hip fracture, dislocation or avascular necrosis.  Electronically Signed   By: Kathreen Devoid   On: 05/31/2020 14:47 MR HIP RIGHT WO CONTRAST CLINICAL DATA:  Chronic bilateral hip pain  EXAM: MR OF THE RIGHT HIP WITHOUT CONTRAST  MR OF THE LEFT HIP WITHOUT CONTRAST  TECHNIQUE: Multiplanar, multisequence MR imaging of the right hip was performed. No intravenous contrast was administered.  Multiplanar, multisequence MR imaging of left hip was performed. No intravenous contrast was administered.  COMPARISON:  None.  FINDINGS: Bones:  No hip fracture, dislocation or avascular necrosis. No periosteal reaction or bone destruction. No aggressive osseous lesion.  Normal sacrum and sacroiliac joints. No SI joint widening or erosive changes.  Degenerative disease with disc height loss at L4-5 and L5-S1. Bilateral facet arthropathy at L4-5 and L5-S1.  Articular cartilage and labrum  Articular cartilage:  No chondral defect.  Labrum: Severe degeneration of the right superior labrum with a small superior anterior labral tear and 5 mm paralabral cyst. Extensive left anterior labral tear.  Joint or bursal effusion  Joint effusion:  No hip joint effusion.  No SI joint effusion.  Bursae:  No bursa  formation.  Muscles and tendons  Flexors: Normal.  Extensors: Normal.  Abductors: Normal.  Adductors: Normal.  Gluteals: Normal.  Hamstrings: Normal.  Other findings  No pelvic free fluid. No fluid collection or hematoma. No inguinal lymphadenopathy. No inguinal hernia.  IMPRESSION: 1. Severe degeneration of the right superior labrum with a small superior anterior labral tear and 5 mm paralabral cyst. 2. Extensive left anterior labral tear. 3. No hip fracture, dislocation or avascular necrosis.  Electronically Signed   By: Kathreen Devoid   On: 05/31/2020 14:47 Note: Reviewed        Physical Exam  General appearance: Well nourished, well developed, and well hydrated. In no apparent acute distress Mental status: Alert, oriented x 3 (person, place, & time)       Respiratory: No evidence of acute respiratory distress Eyes: PERLA Vitals: BP (!) 154/100   Pulse 86   Temp (!) 97.3 F (36.3 C) (Temporal)   Resp 14   Ht $R'5\' 6"'xA$  (1.676 m)   Wt 141 lb (64 kg)   SpO2 99%   BMI 22.76 kg/m  BMI: Estimated body mass index is 22.76 kg/m as calculated from the following:   Height as of this encounter: $RemoveBeforeD'5\' 6"'ucubKnwBNsMuDS$  (1.676 m).   Weight as of this encounter: 141 lb (64 kg). Ideal: Ideal body weight: 59.3 kg (130 lb 11.7 oz) Adjusted ideal body weight: 61.2 kg (134 lb 13.4 oz)  Assessment   Status Diagnosis  Persistent Improving Improved 1. Chronic hip pain (Bilateral) (L>R)   2. Chronic low back pain (2ry area of Pain) (Bilateral) (L>R) w/o sciatica   3. Chronic sacroiliac joint pain (Bilateral) (L>R)   4. Chronic sacroiliac joint pain (Right)      Updated Problems: No problems updated. Plan of Care  Problem-specific:  No problem-specific Assessment & Plan notes found for this encounter.  Valerie Raymond has a current medication list which includes the following long-term medication(s): atorvastatin, calcium carbonate-vit d-min, and pantoprazole.  Pharmacotherapy  (Medications Ordered): No orders of the defined types were placed in this encounter.  Orders:  Orders Placed This Encounter  Procedures  . MR HIP RIGHT WO CONTRAST    Standing Status:   Future    Number of Occurrences:   1    Standing Expiration Date:   07/11/2020    Order Specific Question:   What is the patient's sedation requirement?    Answer:   No Sedation    Order Specific Question:   Does the patient have a pacemaker or implanted devices?    Answer:   No    Order Specific Question:   Preferred imaging location?    Answer:   [facility] (table limit-350lbs)    Order Specific Question:   Radiology Contrast Protocol - do NOT remove file path    Answer:   \\[email or web address]  . MR HIP LEFT WO CONTRAST    Standing Status:   Future    Number of Occurrences:   1    Standing Expiration Date:   07/11/2020    Order Specific Question:   What is the patient's sedation requirement?    Answer:   No Sedation    Order Specific Question:   Does the patient have a pacemaker or implanted devices?    Answer:   No    Order Specific Question:   Preferred imaging location?    Answer:   [facility] (table limit-350lbs)    Order Specific Question:  Radiology Contrast Protocol - do NOT remove file path    Answer:   \\[email or web address]   Follow-up plan:   Return for (20-min), (F2F), (s/p Tests).      Interventional management options: Planned, scheduled, and/or pending:      Considering:   Diagnostic right IA hip joint injection #1  Possible bilateral SI RFA #1    Palliative PRN treatment(s):   Diagnostic bilateral sacroiliac joint block #2 (L: 100/100/0) (R: 0/0/0) (04/25/2020) Palliative left gluteofemoral bursa injection #2 (03/23/2020) Palliative left trochanteric bursa injection #2 (100/50/50) (08/17/2019)  Palliative left IA hip joint injection #3 (100/100/0) (09/07/2019; 03/23/2020) Palliative bilateral lumbar facet  block #3 (100/90/50) (09/28/2019; 10/21/2019) Palliative left lumbar facet RFA #2 (last done 12/23/2019) (100/0/90% for the top of the leg and 50% for the lateral aspect of the leg) Palliative right lumbar facet RFA #2 (last done 02/01/2020) (100/100/55)    Recent Visits Date Type Provider Dept  05/11/20 Office Visit Milinda Pointer, MD [facility]  04/25/20 Procedure visit Milinda Pointer, MD [facility]  04/11/20 Office Visit Milinda Pointer, MD [facility]  03/23/20 Procedure visit Milinda Pointer, MD [facility]  Showing recent visits within past 90 days and meeting all other requirements Future Appointments Date Type Provider Dept  06/19/20 Appointment Milinda Pointer, MD [facility]  Showing future appointments within next 90 days and meeting all other requirements  I discussed the assessment and treatment plan with the patient. The patient was provided an opportunity to ask questions and all were answered. The patient agreed with the plan and demonstrated an understanding of the instructions.  Patient advised to call back or seek an in-person evaluation if the symptoms or condition worsens.  Duration of encounter: 30 minutes.  Note by: Gaspar Cola, MD Date: 05/11/2020; Time: 3:43 PM

## 2020-05-11 NOTE — Progress Notes (Signed)
Safety precautions to be maintained throughout the outpatient stay will include: orient to surroundings, keep bed in low position, maintain call bell within reach at all times, provide assistance with transfer out of bed and ambulation.  

## 2020-05-22 DIAGNOSIS — G8929 Other chronic pain: Secondary | ICD-10-CM | POA: Insufficient documentation

## 2020-05-22 DIAGNOSIS — M533 Sacrococcygeal disorders, not elsewhere classified: Secondary | ICD-10-CM | POA: Insufficient documentation

## 2020-05-31 ENCOUNTER — Ambulatory Visit
Admission: RE | Admit: 2020-05-31 | Discharge: 2020-05-31 | Disposition: A | Discharge status: Home / Self Care | Payer: Medicare Other | Source: Ambulatory Visit | Attending: Pain Medicine | Admitting: Pain Medicine

## 2020-05-31 ENCOUNTER — Other Ambulatory Visit: Payer: Self-pay

## 2020-05-31 DIAGNOSIS — M25551 Pain in right hip: Secondary | ICD-10-CM | POA: Insufficient documentation

## 2020-05-31 DIAGNOSIS — G8929 Other chronic pain: Secondary | ICD-10-CM | POA: Diagnosis not present

## 2020-05-31 DIAGNOSIS — M25552 Pain in left hip: Secondary | ICD-10-CM | POA: Diagnosis not present

## 2020-05-31 DIAGNOSIS — S73191A Other sprain of right hip, initial encounter: Secondary | ICD-10-CM | POA: Diagnosis not present

## 2020-06-06 ENCOUNTER — Other Ambulatory Visit: Payer: Self-pay | Admitting: Pain Medicine

## 2020-06-07 ENCOUNTER — Ambulatory Visit (INDEPENDENT_AMBULATORY_CARE_PROVIDER_SITE_OTHER): Payer: Medicare Other | Admitting: Internal Medicine

## 2020-06-07 ENCOUNTER — Other Ambulatory Visit: Payer: Self-pay

## 2020-06-07 ENCOUNTER — Encounter: Payer: Self-pay | Admitting: Internal Medicine

## 2020-06-07 VITALS — BP 120/76 | HR 79 | Temp 97.8°F | Ht 66.0 in | Wt 143.0 lb

## 2020-06-07 DIAGNOSIS — E785 Hyperlipidemia, unspecified: Secondary | ICD-10-CM

## 2020-06-07 DIAGNOSIS — M25552 Pain in left hip: Secondary | ICD-10-CM | POA: Diagnosis not present

## 2020-06-07 DIAGNOSIS — R030 Elevated blood-pressure reading, without diagnosis of hypertension: Secondary | ICD-10-CM

## 2020-06-07 DIAGNOSIS — G8929 Other chronic pain: Secondary | ICD-10-CM

## 2020-06-07 DIAGNOSIS — M25551 Pain in right hip: Secondary | ICD-10-CM | POA: Diagnosis not present

## 2020-06-07 DIAGNOSIS — Z23 Encounter for immunization: Secondary | ICD-10-CM | POA: Diagnosis not present

## 2020-06-07 NOTE — Progress Notes (Signed)
Date:  06/07/2020   Name:  Valerie Raymond   DOB:  [birth year]   MRN:  [id number]   Chief Complaint: Hypertension (follow up ) and Flu Vaccine  Hypertension This is a new problem. The problem has been gradually improving since onset. Pertinent negatives include no chest pain, headaches, palpitations or shortness of breath. Past treatments include lifestyle changes.  Hyperlipidemia The current episode started more than 1 year ago. Recent lipid tests were reviewed and are high. Pertinent negatives include no chest pain or shortness of breath. Current antihyperlipidemic treatment includes statins (started last visit).  Hip Pain  There was no injury mechanism. The pain is present in the right hip and left hip (L>R). The pain is moderate. The pain has been worsening since onset. Treatments tried: MRI of hips done - has f/u next week.    Lab Results  Component Value Date   CREATININE 0.74 04/06/2020   BUN 9 04/06/2020   NA 141 04/06/2020   K 4.6 04/06/2020   CL 103 04/06/2020   CO2 23 04/06/2020   Lab Results  Component Value Date   CHOL 213 (H) 04/06/2020   HDL 76 04/06/2020   LDLCALC 123 (H) 04/06/2020   TRIG 81 04/06/2020   CHOLHDL 2.8 04/06/2020   Lab Results  Component Value Date   TSH 1.220 04/06/2020   No results found for: HGBA1C Lab Results  Component Value Date   WBC 8.3 04/06/2020   HGB 13.6 04/06/2020   HCT 39.6 04/06/2020   MCV 85 04/06/2020   PLT 329 04/06/2020   Lab Results  Component Value Date   ALT 15 04/06/2020   AST 22 04/06/2020   ALKPHOS 79 04/06/2020   BILITOT 0.3 04/06/2020     Review of Systems  Constitutional: Negative for fatigue.  Respiratory: Negative for chest tightness and shortness of breath.   Cardiovascular: Negative for chest pain, palpitations and leg swelling.  Musculoskeletal: Positive for arthralgias and back pain.  Neurological: Negative for dizziness and headaches.    Patient Active Problem List   Diagnosis Date Noted   . Chronic sacroiliac joint pain (Right) 05/22/2020  . Other spondylosis, sacral and sacrococcygeal region 04/25/2020  . Chronic sacroiliac joint pain (Bilateral) (L>R) 04/11/2020  . Enthesopathy of sacroiliac joint (Bilateral) 04/11/2020  . Somatic dysfunction of sacroiliac joints (Bilateral) 04/11/2020  . Osteoarthritis of hip (Left) 03/22/2020  . Spondylosis without myelopathy or radiculopathy, lumbosacral region 09/28/2019  . Trochanteric bursitis of hips (Bilateral) (L>R) 08/17/2019  . Chronic thigh pain (1ry area of Pain) (Left) 08/04/2019  . Lower extremity burning sensation (thigh) (Left) 08/04/2019  . Greater trochanteric bursitis of hip (Left) 08/04/2019  . Lumbar facet syndrome (Bilateral) (L>R) 08/04/2019  . Chronic pain syndrome 08/03/2019  . Problems influencing health status 08/03/2019  . Abnormal MRI, lumbar spine (06/29/2019) 08/03/2019  . Grade 1 Lumbar Retrolisthesis L3/L4 08/03/2019  . DDD (degenerative disc disease), lumbosacral 08/03/2019  . Lumbosacral intervertebral disc displacement (IVDD) 08/03/2019  . Lumbar facet arthropathy (Multilevel) (Bilateral) 08/03/2019  . Lumbosacral foraminal stenosis (Bilateral) (L5-S1) 08/03/2019  . Lumbar lateral recess stenosis (Left) (L2-3) 08/03/2019  . Chronic lower extremity pain (3ry area of Pain) (Bilateral) (L>R) 08/03/2019  . Chronic low back pain (2ry area of Pain) (Bilateral) (L>R) w/o sciatica 08/03/2019  . Chronic foot pain (Right) 08/03/2019  . Chronic lumbar radiculitis 08/03/2019  . Chronic musculoskeletal pain 08/03/2019  . Hyperlipidemia, mild 05/22/2018  . Lumbar radiculopathy 07/16/2017  . Stricture and stenosis of esophagus   .  Lumbar pain with radiation down legs (Bilateral) 06/05/2017  . Dysphagia 03/21/2017  . Tonsillith 10/09/2016  . Family history of breast cancer in first degree relative 01/02/2016  . Status post total abdominal hysterectomy and bilateral salpingo-oophorectomy (TAH-BSO) 01/02/2016    . Vaginal atrophy 01/02/2016  . Incomplete bladder emptying 01/02/2016  . Midline cystocele 01/02/2016  . Colon, diverticulosis 07/23/2015  . Chronic hip pain (Bilateral) (L>R) 07/23/2015  . Arthritis of knee, degenerative 07/23/2015  . Other osteoporosis without current pathological fracture 07/23/2015  . Avitaminosis D 07/23/2015    Allergies  Allergen Reactions  . Lodine [Etodolac] Diarrhea and Nausea And Vomiting    Past Surgical History:  Procedure Laterality Date  . ABDOMINAL HYSTERECTOMY  1992  . COLONOSCOPY  2006  . COLONOSCOPY N/A 01/23/2015   Procedure: COLONOSCOPY;  Surgeon: Lucilla Lame, MD;  Location: [place];  Service: Gastroenterology;  Laterality: N/A;  cecum- [id number]  . ESOPHAGEAL DILATION  04/21/2017   Procedure: ESOPHAGEAL DILATION;  Surgeon: Lucilla Lame, MD;  Location: [place];  Service: Endoscopy;;  . ESOPHAGEAL DILATION  07/07/2017   Procedure: ESOPHAGEAL DILATION;  Surgeon: Lucilla Lame, MD;  Location: [place];  Service: Endoscopy;;  . ESOPHAGEAL DILATION N/A 07/28/2017   Procedure: ESOPHAGEAL DILATION;  Surgeon: Lucilla Lame, MD;  Location: [place];  Service: Endoscopy;  Laterality: N/A;  . ESOPHAGOGASTRODUODENOSCOPY N/A 04/21/2017   Procedure: ESOPHAGOGASTRODUODENOSCOPY (EGD);  Surgeon: Lucilla Lame, MD;  Location: [place];  Service: Endoscopy;  Laterality: N/A;  . ESOPHAGOGASTRODUODENOSCOPY (EGD) WITH PROPOFOL N/A 07/07/2017   Procedure: ESOPHAGOGASTRODUODENOSCOPY (EGD) WITH PROPOFOL;  Surgeon: Lucilla Lame, MD;  Location: [place];  Service: Endoscopy;  Laterality: N/A;  . ESOPHAGOGASTRODUODENOSCOPY (EGD) WITH PROPOFOL N/A 07/28/2017   Procedure: ESOPHAGOGASTRODUODENOSCOPY (EGD) WITH PROPOFOL;  Surgeon: Lucilla Lame, MD;  Location: [place];  Service: Endoscopy;  Laterality: N/A;  . SALPINGOOPHORECTOMY Bilateral     Social History   Tobacco Use  . Smoking status: Never Smoker   . Smokeless tobacco: Never Used  . Tobacco comment: smoking cessation materials not required  Vaping Use  . Vaping Use: Never used  Substance Use Topics  . Alcohol use: No    Alcohol/week: 0.0 standard drinks  . Drug use: No     Medication list has been reviewed and updated.  Current Meds  Medication Sig  . alendronate (FOSAMAX) 70 MG tablet TAKE 1 TABLET BY MOUTH ONCE A WEEK WITH A GLASS OF WATER ON AN EMPTY STOMACH AND REMAIN UPRIGHT WITHOUT EATING FOR 30 MINUTES  . ARTIFICIAL TEARS 1 % ophthalmic solution   . atorvastatin (LIPITOR) 10 MG tablet Take 1 tablet (10 mg total) by mouth daily.  . Calcium Carbonate-Vit D-Min (CALCIUM 1200 PO) Take 2 tablets by mouth daily.   . Cholecalciferol (VITAMIN D3) 1000 UNITS CAPS Take 2 capsules by mouth daily. am  . meloxicam (MOBIC) 15 MG tablet TAKE 1 TABLET(15 MG) BY MOUTH DAILY (Patient taking differently: as needed. )  . OXYQUINOLONE SULFATE VAGINAL (TRIMO-SAN) 0.025 % GEL Place 1 Applicatorful vaginally once a week.  . pantoprazole (PROTONIX) 40 MG tablet TAKE 1 TABLET(40 MG) BY MOUTH DAILY IN THE MORNING    PHQ 2/9 Scores 05/11/2020 04/25/2020 04/17/2020 04/06/2020  PHQ - 2 Score 0 0 0 0  PHQ- 9 Score - - - 0  Exception Documentation - - - -    GAD 7 : Generalized Anxiety Score 04/06/2020  Nervous, Anxious, on Edge 0  Control/stop worrying 0  Worry too  much - different things 0  Trouble relaxing 0  Restless 0  Easily annoyed or irritable 0  Afraid - awful might happen 0  Total GAD 7 Score 0  Anxiety Difficulty Not difficult at all    BP Readings from Last 3 Encounters:  06/07/20 120/76  05/11/20 (!) 154/100  04/25/20 (!) 151/80    Physical Exam Constitutional:      Appearance: Normal appearance.  Cardiovascular:     Rate and Rhythm: Normal rate and regular rhythm.     Pulses: Normal pulses.  Pulmonary:     Effort: Pulmonary effort is normal. No respiratory distress.  Musculoskeletal:     Cervical back: Normal range of  motion.     Right lower leg: No edema.     Left lower leg: No edema.  Neurological:     General: No focal deficit present.     Mental Status: She is alert.  Psychiatric:        Mood and Affect: Mood normal.      Wt Readings from Last 3 Encounters:  06/07/20 143 lb (64.9 kg)  05/11/20 141 lb (64 kg)  04/25/20 142 lb (64.4 kg)    BP 120/76 (BP Location: Right Arm, Patient Position: Sitting)   Pulse 79   Temp 97.8 F (36.6 C) (Oral)   Ht 5\' 6"  (1.676 m)   Wt 143 lb (64.9 kg)   SpO2 99%   BMI 23.08 kg/m   Assessment and Plan: 1. Elevated BP without diagnosis of hypertension Much improved with lifestyle changes, limiting sodium, etc Continue to monitor  2. Hyperlipidemia, mild Now on statin therapy for elevated risk of CAD No side effects to medication noted Labs next visit  3. Chronic hip pain (Bilateral) (L>R) With extensive changes on MRI Awaiting recommendations from pain management   Partially dictated using Dragon software. Any errors are unintentional.  Halina Maidens, MD [facility]  06/07/2020

## 2020-06-17 NOTE — Progress Notes (Signed)
PROVIDER NOTE: Information contained herein reflects review and annotations entered in association with encounter. Interpretation of such information and data should be left to medically-trained personnel. Information provided to patient can be located elsewhere in the medical record under "Patient Instructions". Document created using STT-dictation technology, any transcriptional errors that may result from process are unintentional.    Patient: Valerie Raymond  Service Category: E/M  Provider: Gaspar Cola, MD  DOB: 1951/07/15  DOS: 06/19/2020  Specialty: Interventional Pain Management  MRN: [id number]  Setting: Ambulatory outpatient  PCP: Valerie Hess, MD  Type: Established Patient    Referring Provider: [name], MD  Location: Office  Delivery: Face-to-face     HPI  [name], a 69 y.o. year old female, is here today because of her Chronic hip pain, bilateral [M25.551, M25.552, G89.29]. Valerie Raymond primary complain today is Hip Pain (left is worse) Last encounter: My last encounter with her was on 05/11/2020. Pertinent problems: Valerie Raymond has Chronic hip pain (Bilateral) (L>R); Arthritis of knee, degenerative; Other osteoporosis without current pathological fracture; Lumbar pain with radiation down legs (Bilateral); Lumbar radiculopathy; Chronic pain syndrome; Abnormal MRI, lumbar spine (06/29/2019); Grade 1 Lumbar Retrolisthesis L3/L4; DDD (degenerative disc disease), lumbosacral; Lumbosacral intervertebral disc displacement (IVDD); Lumbar facet arthropathy (Multilevel) (Bilateral); Lumbosacral foraminal stenosis (Bilateral) (L5-S1); Lumbar lateral recess stenosis (Left) (L2-3); Chronic lower extremity pain (3ry area of Pain) (Bilateral) (L>R); Chronic low back pain (2ry area of Pain) (Bilateral) (L>R) w/o sciatica; Chronic foot pain (Right); Chronic lumbar radiculitis; Chronic musculoskeletal pain; Chronic thigh pain (1ry area of Pain) (Left); Lower extremity burning  sensation (thigh) (Left); Greater trochanteric bursitis of hip (Left); Lumbar facet syndrome (Bilateral) (L>R); Trochanteric bursitis of hips (Bilateral) (L>R); Spondylosis without myelopathy or radiculopathy, lumbosacral region; Osteoarthritis of hip (Left); Chronic sacroiliac joint pain (Bilateral) (L>R); Enthesopathy of sacroiliac joint (Bilateral); Somatic dysfunction of sacroiliac joints (Bilateral); Other spondylosis, sacral and sacrococcygeal region; Chronic sacroiliac joint pain (Right); Labral tear of hip, degenerative (Bilateral); and Abnormal MRI, hip joint (Bilateral) (05/31/2020) on their pertinent problem list. Pain Assessment: Severity of Chronic pain is reported as a 4 /10. Location: Hip Right, Left (left is worse)/radiates from left hip to groin and then down left leg on the side to knee. Onset: More than a month ago. Quality: Burning, Aching, Stabbing. Timing: Constant. Modifying factor(s): rest. Vitals:  height is 5\' 6"  (1.676 m) and weight is 131 lb (59.4 kg). Her temperature is 97.4 F (36.3 C) (abnormal). Her blood pressure is 148/96 (abnormal) and her pulse is 83. Her respiration is 18 and oxygen saturation is 100%.   Reason for encounter: follow-up evaluation after hip MRI.  Today I went over the results of the MRI with the patient.  We discussed possible alternatives and she has decided to go with a referral to Valerie Raymond at the [facility] orthopedic department for surgical evaluation.  She indicated taking the meloxicam but she states that it is not touching the pain.  Today we talked about some alternatives including trying some tramadol 50 mg tablets, 1 tablet p.o. every 6-8 hours.  She indicates that her worst pain is at night and that she would probably be taking at at bedtime.  She also indicated that she has some hydrocodone left from the radiofrequency ablation.  We have decided to go ahead and use that until she finishes them.  She indicated that she would be taking  it at at bedtime.  I took the time to  talk to her about the fact that those two are opioids and that they have the potential for addiction and drug to drug interactions which may cause respiratory depression and death.  She indicated being aware of this and being careful on how she uses it.  The patient was told to give [place] a call if she needs to have some hip injections done to help with the pain until she can get to the orthopedic surgeon for a more permanent solution.  The plan was discussed with the patient who understood and accepted.  MRI of the hips revealed: 1. Severe degeneration of the right superior labrum with a small superior anterior labral tear and 5 mm paralabral cyst. 2. Extensive left anterior labral tear. 3. No hip fracture, dislocation or avascular necrosis.  Pharmacotherapy Assessment   Analgesic: No opioid analgesics prescribed by our practice.   Monitoring: [place] PMP: PDMP reviewed during this encounter.       Pharmacotherapy: No side-effects or adverse reactions reported. Compliance: No problems identified. Effectiveness: Clinically acceptable.  Valerie Shorter, RN  06/19/2020  8:33 AM  Signed Safety precautions to be maintained throughout the outpatient stay will include: orient to surroundings, keep bed in low position, maintain call bell within reach at all times, provide assistance with transfer out of bed and ambulation.    UDS: No results found for: SUMMARY   ROS  Constitutional: Denies any fever or chills Gastrointestinal: No reported hemesis, hematochezia, vomiting, or acute GI distress Musculoskeletal: Denies any acute onset joint swelling, redness, loss of ROM, or weakness Neurological: No reported episodes of acute onset apraxia, aphasia, dysarthria, agnosia, amnesia, paralysis, loss of coordination, or loss of consciousness  Medication Review  Calcium Carbonate-Vit D-Min, OXYQUINOLONE SULFATE VAGINAL, Vitamin D3, alendronate, atorvastatin,  carboxymethylcellulose, meloxicam, and pantoprazole  History Review  Allergy: Valerie Raymond is allergic to lodine [etodolac]. Drug: Valerie Raymond  reports no history of drug use. Alcohol:  reports no history of alcohol use. Tobacco:  reports that she has never smoked. She has never used smokeless tobacco. Social: Ms. Moffat  reports that she has never smoked. She has never used smokeless tobacco. She reports that she does not drink alcohol and does not use drugs. Medical:  has a past medical history of Arthralgia of hip (07/23/2015), Arthritis, Degenerative disc disease, lumbar, DJD (degenerative joint disease), Dysphagia, Endometriosis, GERD (gastroesophageal reflux disease), Heartburn, Joint pain, Osteoporosis, Pelvic pressure in female (01/02/2016), Rash, and Vitamin D deficiency. Surgical: Ms. Viramontes  has a past surgical history that includes Colonoscopy (2006); Colonoscopy (N/A, 01/23/2015); Abdominal hysterectomy (1992); Salpingoophorectomy (Bilateral); Esophagogastroduodenoscopy (N/A, 04/21/2017); Esophageal dilation (04/21/2017); Esophagogastroduodenoscopy (egd) with propofol (N/A, 07/07/2017); Esophageal dilation (07/07/2017); Esophagogastroduodenoscopy (egd) with propofol (N/A, 07/28/2017); and Esophageal dilation (N/A, 07/28/2017). Family: family history includes Breast cancer in her cousin and maternal aunt; Breast cancer (age of onset: 71) in her sister; Breast cancer (age of onset: 45) in her sister; Colon cancer in her paternal uncle; Pancreatic cancer in her mother; Prostate cancer in her brother and father.  Laboratory Chemistry Profile   Renal Lab Results  Component Value Date   BUN 9 04/06/2020   CREATININE 0.74 04/06/2020   BCR 12 04/06/2020   GFRAA 96 04/06/2020   GFRNONAA 84 04/06/2020     Hepatic Lab Results  Component Value Date   AST 22 04/06/2020   ALT 15 04/06/2020   ALBUMIN 4.6 04/06/2020   ALKPHOS 79 04/06/2020     Electrolytes Lab Results  Component Value Date   NA 141  04/06/2020   K 4.6 04/06/2020   CL 103 04/06/2020   CALCIUM 10.0 04/06/2020   MG 2.3 08/04/2019     Bone Lab Results  Component Value Date   VD25OH 20.8 (L) 04/06/2020   25OHVITD1 39 08/04/2019   25OHVITD2 <1.0 08/04/2019   25OHVITD3 39 08/04/2019     Inflammation (CRP: Acute Phase) (ESR: Chronic Phase) Lab Results  Component Value Date   CRP 3 08/04/2019   ESRSEDRATE 12 08/04/2019       Note: Above Lab results reviewed.  Recent Imaging Review  MR HIP LEFT WO CONTRAST CLINICAL DATA:  Chronic bilateral hip pain  EXAM: MR OF THE RIGHT HIP WITHOUT CONTRAST  MR OF THE LEFT HIP WITHOUT CONTRAST  TECHNIQUE: Multiplanar, multisequence MR imaging of the right hip was performed. No intravenous contrast was administered.  Multiplanar, multisequence MR imaging of left hip was performed. No intravenous contrast was administered.  COMPARISON:  None.  FINDINGS: Bones:  No hip fracture, dislocation or avascular necrosis. No periosteal reaction or bone destruction. No aggressive osseous lesion.  Normal sacrum and sacroiliac joints. No SI joint widening or erosive changes.  Degenerative disease with disc height loss at L4-5 and L5-S1. Bilateral facet arthropathy at L4-5 and L5-S1.  Articular cartilage and labrum  Articular cartilage:  No chondral defect.  Labrum: Severe degeneration of the right superior labrum with a small superior anterior labral tear and 5 mm paralabral cyst. Extensive left anterior labral tear.  Joint or bursal effusion  Joint effusion:  No hip joint effusion.  No SI joint effusion.  Bursae:  No bursa formation.  Muscles and tendons  Flexors: Normal.  Extensors: Normal.  Abductors: Normal.  Adductors: Normal.  Gluteals: Normal.  Hamstrings: Normal.  Other findings  No pelvic free fluid. No fluid collection or hematoma. No inguinal lymphadenopathy. No inguinal hernia.  IMPRESSION: 1. Severe degeneration of the right superior  labrum with a small superior anterior labral tear and 5 mm paralabral cyst. 2. Extensive left anterior labral tear. 3. No hip fracture, dislocation or avascular necrosis.  Electronically Signed   By: Elige Ko   On: 05/31/2020 14:47 MR HIP RIGHT WO CONTRAST CLINICAL DATA:  Chronic bilateral hip pain  EXAM: MR OF THE RIGHT HIP WITHOUT CONTRAST  MR OF THE LEFT HIP WITHOUT CONTRAST  TECHNIQUE: Multiplanar, multisequence MR imaging of the right hip was performed. No intravenous contrast was administered.  Multiplanar, multisequence MR imaging of left hip was performed. No intravenous contrast was administered.  COMPARISON:  None.  FINDINGS: Bones:  No hip fracture, dislocation or avascular necrosis. No periosteal reaction or bone destruction. No aggressive osseous lesion.  Normal sacrum and sacroiliac joints. No SI joint widening or erosive changes.  Degenerative disease with disc height loss at L4-5 and L5-S1. Bilateral facet arthropathy at L4-5 and L5-S1.  Articular cartilage and labrum  Articular cartilage:  No chondral defect.  Labrum: Severe degeneration of the right superior labrum with a small superior anterior labral tear and 5 mm paralabral cyst. Extensive left anterior labral tear.  Joint or bursal effusion  Joint effusion:  No hip joint effusion.  No SI joint effusion.  Bursae:  No bursa formation.  Muscles and tendons  Flexors: Normal.  Extensors: Normal.  Abductors: Normal.  Adductors: Normal.  Gluteals: Normal.  Hamstrings: Normal.  Other findings  No pelvic free fluid. No fluid collection or hematoma. No inguinal lymphadenopathy. No inguinal hernia.  IMPRESSION: 1. Severe degeneration of the right superior labrum with a small  superior anterior labral tear and 5 mm paralabral cyst. 2. Extensive left anterior labral tear. 3. No hip fracture, dislocation or avascular necrosis.  Electronically Signed   By: Elige Ko   On:  05/31/2020 14:47 Note: Reviewed        Physical Exam  General appearance: Well nourished, well developed, and well hydrated. In no apparent acute distress Mental status: Alert, oriented x 3 (person, place, & time)       Respiratory: No evidence of acute respiratory distress Eyes: PERLA Vitals: BP (!) 148/96   Pulse 83   Temp (!) 97.4 F (36.3 C)   Resp 18   Ht 5\' 6"  (1.676 m)   Wt 131 lb (59.4 kg)   SpO2 100%   BMI 21.14 kg/m  BMI: Estimated body mass index is 21.14 kg/m as calculated from the following:   Height as of this encounter: 5\' 6"  (1.676 m).   Weight as of this encounter: 131 lb (59.4 kg). Ideal: Ideal body weight: 59.3 kg (130 lb 11.7 oz) Adjusted ideal body weight: 59.3 kg (130 lb 13.4 oz)  Assessment   Status Diagnosis  Controlled Controlled Controlled 1. Chronic hip pain (Bilateral) (L>R)   2. Labral tear of hip, degenerative (Bilateral)   3. Abnormal MRI, hip joint (Bilateral)      Updated Problems: Problem  Labral tear of hip, degenerative (Bilateral)  Abnormal MRI, hip joint (Bilateral) (05/31/2020)   MRI of the hips revealed: 1. Severe degeneration of the right superior labrum with a small superior anterior labral tear and 5 mm paralabral cyst. 2. Extensive left anterior labral tear. 3. No hip fracture, dislocation or avascular necrosis.     Plan of Care  Problem-specific:  No problem-specific Assessment & Plan notes found for this encounter.  Ms. Harley Mccartney has a current medication list which includes the following long-term medication(s): atorvastatin, calcium carbonate-vit d-min, and pantoprazole.  Pharmacotherapy (Medications Ordered): No orders of the defined types were placed in this encounter.  Orders:  Orders Placed This Encounter  Procedures  . Ambulatory referral to Orthopedic Surgery    Referral Priority:   Routine    Referral Type:   Surgical    Referral Reason:   Specialty Services Required    Referred to Provider:    06/02/2020, MD    Requested Specialty:   Orthopedic Surgery    Number of Visits Requested:   1   Follow-up plan:   Return if symptoms worsen or fail to improve.      Interventional management options: Planned, scheduled, and/or pending:      Considering:   Diagnostic right IA hip joint injection #1  Possible bilateral SI RFA #1    Palliative PRN treatment(s):   Diagnostic bilateral sacroiliac joint block #2 (L: 100/100/0) (R: 0/0/0) (04/25/2020) Palliative left gluteofemoral bursa injection #2 (03/23/2020) Palliative left trochanteric bursa injection #2 (100/50/50) (08/17/2019)  Palliative left IA hip joint injection #3 (100/100/0) (09/07/2019; 03/23/2020) Palliative bilateral lumbar facet block #3 (100/90/50) (09/28/2019; 10/21/2019) Palliative left lumbar facet RFA #2 (last done 12/23/2019) (100/0/90% for the top of the leg and 50% for the lateral aspect of the leg) Palliative right lumbar facet RFA #2 (last done 02/01/2020) (100/100/55)     Recent Visits Date Type Provider Dept  05/11/20 Office Visit 02/03/2020, MD [facility]  04/25/20 Procedure visit Delano Metz, MD [facility]  04/11/20 Office Visit Delano Metz, MD [facility]  03/23/20 Procedure visit Delano Metz, MD [facility]  Showing recent visits within past 90 days and meeting all other requirements Today's Visits Date Type Provider Dept  06/19/20 Office Visit Milinda Pointer, MD [facility]  Showing today's visits and meeting all other requirements Future Appointments No visits were found meeting these conditions. Showing future appointments within next 90 days and meeting all other requirements  I discussed the assessment and treatment plan with the patient. The patient was provided an opportunity to ask questions and all were answered. The patient agreed with the plan and demonstrated an understanding of the instructions.  Patient  advised to call back or seek an in-person evaluation if the symptoms or condition worsens.  Duration of encounter: 35 minutes.  Note by: Valerie Cola, MD Date: 06/19/2020; Time: 9:23 AM

## 2020-06-19 ENCOUNTER — Other Ambulatory Visit: Payer: Self-pay

## 2020-06-19 ENCOUNTER — Encounter: Payer: Self-pay | Admitting: Pain Medicine

## 2020-06-19 ENCOUNTER — Ambulatory Visit: Payer: Medicare Other | Attending: Pain Medicine | Admitting: Pain Medicine

## 2020-06-19 VITALS — BP 148/96 | HR 83 | Temp 97.4°F | Resp 18 | Ht 66.0 in | Wt 131.0 lb

## 2020-06-19 DIAGNOSIS — G8929 Other chronic pain: Secondary | ICD-10-CM | POA: Insufficient documentation

## 2020-06-19 DIAGNOSIS — M25552 Pain in left hip: Secondary | ICD-10-CM | POA: Diagnosis not present

## 2020-06-19 DIAGNOSIS — M25551 Pain in right hip: Secondary | ICD-10-CM | POA: Insufficient documentation

## 2020-06-19 DIAGNOSIS — R9389 Abnormal findings on diagnostic imaging of other specified body structures: Secondary | ICD-10-CM | POA: Insufficient documentation

## 2020-06-19 DIAGNOSIS — M24159 Other articular cartilage disorders, unspecified hip: Secondary | ICD-10-CM | POA: Diagnosis not present

## 2020-06-19 HISTORY — DX: Abnormal findings on diagnostic imaging of other specified body structures: R93.89

## 2020-06-19 NOTE — Progress Notes (Signed)
Safety precautions to be maintained throughout the outpatient stay will include: orient to surroundings, keep bed in low position, maintain call bell within reach at all times, provide assistance with transfer out of bed and ambulation.  

## 2020-07-24 DIAGNOSIS — M1612 Unilateral primary osteoarthritis, left hip: Secondary | ICD-10-CM | POA: Diagnosis not present

## 2020-07-24 DIAGNOSIS — S73102A Unspecified sprain of left hip, initial encounter: Secondary | ICD-10-CM | POA: Diagnosis not present

## 2020-07-27 ENCOUNTER — Other Ambulatory Visit: Payer: Self-pay | Admitting: Internal Medicine

## 2020-07-27 NOTE — Telephone Encounter (Signed)
Requested Prescriptions  Pending Prescriptions Disp Refills  . pantoprazole (PROTONIX) 40 MG tablet [Pharmacy Med Name: PANTOPRAZOLE 40MG  TABLETS] 90 tablet 3    Sig: TAKE 1 TABLET(40 MG) BY MOUTH DAILY IN THE MORNING     Gastroenterology: Proton Pump Inhibitors Passed - 07/27/2020  5:21 PM      Passed - Valid encounter within last 12 months    Recent Outpatient Visits          1 month ago Elevated BP without diagnosis of hypertension   [facility] [name], MD   3 months ago Annual physical exam   [facility] [name], MD   1 year ago Lumbar radiculopathy   [facility] [name], MD   1 year ago Annual physical exam   [facility] [name], MD   1 year ago Foot pain, right   [facility] [name], MD      Future Appointments            In 2 weeks Army Melia Jesse Sans, MD [facility] [place]   In 8 months Army Melia, Jesse Sans, MD [facility], [facility]

## 2020-08-02 ENCOUNTER — Encounter: Payer: Self-pay | Admitting: Orthopedic Surgery

## 2020-08-02 ENCOUNTER — Other Ambulatory Visit: Payer: Self-pay | Admitting: Orthopedic Surgery

## 2020-08-02 NOTE — H&P (Signed)
NAME: Valerie Raymond MRN:   [id number] DOB:   [birth year]     HISTORY AND PHYSICAL  CHIEF COMPLAINT:  Left hip pain  HISTORY:   [facility] a 69 y.o. female  with left  Hip Pain Patient complains of left hip pain. Onset of the symptoms was several years ago. Inciting event: none. The patient reports the hip pain is worse with weight bearing. Associated symptoms: none. Aggravating symptoms include: any weight bearing and going up and down stairs. Patient has had no prior hip problems. Previous visits for this problem: yes, last seen several weeks ago by me. Evaluation to date: plain films, which were abnormal  severe osteoarthritis. Treatment to date: OTC analgesics, which have been somewhat effective, prescription analgesics, which have been somewhat effective, home exercise program, which has been somewhat effective and physical therapy, which has been somewhat effective.  Plan for left total hip replacement  PAST MEDICAL HISTORY:   Past Medical History:  Diagnosis Date  . Arthralgia of hip 07/23/2015  . Arthritis    knee,hip,hands  . Degenerative disc disease, lumbar   . DJD (degenerative joint disease)   . Dysphagia    with solids and regurgitation  . Endometriosis    h/o  . GERD (gastroesophageal reflux disease)    occasional  . Heartburn   . Joint pain   . Osteoporosis    hip  . Pelvic pressure in female 01/02/2016  . Rash    skin rash and itching  . Vitamin D deficiency     PAST SURGICAL HISTORY:   Past Surgical History:  Procedure Laterality Date  . ABDOMINAL HYSTERECTOMY  1992  . COLONOSCOPY  2006  . COLONOSCOPY N/A 01/23/2015   Procedure: COLONOSCOPY;  Surgeon: Lucilla Lame, MD;  Location: [place];  Service: Gastroenterology;  Laterality: N/A;  cecum- [id number]  . ESOPHAGEAL DILATION  04/21/2017   Procedure: ESOPHAGEAL DILATION;  Surgeon: Lucilla Lame, MD;  Location: [place];  Service: Endoscopy;;  . ESOPHAGEAL DILATION  07/07/2017   Procedure:  ESOPHAGEAL DILATION;  Surgeon: Lucilla Lame, MD;  Location: [place];  Service: Endoscopy;;  . ESOPHAGEAL DILATION N/A 07/28/2017   Procedure: ESOPHAGEAL DILATION;  Surgeon: Lucilla Lame, MD;  Location: [place];  Service: Endoscopy;  Laterality: N/A;  . ESOPHAGOGASTRODUODENOSCOPY N/A 04/21/2017   Procedure: ESOPHAGOGASTRODUODENOSCOPY (EGD);  Surgeon: Lucilla Lame, MD;  Location: [place];  Service: Endoscopy;  Laterality: N/A;  . ESOPHAGOGASTRODUODENOSCOPY (EGD) WITH PROPOFOL N/A 07/07/2017   Procedure: ESOPHAGOGASTRODUODENOSCOPY (EGD) WITH PROPOFOL;  Surgeon: Lucilla Lame, MD;  Location: [place];  Service: Endoscopy;  Laterality: N/A;  . ESOPHAGOGASTRODUODENOSCOPY (EGD) WITH PROPOFOL N/A 07/28/2017   Procedure: ESOPHAGOGASTRODUODENOSCOPY (EGD) WITH PROPOFOL;  Surgeon: Lucilla Lame, MD;  Location: [place];  Service: Endoscopy;  Laterality: N/A;  . SALPINGOOPHORECTOMY Bilateral     MEDICATIONS:  (Not in a hospital admission)   ALLERGIES:   Allergies  Allergen Reactions  . Lodine [Etodolac] Diarrhea and Nausea And Vomiting    REVIEW OF SYSTEMS:   Negative except HPI  FAMILY HISTORY:   Family History  Problem Relation Age of Onset  . Pancreatic cancer Mother   . Breast cancer Sister 62  . Prostate cancer Father   . Breast cancer Maternal Aunt   . Breast cancer Cousin        pat cousin  . Breast cancer Sister 89  . Colon cancer Paternal Uncle   . Prostate cancer Brother   . Ovarian cancer Neg  Hx   . Diabetes Neg Hx     SOCIAL HISTORY:   reports that she has never smoked. She has never used smokeless tobacco. She reports that she does not drink alcohol and does not use drugs.  PHYSICAL EXAM:  General appearance: alert, cooperative and no distress Neck: no JVD and supple, symmetrical, trachea midline Resp: clear to auscultation bilaterally Cardio: regular rate and rhythm, S1, S2 normal, no murmur, click, rub or gallop GI:  soft, non-tender; bowel sounds normal; no masses,  no organomegaly Extremities: extremities normal, atraumatic, no cyanosis or edema and Homans sign is negative, no sign of DVT Pulses: 2+ and symmetric Skin: Skin color, texture, turgor normal. No rashes or lesions Neurologic: Alert and oriented X 3, normal strength and tone. Normal symmetric reflexes. Normal coordination and gait    LABORATORY STUDIES: No results for input(s): WBC, HGB, HCT, PLT in the last 72 hours.  No results for input(s): NA, K, CL, CO2, GLUCOSE, BUN, CREATININE, CALCIUM in the last 72 hours.  STUDIES/RESULTS:  No results found.  ASSESSMENT:  End stage osteoarthritis left hip        Active Problems:   * No active hospital problems. *    PLAN:  Left Primary Total Hip   Carlynn Spry 08/02/2020. 10:59 AM

## 2020-08-07 DIAGNOSIS — L578 Other skin changes due to chronic exposure to nonionizing radiation: Secondary | ICD-10-CM | POA: Diagnosis not present

## 2020-08-07 DIAGNOSIS — Z86018 Personal history of other benign neoplasm: Secondary | ICD-10-CM | POA: Diagnosis not present

## 2020-08-07 DIAGNOSIS — L853 Xerosis cutis: Secondary | ICD-10-CM | POA: Diagnosis not present

## 2020-08-08 DIAGNOSIS — R2689 Other abnormalities of gait and mobility: Secondary | ICD-10-CM | POA: Diagnosis not present

## 2020-08-08 DIAGNOSIS — M25552 Pain in left hip: Secondary | ICD-10-CM | POA: Diagnosis not present

## 2020-08-09 ENCOUNTER — Encounter
Admission: RE | Admit: 2020-08-09 | Discharge: 2020-08-09 | Disposition: A | Discharge status: Home / Self Care | Payer: Medicare Other | Source: Ambulatory Visit | Attending: Orthopedic Surgery | Admitting: Orthopedic Surgery

## 2020-08-09 ENCOUNTER — Encounter: Payer: Self-pay | Admitting: Urgent Care

## 2020-08-09 ENCOUNTER — Other Ambulatory Visit: Payer: Self-pay

## 2020-08-09 DIAGNOSIS — Z01818 Encounter for other preprocedural examination: Secondary | ICD-10-CM | POA: Diagnosis not present

## 2020-08-09 DIAGNOSIS — Z136 Encounter for screening for cardiovascular disorders: Secondary | ICD-10-CM | POA: Diagnosis not present

## 2020-08-09 LAB — URINALYSIS, ROUTINE W REFLEX MICROSCOPIC
Bilirubin Urine: NEGATIVE
Glucose, UA: NEGATIVE mg/dL
Ketones, ur: NEGATIVE mg/dL
Nitrite: NEGATIVE
Protein, ur: NEGATIVE mg/dL
Specific Gravity, Urine: 1.001 — ABNORMAL LOW (ref 1.005–1.030)
Squamous Epithelial / HPF: NONE SEEN (ref 0–5)
pH: 5 (ref 5.0–8.0)

## 2020-08-09 LAB — CBC
HCT: 39.7 % (ref 36.0–46.0)
Hemoglobin: 13.2 g/dL (ref 12.0–15.0)
MCH: 29.7 pg (ref 26.0–34.0)
MCHC: 33.2 g/dL (ref 30.0–36.0)
MCV: 89.2 fL (ref 80.0–100.0)
Platelets: 326 10*3/uL (ref 150–400)
RBC: 4.45 MIL/uL (ref 3.87–5.11)
RDW: 12.8 % (ref 11.5–15.5)
WBC: 7 10*3/uL (ref 4.0–10.5)
nRBC: 0 % (ref 0.0–0.2)

## 2020-08-09 LAB — PROTIME-INR
INR: 1 (ref 0.8–1.2)
Prothrombin Time: 12.8 seconds (ref 11.4–15.2)

## 2020-08-09 LAB — TYPE AND SCREEN
ABO/RH(D): A POS
Antibody Screen: NEGATIVE

## 2020-08-09 LAB — BASIC METABOLIC PANEL
Anion gap: 11 (ref 5–15)
BUN: 10 mg/dL (ref 8–23)
CO2: 27 mmol/L (ref 22–32)
Calcium: 9.8 mg/dL (ref 8.9–10.3)
Chloride: 102 mmol/L (ref 98–111)
Creatinine, Ser: 0.73 mg/dL (ref 0.44–1.00)
GFR, Estimated: >60 mL/min (ref >60)
Glucose, Bld: 94 mg/dL (ref 70–99)
Potassium: 3.4 mmol/L — ABNORMAL LOW (ref 3.5–5.1)
Sodium: 140 mmol/L (ref 135–145)

## 2020-08-09 LAB — SURGICAL PCR SCREEN
MRSA, PCR: NEGATIVE
Staphylococcus aureus: NEGATIVE

## 2020-08-09 LAB — APTT: aPTT: 28 seconds (ref 24–36)

## 2020-08-09 NOTE — Patient Instructions (Signed)
Your procedure is scheduled on: Monday August 14, 2020. Report to Day Surgery inside [place] 2nd floor (stop by Registration Desk first). To find out your arrival time please call [phone] between 1PM - 3PM on Friday August 11, 2020.  Remember: Instructions that are not followed completely may result in serious medical risk,  up to and including death, or upon the discretion of your surgeon and anesthesiologist your  surgery may need to be rescheduled.     _X__ 1. Do not eat food after midnight the night before your procedure.                 No chewing gum or hard candies. You may drink clear liquids up to 2 hours                 before you are scheduled to arrive for your surgery- DO not drink clear                 liquids within 2 hours of the start of your surgery.                 Clear Liquids include:  water, apple juice without pulp, clear Gatorade, G2 or                  Gatorade Zero (avoid Red/Purple/Blue), Black Coffee or Tea (Do not add                 anything to coffee or tea).  __X__2.  On the morning of surgery brush your teeth with toothpaste and water, you                may rinse your mouth with mouthwash if you wish.  Do not swallow any toothpaste of mouthwash.     _X__ 3.  No Alcohol for 24 hours before or after surgery.   _X__ 4.  Do Not Smoke or use e-cigarettes For 24 Hours Prior to Your Surgery.                 Do not use any chewable tobacco products for at least 6 hours prior to                 Surgery.  _X__  5.  Do not use any recreational drugs (marijuana, cocaine, heroin, ecstasy, MDMA or other)                For at least one week prior to your surgery.  Combination of these drugs with anesthesia                May have life threatening results.   __X__ 6.  Notify your doctor if there is any change in your medical condition      (cold, fever, infections).     Do not wear jewelry, make-up, hairpins, clips or nail  polish. Do not wear lotions, powders, or perfumes. You may wear deodorant. Do not shave 48 hours prior to surgery. Men may shave face and neck. Do not bring valuables to the hospital.    [facility] is not responsible for any belongings or valuables.  Contacts, dentures or bridgework may not be worn into surgery. Leave your suitcase in the car. After surgery it may be brought to your room. For patients admitted to the hospital, discharge time is determined by your treatment team.   Patients discharged the day of surgery will not be allowed to drive home.  Make arrangements for someone to be with you for the first 24 hours of your Same Day Discharge.   __X__ Take these medicines the morning of surgery with A SIP OF WATER:    1. pantoprazole (PROTONIX) 40 MG    ____ Fleet Enema (as directed)   __X__ Use CHG Soap (or wipes) as directed  ____ Use Benzoyl Peroxide Gel as instructed  ____ Use inhalers on the day of surgery  ____ Stop metformin 2 days prior to surgery    ____ Take 1/2 of usual insulin dose the night before surgery. No insulin the morning          of surgery.   __X__ Stop Anti-inflammatories such as meloxicam (MOBIC), Ibuprofen, Aleve, Advil, naproxen, aspirin and or BC powders.    __X__ Stop supplements until after surgery.    __X__ Do not start any herbal supplements before your procedure.    If you have any questions regarding your pre-procedure instructions,  Please call Pre-admit Testing at [phone].

## 2020-08-09 NOTE — Progress Notes (Signed)
  [facility] Perioperative Services: Pre-Admission/Anesthesia Testing  Abnormal Lab Notification   Date: 08/09/20  Name: Valerie Raymond MRN:   [id number]  Re: Abnormal labs noted during PAT appointment   Provider(s) Notified: Kurtis Bushman, MD and Carlynn Spry, PA-C Notification mode: Routed and/or faxed via [place] LAB VALUE(S): Lab Results  Component Value Date   COLORURINE STRAW (A) 08/09/2020   APPEARANCEUR CLEAR (A) 08/09/2020   LABSPEC 1.001 (L) 08/09/2020   PHURINE 5.0 08/09/2020   GLUCOSEU NEGATIVE 08/09/2020   HGBUR SMALL (A) 08/09/2020   BILIRUBINUR NEGATIVE 08/09/2020   KETONESUR NEGATIVE 08/09/2020   PROTEINUR NEGATIVE 08/09/2020   UROBILINOGEN 0.2 04/06/2020   NITRITE NEGATIVE 08/09/2020   LEUKOCYTESUR MODERATE (A) 08/09/2020   EPIU NONE SEEN 08/09/2020   WBCU 11-20 08/09/2020   RBCU 0-5 08/09/2020   BACTERIA RARE (A) 08/09/2020    Notes:  Patient scheduled for LEFT total hip arthroplasty on 08/14/2020  UA performed in PATconcerning for potential infection (moderate LE, 11-20 WBC/hpf, and rare bacteria).   . No leukocytosis noted on CBC  . Renal function normal. Estimated Creatinine Clearance: 62.1 mL/min (by C-G formula based on SCr of 0.73 mg/dL).   . Urine C&S added to assess for pathogenically significant growth.  Will forward UA, and subsequent C&S results, to attending surgeon and his APP for review and Tx as deemed appropriate. This is a Community education officer; no formal response is required.  Honor Loh, MSN, APRN, FNP-C, CEN [facility]  Peri-operative Services Nurse Practitioner Phone: [phone] Fax: [phone] 08/09/20 12:15 PM

## 2020-08-10 ENCOUNTER — Other Ambulatory Visit: Payer: Medicare Other

## 2020-08-11 ENCOUNTER — Other Ambulatory Visit
Admission: RE | Admit: 2020-08-11 | Discharge: 2020-08-11 | Disposition: A | Discharge status: Home / Self Care | Payer: Medicare Other | Source: Ambulatory Visit | Attending: Orthopedic Surgery | Admitting: Orthopedic Surgery

## 2020-08-11 ENCOUNTER — Other Ambulatory Visit: Payer: Self-pay

## 2020-08-11 DIAGNOSIS — Z01812 Encounter for preprocedural laboratory examination: Secondary | ICD-10-CM | POA: Insufficient documentation

## 2020-08-11 DIAGNOSIS — Z20822 Contact with and (suspected) exposure to covid-19: Secondary | ICD-10-CM | POA: Diagnosis not present

## 2020-08-11 DIAGNOSIS — R8290 Unspecified abnormal findings in urine: Secondary | ICD-10-CM | POA: Insufficient documentation

## 2020-08-11 LAB — URINE CULTURE: Culture: 80000 — AB

## 2020-08-12 LAB — SARS CORONAVIRUS 2 (TAT 6-24 HRS): SARS Coronavirus 2: NEGATIVE

## 2020-08-14 ENCOUNTER — Ambulatory Visit: Admission: RE | Admit: 2020-08-14 | Payer: Medicare Other | Source: Home / Self Care | Admitting: Orthopedic Surgery

## 2020-08-14 ENCOUNTER — Encounter: Admission: RE | Payer: Self-pay | Source: Home / Self Care

## 2020-08-14 DIAGNOSIS — M1612 Unilateral primary osteoarthritis, left hip: Secondary | ICD-10-CM | POA: Diagnosis not present

## 2020-08-14 SURGERY — ARTHROPLASTY, HIP, TOTAL, ANTERIOR APPROACH
Anesthesia: Spinal | Site: Hip | Laterality: Left

## 2020-08-16 ENCOUNTER — Ambulatory Visit: Payer: Self-pay | Admitting: Internal Medicine

## 2020-08-17 ENCOUNTER — Telehealth: Payer: Self-pay

## 2020-08-17 NOTE — Telephone Encounter (Signed)
Patient called in wanting to inform her provider that the [facility] was not working for her and that she would like to be prescribed Premarin again.   Could you please advise?

## 2020-08-18 MED ORDER — ESTROGENS, CONJUGATED 0.625 MG/GM VA CREA: TOPICAL_CREAM | VAGINAL | 12 refills | Status: DC

## 2020-08-18 NOTE — Addendum Note (Signed)
Addended by: Edwyna Shell on: 08/18/2020 04:52 PM   Modules accepted: Orders

## 2020-08-18 NOTE — Telephone Encounter (Signed)
Pt called no answer LM via VM. Pt was advised to call the office to speak about the reason why she would like to switch back to Premarin.

## 2020-08-18 NOTE — Telephone Encounter (Signed)
Ok. Thank you.

## 2020-08-18 NOTE — Telephone Encounter (Signed)
Pt called back to the office and stated that she was not getting the same results from the [facility] that she had gotten from the Premarin cream. Premarin cream was refilled and sent to her pharmacy.

## 2020-08-18 NOTE — Telephone Encounter (Signed)
D/C pt's trimo cream and refilled premarin cream for pt.

## 2020-08-21 ENCOUNTER — Other Ambulatory Visit: Payer: Self-pay

## 2020-08-21 ENCOUNTER — Other Ambulatory Visit
Admission: RE | Admit: 2020-08-21 | Discharge: 2020-08-21 | Disposition: A | Discharge status: Home / Self Care | Payer: Medicare Other | Attending: Orthopedic Surgery | Admitting: Orthopedic Surgery

## 2020-08-21 DIAGNOSIS — N390 Urinary tract infection, site not specified: Secondary | ICD-10-CM | POA: Diagnosis not present

## 2020-08-21 LAB — URINALYSIS, COMPLETE (UACMP) WITH MICROSCOPIC
Bilirubin Urine: NEGATIVE
Glucose, UA: NEGATIVE mg/dL
Ketones, ur: NEGATIVE mg/dL
Nitrite: NEGATIVE
Protein, ur: NEGATIVE mg/dL
RBC / HPF: NONE SEEN RBC/hpf (ref 0–5)
Specific Gravity, Urine: <1.005 — ABNORMAL LOW (ref 1.005–1.030)
pH: 5 (ref 5.0–8.0)

## 2020-08-30 ENCOUNTER — Other Ambulatory Visit: Payer: Self-pay

## 2020-08-30 ENCOUNTER — Other Ambulatory Visit
Admission: RE | Admit: 2020-08-30 | Discharge: 2020-08-30 | Disposition: A | Discharge status: Home / Self Care | Payer: Medicare Other | Attending: Orthopedic Surgery | Admitting: Orthopedic Surgery

## 2020-08-30 DIAGNOSIS — N390 Urinary tract infection, site not specified: Secondary | ICD-10-CM | POA: Diagnosis not present

## 2020-08-30 LAB — URINALYSIS, COMPLETE (UACMP) WITH MICROSCOPIC
Bilirubin Urine: NEGATIVE
Glucose, UA: NEGATIVE mg/dL
Hgb urine dipstick: NEGATIVE
Ketones, ur: NEGATIVE mg/dL
Leukocytes,Ua: NEGATIVE
Nitrite: NEGATIVE
Protein, ur: NEGATIVE mg/dL
RBC / HPF: NONE SEEN RBC/hpf (ref 0–5)
Specific Gravity, Urine: <1.005 — ABNORMAL LOW (ref 1.005–1.030)
pH: 5 (ref 5.0–8.0)

## 2020-09-11 ENCOUNTER — Ambulatory Visit (INDEPENDENT_AMBULATORY_CARE_PROVIDER_SITE_OTHER): Payer: Medicare Other | Admitting: Internal Medicine

## 2020-09-11 ENCOUNTER — Encounter: Payer: Self-pay | Admitting: Internal Medicine

## 2020-09-11 ENCOUNTER — Other Ambulatory Visit: Payer: Self-pay

## 2020-09-11 VITALS — BP 140/80 | HR 75 | Temp 97.5°F | Ht 66.0 in | Wt 138.0 lb

## 2020-09-11 DIAGNOSIS — E559 Vitamin D deficiency, unspecified: Secondary | ICD-10-CM

## 2020-09-11 DIAGNOSIS — E785 Hyperlipidemia, unspecified: Secondary | ICD-10-CM

## 2020-09-11 DIAGNOSIS — M1612 Unilateral primary osteoarthritis, left hip: Secondary | ICD-10-CM | POA: Diagnosis not present

## 2020-09-11 NOTE — Progress Notes (Signed)
Date:  09/11/2020   Name:  Valerie Raymond   DOB:  02-09-1951   MRN:  [id number]   Chief Complaint: Hyperlipidemia  Hyperlipidemia This is a chronic problem. Pertinent negatives include no chest pain, focal sensory loss, myalgias or shortness of breath. Current antihyperlipidemic treatment includes statins (started 6 months ago). There are no compliance problems.   Low vitamin D - now on 2000 IU vit D daily.  Hip surgery in about 3 weeks is planned.  It was postponed from November because of a UTI.   Lab Results  Component Value Date   CREATININE 0.73 08/09/2020   BUN 10 08/09/2020   NA 140 08/09/2020   K 3.4 (L) 08/09/2020   CL 102 08/09/2020   CO2 27 08/09/2020   Lab Results  Component Value Date   CHOL 213 (H) 04/06/2020   HDL 76 04/06/2020   LDLCALC 123 (H) 04/06/2020   TRIG 81 04/06/2020   CHOLHDL 2.8 04/06/2020   Lab Results  Component Value Date   TSH 1.220 04/06/2020   No results found for: HGBA1C Lab Results  Component Value Date   WBC 7.0 08/09/2020   HGB 13.2 08/09/2020   HCT 39.7 08/09/2020   MCV 89.2 08/09/2020   PLT 326 08/09/2020   Lab Results  Component Value Date   ALT 15 04/06/2020   AST 22 04/06/2020   ALKPHOS 79 04/06/2020   BILITOT 0.3 04/06/2020   Last vitamin D Lab Results  Component Value Date   25OHVITD2 <1.0 08/04/2019   25OHVITD3 39 08/04/2019   VD25OH 20.8 (L) 04/06/2020      Review of Systems  Constitutional: Negative for chills, fatigue and fever.  Respiratory: Negative for cough, chest tightness and shortness of breath.   Cardiovascular: Negative for chest pain and leg swelling.  Gastrointestinal: Negative for abdominal pain.  Genitourinary: Negative for difficulty urinating, dysuria, frequency, hematuria and urgency.  Musculoskeletal: Positive for arthralgias and gait problem. Negative for myalgias.  Neurological: Negative for dizziness and headaches.  Psychiatric/Behavioral: Negative for sleep disturbance.     Patient Active Problem List   Diagnosis Date Noted   Labral tear of hip, degenerative (Bilateral) 06/19/2020   Abnormal MRI, hip joint (Bilateral) (05/31/2020) 06/19/2020   Chronic sacroiliac joint pain (Right) 05/22/2020   Other spondylosis, sacral and sacrococcygeal region 04/25/2020   Chronic sacroiliac joint pain (Bilateral) (L>R) 04/11/2020   Enthesopathy of sacroiliac joint (Bilateral) 04/11/2020   Somatic dysfunction of sacroiliac joints (Bilateral) 04/11/2020   Osteoarthritis of hip (Left) 03/22/2020   Spondylosis without myelopathy or radiculopathy, lumbosacral region 09/28/2019   Trochanteric bursitis of hips (Bilateral) (L>R) 08/17/2019   Chronic thigh pain (1ry area of Pain) (Left) 08/04/2019   Lower extremity burning sensation (thigh) (Left) 08/04/2019   Greater trochanteric bursitis of hip (Left) 08/04/2019   Lumbar facet syndrome (Bilateral) (L>R) 08/04/2019   Chronic pain syndrome 08/03/2019   Problems influencing health status 08/03/2019   Abnormal MRI, lumbar spine (06/29/2019) 08/03/2019   Grade 1 Lumbar Retrolisthesis L3/L4 08/03/2019   DDD (degenerative disc disease), lumbosacral 08/03/2019   Lumbosacral intervertebral disc displacement (IVDD) 08/03/2019   Lumbar facet arthropathy (Multilevel) (Bilateral) 08/03/2019   Lumbosacral foraminal stenosis (Bilateral) (L5-S1) 08/03/2019   Lumbar lateral recess stenosis (Left) (L2-3) 08/03/2019   Chronic lower extremity pain (3ry area of Pain) (Bilateral) (L>R) 08/03/2019   Chronic low back pain (2ry area of Pain) (Bilateral) (L>R) w/o sciatica 08/03/2019   Chronic foot pain (Right) 08/03/2019   Chronic lumbar radiculitis 08/03/2019  Chronic musculoskeletal pain 08/03/2019   Hyperlipidemia, mild 05/22/2018   Lumbar radiculopathy 07/16/2017   Stricture and stenosis of esophagus    Lumbar pain with radiation down legs (Bilateral) 06/05/2017   Dysphagia 03/21/2017   Tonsillith  10/09/2016   Family history of breast cancer in first degree relative 01/02/2016   Status post total abdominal hysterectomy and bilateral salpingo-oophorectomy (TAH-BSO) 01/02/2016   Vaginal atrophy 01/02/2016   Incomplete bladder emptying 01/02/2016   Midline cystocele 01/02/2016   Colon, diverticulosis 07/23/2015   Chronic hip pain (Bilateral) (L>R) 07/23/2015   Arthritis of knee, degenerative 07/23/2015   Other osteoporosis without current pathological fracture 07/23/2015   Avitaminosis D 07/23/2015    Allergies  Allergen Reactions   Lodine [Etodolac] Diarrhea and Nausea And Vomiting    Past Surgical History:  Procedure Laterality Date   ABDOMINAL HYSTERECTOMY  1992   COLONOSCOPY  2006   COLONOSCOPY N/A 01/23/2015   Procedure: COLONOSCOPY;  Surgeon: Lucilla Lame, MD;  Location: [facility];  Service: Gastroenterology;  Laterality: N/A;  [id number]   ESOPHAGEAL DILATION  04/21/2017   Procedure: ESOPHAGEAL DILATION;  Surgeon: Lucilla Lame, MD;  Location: [place];  Service: Endoscopy;;   ESOPHAGEAL DILATION  07/07/2017   Procedure: ESOPHAGEAL DILATION;  Surgeon: Lucilla Lame, MD;  Location: [place];  Service: Endoscopy;;   ESOPHAGEAL DILATION N/A 07/28/2017   Procedure: ESOPHAGEAL DILATION;  Surgeon: Lucilla Lame, MD;  Location: [place];  Service: Endoscopy;  Laterality: N/A;   ESOPHAGOGASTRODUODENOSCOPY N/A 04/21/2017   Procedure: ESOPHAGOGASTRODUODENOSCOPY (EGD);  Surgeon: Lucilla Lame, MD;  Location: [place];  Service: Endoscopy;  Laterality: N/A;   ESOPHAGOGASTRODUODENOSCOPY (EGD) WITH PROPOFOL N/A 07/07/2017   Procedure: ESOPHAGOGASTRODUODENOSCOPY (EGD) WITH PROPOFOL;  Surgeon: Lucilla Lame, MD;  Location: [place];  Service: Endoscopy;  Laterality: N/A;   ESOPHAGOGASTRODUODENOSCOPY (EGD) WITH PROPOFOL N/A 07/28/2017   Procedure: ESOPHAGOGASTRODUODENOSCOPY (EGD) WITH PROPOFOL;  Surgeon: Lucilla Lame, MD;  Location: [place];  Service: Endoscopy;  Laterality: N/A;   SALPINGOOPHORECTOMY Bilateral     Social History   Tobacco Use   Smoking status: Never Smoker   Smokeless tobacco: Never Used   Tobacco comment: smoking cessation materials not required  Vaping Use   Vaping Use: Never used  Substance Use Topics   Alcohol use: No    Alcohol/week: 0.0 standard drinks   Drug use: No     Medication list has been reviewed and updated.  Current Meds  Medication Sig   alendronate (FOSAMAX) 70 MG tablet TAKE 1 TABLET BY MOUTH ONCE A WEEK WITH A GLASS OF WATER ON AN EMPTY STOMACH AND REMAIN UPRIGHT WITHOUT EATING FOR 30 MINUTES (Patient taking differently: Take 70 mg by mouth every Monday.)   ARTIFICIAL TEARS 1 % ophthalmic solution Place 1 drop into both eyes daily as needed (Dry eye).    atorvastatin (LIPITOR) 10 MG tablet Take 1 tablet (10 mg total) by mouth daily.   Calcium Carbonate-Vit D-Min (CALCIUM 1200 PO) Take 1,200 mg by mouth daily.    Cholecalciferol (VITAMIN D3) 50 MCG (2000 UT) capsule Take 2,000 Units by mouth daily. am   conjugated estrogens (PREMARIN) vaginal cream Place vaginally 2 (two) times a week. Use 1-2 times per week.   meloxicam (MOBIC) 15 MG tablet TAKE 1 TABLET(15 MG) BY MOUTH DAILY (Patient taking differently: Take 15 mg by mouth daily as needed for pain.)   pantoprazole (PROTONIX) 40 MG tablet TAKE 1 TABLET(40 MG) BY MOUTH DAILY IN THE MORNING (Patient  taking differently: Take 40 mg by mouth daily.)    PHQ 2/9 Scores 09/11/2020 06/19/2020 05/11/2020 04/25/2020  PHQ - 2 Score 0 0 0 0  PHQ- 9 Score 0 - - -  Exception Documentation - - - -    GAD 7 : Generalized Anxiety Score 09/11/2020 04/06/2020  Nervous, Anxious, on Edge 0 0  Control/stop worrying 0 0  Worry too much - different things 0 0  Trouble relaxing 0 0  Restless 0 0  Easily annoyed or irritable 0 0  Afraid - awful might happen 0 0  Total GAD 7 Score 0 0   Anxiety Difficulty - Not difficult at all    BP Readings from Last 3 Encounters:  09/11/20 140/80  08/09/20 (!) 147/87  06/19/20 (!) 148/96    Physical Exam Vitals and nursing note reviewed.  Constitutional:      General: She is not in acute distress.    Appearance: She is well-developed.  HENT:     Head: Normocephalic and atraumatic.  Cardiovascular:     Rate and Rhythm: Normal rate and regular rhythm.     Pulses: Normal pulses.     Heart sounds: No murmur heard.   Pulmonary:     Effort: Pulmonary effort is normal. No respiratory distress.     Breath sounds: No wheezing or rhonchi.  Abdominal:     General: Abdomen is flat.     Palpations: Abdomen is soft.     Tenderness: There is no abdominal tenderness.  Musculoskeletal:     Right lower leg: No edema.     Left lower leg: No edema.  Lymphadenopathy:     Cervical: No cervical adenopathy.  Skin:    General: Skin is warm and dry.     Findings: No rash.  Neurological:     General: No focal deficit present.     Mental Status: She is alert and oriented to person, place, and time.  Psychiatric:        Mood and Affect: Mood and affect and mood normal.     Wt Readings from Last 3 Encounters:  09/11/20 138 lb (62.6 kg)  08/09/20 141 lb 1.6 oz (64 kg)  06/19/20 131 lb (59.4 kg)    BP 140/80    Pulse 75    Temp (!) 97.5 F (36.4 C) (Oral)    Ht 5\' 6"  (1.676 m)    Wt 138 lb (62.6 kg)    SpO2 98%    BMI 22.27 kg/m   Assessment and Plan: 1. Hyperlipidemia, mild Now on statin therapy for elevated 10 yr risk with family hx Tolerating meds well -  - Lipid panel - Comprehensive metabolic panel  2. Avitaminosis D Now on 2000 IU daily after levels were low last visit - VITAMIN D 25 Hydroxy (Vit-D Deficiency, Fractures)  3. Osteoarthritis of hip (Left) Surgery planned for 10/04/20 Recommend rechecking a UA one week prior   Partially dictated using Dragon software. Any errors are unintentional.  10/06/20,  MD [facility]  09/11/2020

## 2020-09-12 LAB — LIPID PANEL
Chol/HDL Ratio: 2.4 ratio (ref 0.0–4.4)
Cholesterol, Total: 148 mg/dL (ref 100–199)
HDL: 61 mg/dL (ref >39)
LDL Chol Calc (NIH): 71 mg/dL (ref 0–99)
Triglycerides: 86 mg/dL (ref 0–149)
VLDL Cholesterol Cal: 16 mg/dL (ref 5–40)

## 2020-09-12 LAB — COMPREHENSIVE METABOLIC PANEL
ALT: 16 IU/L (ref 0–32)
AST: 25 IU/L (ref 0–40)
Albumin/Globulin Ratio: 1.6 (ref 1.2–2.2)
Albumin: 4.4 g/dL (ref 3.8–4.8)
Alkaline Phosphatase: 77 IU/L (ref 44–121)
BUN/Creatinine Ratio: 11 — ABNORMAL LOW (ref 12–28)
BUN: 8 mg/dL (ref 8–27)
Bilirubin Total: 0.4 mg/dL (ref 0.0–1.2)
CO2: 22 mmol/L (ref 20–29)
Calcium: 9.8 mg/dL (ref 8.7–10.3)
Chloride: 101 mmol/L (ref 96–106)
Creatinine, Ser: 0.73 mg/dL (ref 0.57–1.00)
GFR calc Af Amer: 97 mL/min/{1.73_m2} (ref >59)
GFR calc non Af Amer: 84 mL/min/{1.73_m2} (ref >59)
Globulin, Total: 2.7 g/dL (ref 1.5–4.5)
Glucose: 93 mg/dL (ref 65–99)
Potassium: 4 mmol/L (ref 3.5–5.2)
Sodium: 139 mmol/L (ref 134–144)
Total Protein: 7.1 g/dL (ref 6.0–8.5)

## 2020-09-12 LAB — VITAMIN D 25 HYDROXY (VIT D DEFICIENCY, FRACTURES): Vit D, 25-Hydroxy: 50 ng/mL (ref 30.0–100.0)

## 2020-09-16 HISTORY — PX: TOTAL HIP ARTHROPLASTY: SHX124

## 2020-09-25 ENCOUNTER — Ambulatory Visit (INDEPENDENT_AMBULATORY_CARE_PROVIDER_SITE_OTHER): Payer: Medicare Other

## 2020-09-25 ENCOUNTER — Other Ambulatory Visit: Payer: Self-pay

## 2020-09-25 DIAGNOSIS — T83511A Infection and inflammatory reaction due to indwelling urethral catheter, initial encounter: Secondary | ICD-10-CM

## 2020-09-25 DIAGNOSIS — N390 Urinary tract infection, site not specified: Secondary | ICD-10-CM | POA: Diagnosis not present

## 2020-09-25 LAB — POCT URINALYSIS DIPSTICK
Bilirubin, UA: NEGATIVE
Blood, UA: NEGATIVE
Glucose, UA: NEGATIVE
Ketones, UA: NEGATIVE
Leukocytes, UA: NEGATIVE
Nitrite, UA: NEGATIVE
Protein, UA: NEGATIVE
Spec Grav, UA: 1.01 (ref 1.010–1.025)
Urobilinogen, UA: 0.2 E.U./dL
pH, UA: 6 (ref 5.0–8.0)

## 2020-09-27 ENCOUNTER — Ambulatory Visit: Payer: Self-pay | Admitting: Internal Medicine

## 2020-09-27 ENCOUNTER — Other Ambulatory Visit: Admission: RE | Admit: 2020-09-27 | Payer: Medicare Other | Source: Ambulatory Visit

## 2020-09-27 DIAGNOSIS — M1612 Unilateral primary osteoarthritis, left hip: Secondary | ICD-10-CM | POA: Diagnosis not present

## 2020-09-27 DIAGNOSIS — Z01812 Encounter for preprocedural laboratory examination: Secondary | ICD-10-CM | POA: Diagnosis not present

## 2020-09-28 DIAGNOSIS — Z791 Long term (current) use of non-steroidal anti-inflammatories (NSAID): Secondary | ICD-10-CM | POA: Diagnosis not present

## 2020-09-28 DIAGNOSIS — E7800 Pure hypercholesterolemia, unspecified: Secondary | ICD-10-CM | POA: Diagnosis not present

## 2020-09-28 DIAGNOSIS — Z886 Allergy status to analgesic agent status: Secondary | ICD-10-CM | POA: Diagnosis not present

## 2020-09-28 DIAGNOSIS — K219 Gastro-esophageal reflux disease without esophagitis: Secondary | ICD-10-CM | POA: Diagnosis not present

## 2020-09-28 DIAGNOSIS — M1612 Unilateral primary osteoarthritis, left hip: Secondary | ICD-10-CM | POA: Diagnosis not present

## 2020-09-28 DIAGNOSIS — E785 Hyperlipidemia, unspecified: Secondary | ICD-10-CM | POA: Diagnosis not present

## 2020-09-28 DIAGNOSIS — M810 Age-related osteoporosis without current pathological fracture: Secondary | ICD-10-CM | POA: Diagnosis not present

## 2020-09-28 DIAGNOSIS — Z79890 Hormone replacement therapy (postmenopausal): Secondary | ICD-10-CM | POA: Diagnosis not present

## 2020-09-29 DIAGNOSIS — Z886 Allergy status to analgesic agent status: Secondary | ICD-10-CM | POA: Diagnosis not present

## 2020-09-29 DIAGNOSIS — K219 Gastro-esophageal reflux disease without esophagitis: Secondary | ICD-10-CM | POA: Diagnosis not present

## 2020-09-29 DIAGNOSIS — M810 Age-related osteoporosis without current pathological fracture: Secondary | ICD-10-CM | POA: Diagnosis not present

## 2020-09-29 DIAGNOSIS — Z79890 Hormone replacement therapy (postmenopausal): Secondary | ICD-10-CM | POA: Diagnosis not present

## 2020-09-29 DIAGNOSIS — E7800 Pure hypercholesterolemia, unspecified: Secondary | ICD-10-CM | POA: Diagnosis not present

## 2020-09-29 DIAGNOSIS — M1612 Unilateral primary osteoarthritis, left hip: Secondary | ICD-10-CM | POA: Diagnosis not present

## 2020-09-29 DIAGNOSIS — Z791 Long term (current) use of non-steroidal anti-inflammatories (NSAID): Secondary | ICD-10-CM | POA: Diagnosis not present

## 2020-09-29 DIAGNOSIS — E785 Hyperlipidemia, unspecified: Secondary | ICD-10-CM | POA: Diagnosis not present

## 2020-10-02 ENCOUNTER — Other Ambulatory Visit: Payer: Medicare Other

## 2020-10-04 ENCOUNTER — Encounter: Admission: RE | Payer: Self-pay | Source: Home / Self Care

## 2020-10-04 ENCOUNTER — Ambulatory Visit: Admission: RE | Admit: 2020-10-04 | Payer: Medicare Other | Source: Home / Self Care | Admitting: Orthopedic Surgery

## 2020-10-04 DIAGNOSIS — M1612 Unilateral primary osteoarthritis, left hip: Secondary | ICD-10-CM | POA: Diagnosis not present

## 2020-10-04 DIAGNOSIS — M25552 Pain in left hip: Secondary | ICD-10-CM | POA: Diagnosis not present

## 2020-10-04 DIAGNOSIS — M25652 Stiffness of left hip, not elsewhere classified: Secondary | ICD-10-CM | POA: Diagnosis not present

## 2020-10-04 SURGERY — ARTHROPLASTY, HIP, TOTAL, ANTERIOR APPROACH
Anesthesia: Spinal | Site: Hip | Laterality: Left

## 2020-10-11 DIAGNOSIS — M1612 Unilateral primary osteoarthritis, left hip: Secondary | ICD-10-CM | POA: Diagnosis not present

## 2020-10-11 DIAGNOSIS — M25652 Stiffness of left hip, not elsewhere classified: Secondary | ICD-10-CM | POA: Diagnosis not present

## 2020-10-11 DIAGNOSIS — M25552 Pain in left hip: Secondary | ICD-10-CM | POA: Diagnosis not present

## 2020-10-16 ENCOUNTER — Other Ambulatory Visit: Payer: Self-pay | Admitting: Internal Medicine

## 2020-10-18 DIAGNOSIS — M25652 Stiffness of left hip, not elsewhere classified: Secondary | ICD-10-CM | POA: Diagnosis not present

## 2020-10-18 DIAGNOSIS — M1612 Unilateral primary osteoarthritis, left hip: Secondary | ICD-10-CM | POA: Diagnosis not present

## 2020-10-18 DIAGNOSIS — M25552 Pain in left hip: Secondary | ICD-10-CM | POA: Diagnosis not present

## 2020-10-20 DIAGNOSIS — M25652 Stiffness of left hip, not elsewhere classified: Secondary | ICD-10-CM | POA: Diagnosis not present

## 2020-10-20 DIAGNOSIS — M1612 Unilateral primary osteoarthritis, left hip: Secondary | ICD-10-CM | POA: Diagnosis not present

## 2020-10-20 DIAGNOSIS — M25552 Pain in left hip: Secondary | ICD-10-CM | POA: Diagnosis not present

## 2020-10-25 DIAGNOSIS — M1612 Unilateral primary osteoarthritis, left hip: Secondary | ICD-10-CM | POA: Diagnosis not present

## 2020-10-25 DIAGNOSIS — M25652 Stiffness of left hip, not elsewhere classified: Secondary | ICD-10-CM | POA: Diagnosis not present

## 2020-10-25 DIAGNOSIS — M25552 Pain in left hip: Secondary | ICD-10-CM | POA: Diagnosis not present

## 2020-10-27 DIAGNOSIS — M1612 Unilateral primary osteoarthritis, left hip: Secondary | ICD-10-CM | POA: Diagnosis not present

## 2020-10-27 DIAGNOSIS — M25552 Pain in left hip: Secondary | ICD-10-CM | POA: Diagnosis not present

## 2020-10-27 DIAGNOSIS — M25652 Stiffness of left hip, not elsewhere classified: Secondary | ICD-10-CM | POA: Diagnosis not present

## 2020-11-01 DIAGNOSIS — M1612 Unilateral primary osteoarthritis, left hip: Secondary | ICD-10-CM | POA: Diagnosis not present

## 2020-11-01 DIAGNOSIS — M25652 Stiffness of left hip, not elsewhere classified: Secondary | ICD-10-CM | POA: Diagnosis not present

## 2020-11-01 DIAGNOSIS — M25552 Pain in left hip: Secondary | ICD-10-CM | POA: Diagnosis not present

## 2020-11-03 DIAGNOSIS — M25552 Pain in left hip: Secondary | ICD-10-CM | POA: Diagnosis not present

## 2020-11-03 DIAGNOSIS — M25652 Stiffness of left hip, not elsewhere classified: Secondary | ICD-10-CM | POA: Diagnosis not present

## 2020-11-03 DIAGNOSIS — M1612 Unilateral primary osteoarthritis, left hip: Secondary | ICD-10-CM | POA: Diagnosis not present

## 2020-11-06 DIAGNOSIS — Z96642 Presence of left artificial hip joint: Secondary | ICD-10-CM | POA: Diagnosis not present

## 2020-11-06 DIAGNOSIS — M1612 Unilateral primary osteoarthritis, left hip: Secondary | ICD-10-CM | POA: Diagnosis not present

## 2020-11-08 DIAGNOSIS — M25552 Pain in left hip: Secondary | ICD-10-CM | POA: Diagnosis not present

## 2020-11-08 DIAGNOSIS — M25652 Stiffness of left hip, not elsewhere classified: Secondary | ICD-10-CM | POA: Diagnosis not present

## 2020-11-08 DIAGNOSIS — M1612 Unilateral primary osteoarthritis, left hip: Secondary | ICD-10-CM | POA: Diagnosis not present

## 2020-11-10 DIAGNOSIS — H53031 Strabismic amblyopia, right eye: Secondary | ICD-10-CM | POA: Diagnosis not present

## 2020-11-17 ENCOUNTER — Encounter: Payer: Self-pay | Admitting: Obstetrics and Gynecology

## 2020-11-17 DIAGNOSIS — M25552 Pain in left hip: Secondary | ICD-10-CM | POA: Diagnosis not present

## 2020-11-17 DIAGNOSIS — M25652 Stiffness of left hip, not elsewhere classified: Secondary | ICD-10-CM | POA: Diagnosis not present

## 2020-11-17 DIAGNOSIS — M1612 Unilateral primary osteoarthritis, left hip: Secondary | ICD-10-CM | POA: Diagnosis not present

## 2020-11-24 DIAGNOSIS — M1612 Unilateral primary osteoarthritis, left hip: Secondary | ICD-10-CM | POA: Diagnosis not present

## 2020-11-24 DIAGNOSIS — M25652 Stiffness of left hip, not elsewhere classified: Secondary | ICD-10-CM | POA: Diagnosis not present

## 2020-11-24 DIAGNOSIS — M25552 Pain in left hip: Secondary | ICD-10-CM | POA: Diagnosis not present

## 2021-01-04 DIAGNOSIS — L738 Other specified follicular disorders: Secondary | ICD-10-CM | POA: Diagnosis not present

## 2021-01-23 ENCOUNTER — Other Ambulatory Visit: Payer: Self-pay | Admitting: Internal Medicine

## 2021-01-23 DIAGNOSIS — M5136 Other intervertebral disc degeneration, lumbar region: Secondary | ICD-10-CM

## 2021-01-23 NOTE — Telephone Encounter (Signed)
   Notes to clinic: medication last filled on 03/15/2020 Review for continued use and refill    Requested Prescriptions  Pending Prescriptions Disp Refills   meloxicam (MOBIC) 15 MG tablet [Pharmacy Med Name: MELOXICAM 15MG  TABLETS] 30 tablet 5    Sig: TAKE 1 TABLET(15 MG) BY MOUTH DAILY      Analgesics:  COX2 Inhibitors Passed - 01/23/2021  8:08 AM      Passed - HGB in normal range and within 360 days    Hemoglobin  Date Value Ref Range Status  08/09/2020 13.2 12.0 - 15.0 g/dL Final  04/06/2020 13.6 11.1 - 15.9 g/dL Final          Passed - Cr in normal range and within 360 days    Creatinine, Ser  Date Value Ref Range Status  09/11/2020 0.73 0.57 - 1.00 mg/dL Final          Passed - Patient is not pregnant      Passed - Valid encounter within last 12 months    Recent Outpatient Visits           4 months ago Hyperlipidemia, mild   [facility] [name], MD   7 months ago Elevated BP without diagnosis of hypertension   [facility] [name], MD   9 months ago Annual physical exam   [facility] [name], MD   1 year ago Lumbar radiculopathy   [facility] [name], MD   1 year ago Annual physical exam   [facility] [name], MD       Future Appointments             In 2 months Army Melia Jesse Sans, MD [facility], [facility]

## 2021-02-27 ENCOUNTER — Other Ambulatory Visit: Payer: Self-pay | Admitting: Internal Medicine

## 2021-02-27 DIAGNOSIS — Z1231 Encounter for screening mammogram for malignant neoplasm of breast: Secondary | ICD-10-CM

## 2021-03-17 ENCOUNTER — Other Ambulatory Visit: Payer: Self-pay | Admitting: Internal Medicine

## 2021-03-17 DIAGNOSIS — E785 Hyperlipidemia, unspecified: Secondary | ICD-10-CM

## 2021-04-10 ENCOUNTER — Encounter: Payer: Medicare Other | Admitting: Internal Medicine

## 2021-04-11 ENCOUNTER — Other Ambulatory Visit: Payer: Self-pay

## 2021-04-11 ENCOUNTER — Ambulatory Visit
Admission: RE | Admit: 2021-04-11 | Discharge: 2021-04-11 | Disposition: A | Discharge status: Home / Self Care | Payer: Medicare Other | Source: Ambulatory Visit | Attending: Internal Medicine | Admitting: Internal Medicine

## 2021-04-11 DIAGNOSIS — Z1231 Encounter for screening mammogram for malignant neoplasm of breast: Secondary | ICD-10-CM | POA: Diagnosis not present

## 2021-04-23 ENCOUNTER — Ambulatory Visit (INDEPENDENT_AMBULATORY_CARE_PROVIDER_SITE_OTHER): Payer: Medicare Other

## 2021-04-23 ENCOUNTER — Other Ambulatory Visit: Payer: Self-pay

## 2021-04-23 VITALS — BP 138/72 | HR 67 | Temp 98.1°F | Resp 16 | Ht 66.0 in | Wt 137.6 lb

## 2021-04-23 DIAGNOSIS — Z0000 Encounter for general adult medical examination without abnormal findings: Secondary | ICD-10-CM

## 2021-04-23 NOTE — Patient Instructions (Signed)
[name] , Thank you for taking time to come for your Medicare Wellness Visit. I appreciate your ongoing commitment to your health goals. Please review the following plan we discussed and let me know if I can assist you in the future.   Screening recommendations/referrals: Colonoscopy: done 01/23/15. Repeat 01/2025 Mammogram: done 04/11/21 Bone Density: done 04/13/18 Recommended yearly ophthalmology/optometry visit for glaucoma screening and checkup Recommended yearly dental visit for hygiene and checkup  Vaccinations: Influenza vaccine: done 06/07/20 Pneumococcal vaccine: done 03/23/18 Tdap vaccine: done 03/13/16 Shingles vaccine: done 08/31/18 & 11/18/18   Covid-19:done 11/11/19, 12/07/19, 06/19/20 & 02/13/21  Advanced directives: Please bring a copy of your health care power of attorney and living will to the office at your convenience once you have completed those documents.   Conditions/risks identified: Keep up the great work!  Next appointment: Follow up in one year for your annual wellness visit    Preventive Care 65 Years and Older, Female Preventive care refers to lifestyle choices and visits with your health care provider that can promote health and wellness. What does preventive care include? A yearly physical exam. This is also called an annual well check. Dental exams once or twice a year. Routine eye exams. Ask your health care provider how often you should have your eyes checked. Personal lifestyle choices, including: Daily care of your teeth and gums. Regular physical activity. Eating a healthy diet. Avoiding tobacco and drug use. Limiting alcohol use. Practicing safe sex. Taking low-dose aspirin every day. Taking vitamin and mineral supplements as recommended by your health care provider. What happens during an annual well check? The services and screenings done by your health care provider during your annual well check will depend on your age, overall health, lifestyle  risk factors, and family history of disease. Counseling  Your health care provider may ask you questions about your: Alcohol use. Tobacco use. Drug use. Emotional well-being. Home and relationship well-being. Sexual activity. Eating habits. History of falls. Memory and ability to understand (cognition). Work and work Statistician. Reproductive health. Screening  You may have the following tests or measurements: Height, weight, and BMI. Blood pressure. Lipid and cholesterol levels. These may be checked every 5 years, or more frequently if you are over 19 years old. Skin check. Lung cancer screening. You may have this screening every year starting at age 88 if you have a 30-pack-year history of smoking and currently smoke or have quit within the past 15 years. Fecal occult blood test (FOBT) of the stool. You may have this test every year starting at age 52. Flexible sigmoidoscopy or colonoscopy. You may have a sigmoidoscopy every 5 years or a colonoscopy every 10 years starting at age 67. Hepatitis C blood test. Hepatitis B blood test. Sexually transmitted disease (STD) testing. Diabetes screening. This is done by checking your blood sugar (glucose) after you have not eaten for a while (fasting). You may have this done every 1-3 years. Bone density scan. This is done to screen for osteoporosis. You may have this done starting at age 65. Mammogram. This may be done every 1-2 years. Talk to your health care provider about how often you should have regular mammograms. Talk with your health care provider about your test results, treatment options, and if necessary, the need for more tests. Vaccines  Your health care provider may recommend certain vaccines, such as: Influenza vaccine. This is recommended every year. Tetanus, diphtheria, and acellular pertussis (Tdap, Td) vaccine. You may need a Td booster every  10 years. Zoster vaccine. You may need this after age 33. Pneumococcal  13-valent conjugate (PCV13) vaccine. One dose is recommended after age 83. Pneumococcal polysaccharide (PPSV23) vaccine. One dose is recommended after age 33. Talk to your health care provider about which screenings and vaccines you need and how often you need them. This information is not intended to replace advice given to you by your health care provider. Make sure you discuss any questions you have with your health care provider. Document Released: 09/29/2015 Document Revised: 05/22/2016 Document Reviewed: 07/04/2015 Elsevier Interactive Patient Education  2017 Elim Prevention in the Home Falls can cause injuries. They can happen to people of all ages. There are many things you can do to make your home safe and to help prevent falls. What can I do on the outside of my home? Regularly fix the edges of walkways and driveways and fix any cracks. Remove anything that might make you trip as you walk through a door, such as a raised step or threshold. Trim any bushes or trees on the path to your home. Use bright outdoor lighting. Clear any walking paths of anything that might make someone trip, such as rocks or tools. Regularly check to see if handrails are loose or broken. Make sure that both sides of any steps have handrails. Any raised decks and porches should have guardrails on the edges. Have any leaves, snow, or ice cleared regularly. Use sand or salt on walking paths during winter. Clean up any spills in your garage right away. This includes oil or grease spills. What can I do in the bathroom? Use night lights. Install grab bars by the toilet and in the tub and shower. Do not use towel bars as grab bars. Use non-skid mats or decals in the tub or shower. If you need to sit down in the shower, use a plastic, non-slip stool. Keep the floor dry. Clean up any water that spills on the floor as soon as it happens. Remove soap buildup in the tub or shower regularly. Attach  bath mats securely with double-sided non-slip rug tape. Do not have throw rugs and other things on the floor that can make you trip. What can I do in the bedroom? Use night lights. Make sure that you have a light by your bed that is easy to reach. Do not use any sheets or blankets that are too big for your bed. They should not hang down onto the floor. Have a firm chair that has side arms. You can use this for support while you get dressed. Do not have throw rugs and other things on the floor that can make you trip. What can I do in the kitchen? Clean up any spills right away. Avoid walking on wet floors. Keep items that you use a lot in easy-to-reach places. If you need to reach something above you, use a strong step stool that has a grab bar. Keep electrical cords out of the way. Do not use floor polish or wax that makes floors slippery. If you must use wax, use non-skid floor wax. Do not have throw rugs and other things on the floor that can make you trip. What can I do with my stairs? Do not leave any items on the stairs. Make sure that there are handrails on both sides of the stairs and use them. Fix handrails that are broken or loose. Make sure that handrails are as long as the stairways. Check any carpeting to make  sure that it is firmly attached to the stairs. Fix any carpet that is loose or worn. Avoid having throw rugs at the top or bottom of the stairs. If you do have throw rugs, attach them to the floor with carpet tape. Make sure that you have a light switch at the top of the stairs and the bottom of the stairs. If you do not have them, ask someone to add them for you. What else can I do to help prevent falls? Wear shoes that: Do not have high heels. Have rubber bottoms. Are comfortable and fit you well. Are closed at the toe. Do not wear sandals. If you use a stepladder: Make sure that it is fully opened. Do not climb a closed stepladder. Make sure that both sides of the  stepladder are locked into place. Ask someone to hold it for you, if possible. Clearly mark and make sure that you can see: Any grab bars or handrails. First and last steps. Where the edge of each step is. Use tools that help you move around (mobility aids) if they are needed. These include: Canes. Walkers. Scooters. Crutches. Turn on the lights when you go into a dark area. Replace any light bulbs as soon as they burn out. Set up your furniture so you have a clear path. Avoid moving your furniture around. If any of your floors are uneven, fix them. If there are any pets around you, be aware of where they are. Review your medicines with your doctor. Some medicines can make you feel dizzy. This can increase your chance of falling. Ask your doctor what other things that you can do to help prevent falls. This information is not intended to replace advice given to you by your health care provider. Make sure you discuss any questions you have with your health care provider. Document Released: 06/29/2009 Document Revised: 02/08/2016 Document Reviewed: 10/07/2014 Elsevier Interactive Patient Education  2017 [employer].

## 2021-04-23 NOTE — Progress Notes (Signed)
Subjective:   Nyella Matthai is a 70 y.o. female who presents for Medicare Annual (Subsequent) preventive examination.  Review of Systems     Cardiac Risk Factors include: advanced age (>30mn, >>44women);dyslipidemia     Objective:    Today's Vitals   04/23/21 0817  BP: 138/72  Pulse: 67  Resp: 16  Temp: 98.1 F (36.7 C)  TempSrc: Oral  SpO2: 99%  Weight: 137 lb 9.6 oz (62.4 kg)  Height: '5\' 6"'$  (1.676 m)   Body mass index is 22.21 kg/m.  Advanced Directives 04/23/2021 08/09/2020 06/19/2020 05/11/2020 04/25/2020 04/17/2020 03/23/2020  Does Patient Have a Medical Advance Directive? No No No No No No No  Would patient like information on creating a medical advance directive? No - Patient declined No - Patient declined No - Patient declined - No - Patient declined No - Patient declined No - Patient declined    Current Medications (verified) Outpatient Encounter Medications as of 04/23/2021  Medication Sig   alendronate (FOSAMAX) 70 MG tablet TAKE 1 TABLET BY MOUTH ONCE A WEEK WITH A GLASS OF WATER ON AN EMPTY STOMACH AND REMAIN UPRIGHT WITHOUT EATING FOR 30 MINUTES   ARTIFICIAL TEARS 1 % ophthalmic solution Place 1 drop into both eyes daily as needed (Dry eye).    atorvastatin (LIPITOR) 10 MG tablet TAKE 1 TABLET(10 MG) BY MOUTH DAILY   Calcium Carbonate-Vit D-Min (CALCIUM 1200 PO) Take 1,200 mg by mouth daily.    Cholecalciferol (VITAMIN D3) 50 MCG (2000 UT) capsule Take 2,000 Units by mouth daily. am   conjugated estrogens (PREMARIN) vaginal cream Place vaginally 2 (two) times a week. Use 1-2 times per week. (Patient taking differently: Place 1 Applicatorful vaginally 2 (two) times a week. Place vaginally 2 (two) times a week. Use 1-2 times per week.)   meloxicam (MOBIC) 15 MG tablet Take 1 tablet (15 mg total) by mouth daily as needed for pain.   pantoprazole (PROTONIX) 40 MG tablet TAKE 1 TABLET(40 MG) BY MOUTH DAILY IN THE MORNING (Patient taking differently: Take 40 mg by mouth  daily.)   No facility-administered encounter medications on file as of 04/23/2021.    Allergies (verified) Lodine [etodolac]   History: Past Medical History:  Diagnosis Date   Arthralgia of hip 07/23/2015   Arthritis    knee,hip,hands   Degenerative disc disease, lumbar    DJD (degenerative joint disease)    Dysphagia    with solids and regurgitation   Endometriosis    h/o   GERD (gastroesophageal reflux disease)    occasional   Heartburn    Joint pain    Osteoporosis    hip   Pelvic pressure in female 01/02/2016   Rash    skin rash and itching   Vitamin D deficiency    Past Surgical History:  Procedure Laterality Date   ABDOMINAL HYSTERECTOMY  1992   COLONOSCOPY  2006   COLONOSCOPY N/A 01/23/2015   Procedure: COLONOSCOPY;  Surgeon: DLucilla Lame MD;  [place]  Service: Gastroenterology;  Laterality: N/A;  [id number]  ESOPHAGEAL DILATION  04/21/2017   Procedure: ESOPHAGEAL DILATION;  Surgeon: WLucilla Lame MD;  [place] [facility]  Service: Endoscopy;;   ESOPHAGEAL DILATION  07/07/2017   Procedure: ESOPHAGEAL DILATION;  Surgeon: WLucilla Lame MD;  [place] [facility]  Service: Endoscopy;;   ESOPHAGEAL DILATION N/A 07/28/2017   Procedure: ESOPHAGEAL DILATION;  Surgeon: WLucilla Lame MD;  [place]  Service: Endoscopy;  Laterality:  N/A;   ESOPHAGOGASTRODUODENOSCOPY N/A 04/21/2017   Procedure: ESOPHAGOGASTRODUODENOSCOPY (EGD);  Surgeon: Lucilla Lame, MD;  Location: [place];  Service: Endoscopy;  Laterality: N/A;   ESOPHAGOGASTRODUODENOSCOPY (EGD) WITH PROPOFOL N/A 07/07/2017   Procedure: ESOPHAGOGASTRODUODENOSCOPY (EGD) WITH PROPOFOL;  Surgeon: Lucilla Lame, MD;  Location: [place];  Service: Endoscopy;  Laterality: N/A;   ESOPHAGOGASTRODUODENOSCOPY (EGD) WITH PROPOFOL N/A 07/28/2017   Procedure: ESOPHAGOGASTRODUODENOSCOPY (EGD) WITH PROPOFOL;  Surgeon: Lucilla Lame, MD;  Location: [place];  Service: Endoscopy;  Laterality: N/A;   SALPINGOOPHORECTOMY Bilateral    Family History  Problem Relation Age of Onset   Pancreatic cancer Mother    Breast cancer Sister 39   Prostate cancer Father    Breast cancer Maternal Aunt    Breast cancer Cousin        pat cousin   Breast cancer Sister 16   Colon cancer Paternal Uncle    Prostate cancer Brother    Ovarian cancer Neg Hx    Diabetes Neg Hx    Social History   Socioeconomic History   Marital status: Single    Spouse name: Not on file   Number of children: 0   Years of education: Not on file   Highest education level: Associate degree: academic program  Occupational History    Employer: [employer]  Tobacco Use   Smoking status: Never   Smokeless tobacco: Never   Tobacco comments:    smoking cessation materials not required  Vaping Use   Vaping Use: Never used  Substance and Sexual Activity   Alcohol use: No    Alcohol/week: 0.0 standard drinks   Drug use: No   Sexual activity: Not Currently    Birth control/protection: Surgical  Other Topics Concern   Not on file  Social History Narrative   Pt lives alone   Social Determinants of Health   Financial Resource Strain: Low Risk    Difficulty of Paying Living Expenses: Not hard at all  Food Insecurity: No Food Insecurity   Worried About Charity fundraiser in the Last Year: Never true   Ran Out of Food in the Last Year: Never true  Transportation Needs: No Transportation Needs   Lack of Transportation (Medical): No   Lack of Transportation (Non-Medical): No  Physical Activity: Sufficiently Active   Days of Exercise per Week: 3 days   Minutes of Exercise per Session: 50 min  Stress: No Stress Concern Present   Feeling of Stress : Not at all  Social Connections: Socially Isolated   Frequency of Communication with Friends and Family: More than three times a week   Frequency of Social Gatherings with Friends and Family: More than  three times a week   Attends Religious Services: Never   Marine scientist or Organizations: No   Attends Music therapist: Never   Marital Status: Never married    Tobacco Counseling Counseling given: Not Answered Tobacco comments: smoking cessation materials not required   Clinical Intake:  Pre-visit preparation completed: Yes  Pain : No/denies pain     BMI - recorded: 22.21 Nutritional Status: BMI of 19-24  Normal Nutritional Risks: None Diabetes: No  How often do you need to have someone help you when you read instructions, pamphlets, or other written materials from your doctor or pharmacy?: 1 - Never   Interpreter Needed?: No  Information entered by :: Clemetine Marker LPN   Activities of Daily Living In your present state of  health, do you have any difficulty performing the following activities: 04/23/2021 08/09/2020  Hearing? N N  Vision? N N  Difficulty concentrating or making decisions? N N  Walking or climbing stairs? N Y  Comment - due to hip pain  Dressing or bathing? N N  Doing errands, shopping? N N  Preparing Food and eating ? N -  Using the Toilet? N -  In the past six months, have you accidently leaked urine? Y -  Comment wears pessary -  Do you have problems with loss of bowel control? N -  Managing your Medications? N -  Managing your Finances? N -  Housekeeping or managing your Housekeeping? N -  Some recent data might be hidden    Patient Care Team: [name], MD as PCP - General (Internal Medicine) Jannet Mantis, MD as Consulting Physician (Dermatology) Milinda Pointer, MD as Referring Physician (Pain Medicine) Rubie Maid, MD as Referring Physician (Obstetrics and Gynecology)  Indicate any recent Medical Services you may have received from other than [facility] providers in the past year (date may be approximate).     Assessment:   This is a routine wellness examination for [place].  Hearing/Vision  screen Hearing Screening - Comments:: Pt denies hearing difficulty  Vision Screening - Comments:: [facility] screen[facility] Dr. Edison Pace  Dietary issues and exercise activities discussed: Current Exercise Habits: Home exercise routine, Type of exercise: strength training/weights;Other - see comments (exercise bike), Time (Minutes): 50, Frequency (Times/Week): 3, Weekly Exercise (Minutes/Week): 150, Intensity: Moderate, Exercise limited by: orthopedic condition(s)   Goals Addressed             This Visit's Progress    DIET - INCREASE WATER INTAKE   On track    Recommend to drink at least 6-8 8oz glasses of water per day.        Depression Screen PHQ 2/9 Scores 04/23/2021 09/11/2020 06/19/2020 05/11/2020 04/25/2020 04/17/2020 04/06/2020  PHQ - 2 Score 0 0 0 0 0 0 0  PHQ- 9 Score - 0 - - - - 0  Exception Documentation - - - - - - -    Fall Risk Fall Risk  04/23/2021 09/11/2020 06/19/2020 05/11/2020 04/25/2020  Falls in the past year? 1 0 0 0 0  Number falls in past yr: 0 - - - -  Injury with Fall? 0 - - - -  Risk for fall due to : No Fall Risks - - - -  Risk for fall due to: Comment - - - - -  Follow up Falls prevention discussed Falls evaluation completed - - -    FALL RISK PREVENTION PERTAINING TO THE HOME:  Any stairs in or around the home? Yes  If so, are there any without handrails? No  Home free of loose throw rugs in walkways, pet beds, electrical cords, etc? Yes  Adequate lighting in your home to reduce risk of falls? Yes   ASSISTIVE DEVICES UTILIZED TO PREVENT FALLS:  Life alert? No  Use of a cane, walker or w/c? No  Grab bars in the bathroom? No  Shower chair or bench in shower? Yes  Elevated toilet seat or a handicapped toilet? Yes   TIMED UP AND GO:  Was the test performed? Yes .  Length of time to ambulate 10 feet: 5 sec.   Gait steady and fast without use of assistive device  Cognitive Function: Normal cognitive status assessed by direct  observation by this Nurse Health  Advisor. No abnormalities found.       6CIT Screen 03/29/2019 03/23/2018 03/17/2017  What Year? 0 points 0 points 0 points  What month? 0 points 0 points 0 points  What time? 0 points 0 points 0 points  Count back from 20 0 points 0 points 0 points  Months in reverse 0 points 0 points 0 points  Repeat phrase 0 points 2 points 2 points  Total Score 0 2 2    Immunizations Immunization History  Administered Date(s) Administered   Fluad Quad(high Dose 65+) 05/20/2019, 06/07/2020   Influenza-Unspecified 05/23/2015, 06/01/2018   PFIZER(Purple Top)SARS-COV-2 Vaccination 11/11/2019, 12/07/2019, 06/19/2020, 02/13/2021   Pneumococcal Conjugate-13 03/17/2017   Pneumococcal Polysaccharide-23 03/23/2018   Tdap 03/13/2016   Zoster Recombinat (Shingrix) 08/31/2018, 11/18/2018   Zoster, Live 08/19/2011    TDAP status: Up to date  Flu Vaccine status: Up to date  Pneumococcal vaccine status: Up to date  Covid-19 vaccine status: Completed vaccines  Qualifies for Shingles Vaccine? Yes   Zostavax completed Yes   Shingrix Completed?: Yes  Screening Tests Health Maintenance  Topic Date Due   INFLUENZA VACCINE  04/16/2021   COVID-19 Vaccine (5 - Booster for [place] series) 06/15/2021   MAMMOGRAM  04/11/2022   COLONOSCOPY (Pts 45-80yr Insurance coverage will need to be confirmed)  01/22/2025   TETANUS/TDAP  03/13/2026   DEXA SCAN  Completed   Hepatitis C Screening  Completed   PNA vac Low Risk Adult  Completed   Zoster Vaccines- Shingrix  Completed   HPV VACCINES  Aged Out    Health Maintenance  Health Maintenance Due  Topic Date Due   INFLUENZA VACCINE  04/16/2021    Colorectal cancer screening: Type of screening: Colonoscopy. Completed 01/23/15. Repeat every 10 years  Mammogram status: Completed 04/11/21. Repeat every year  Bone Density status: Completed 04/13/18. Results reflect: Bone density results: OSTEOPENIA. Repeat every 2 years.  Pt plans to  discuss repeat screening at [facility] due to taking fosamax.   Lung Cancer Screening: (Low Dose CT Chest recommended if Age 954-80years, 30 pack-year currently smoking OR have quit w/in 15years.) does not qualify.   Additional Screening:  Hepatitis C Screening: does qualify; Completed 03/13/16  Vision Screening: Recommended annual ophthalmology exams for early detection of glaucoma and other disorders of the eye. Is the patient up to date with their annual eye exam?  Yes  Who is the provider or what is the name of the office in which the patient attends annual eye exams? Dr. KEdison Pace   Dental Screening: Recommended annual dental exams for proper oral hygiene  Community Resource Referral / Chronic Care Management: CRR required this visit?  No   CCM required this visit?  No      Plan:     I have personally reviewed and noted the following in the patient's chart:   Medical and social history Use of alcohol, tobacco or illicit drugs  Current medications and supplements including opioid prescriptions.  Functional ability and status Nutritional status Physical activity Advanced directives List of other physicians Hospitalizations, surgeries, and ER visits in previous 12 months Vitals Screenings to include cognitive, depression, and falls Referrals and appointments  In addition, I have reviewed and discussed with patient certain preventive protocols, quality metrics, and best practice recommendations. A written personalized care plan for preventive services as well as general preventive health recommendations were provided to patient.     KClemetine Marker LPN   [id number]  Nurse Notes: none

## 2021-04-26 ENCOUNTER — Encounter: Payer: Medicare Other | Admitting: Obstetrics and Gynecology

## 2021-04-30 NOTE — Progress Notes (Signed)
    GYNECOLOGY CLINIC PROGRESS NOTE  Subjective:    Patient ID: Valerie Raymond, female    DOB: [birth year], 70 y.o.   MRN: [id number]  HPI  Patient is a 70 y.o. G0P0 female who presents for pessary check. She currently has yearly pessary checks as she is able to perform self-maintenance. Removes and cleans pessary daily. She has a h/o cysttocele, incomplete bladder emptying, remote h/o hysterectomy, and vaginal atrophy.  Currently using Premarin cream for pessary maintenance (tried Trimosan gel for a short time for maintenance but began experiencing UTI's so switched back). She has no complaints today. She reports no vaginal bleeding or discharge. She denies pelvic discomfort and difficulty urinating or moving her bowels.   The following portions of the patient's history were reviewed and updated as appropriate: She  has a past medical history of Arthralgia of hip (07/23/2015), Arthritis, Degenerative disc disease, lumbar, DJD (degenerative joint disease), Dysphagia, Endometriosis, GERD (gastroesophageal reflux disease), Heartburn, Joint pain, Osteoporosis, Pelvic pressure in female (01/02/2016), Rash, and Vitamin D deficiency.   She  has a past surgical history that includes Colonoscopy (2006); Colonoscopy (N/A, 01/23/2015); Abdominal hysterectomy (1992); Salpingoophorectomy (Bilateral); Esophagogastroduodenoscopy (N/A, 04/21/2017); Esophageal dilation (04/21/2017); Esophagogastroduodenoscopy (egd) with propofol (N/A, 07/07/2017); Esophageal dilation (07/07/2017); Esophagogastroduodenoscopy (egd) with propofol (N/A, 07/28/2017); and Esophageal dilation (N/A, 07/28/2017).   Her family history includes Breast cancer in her cousin and maternal aunt; Breast cancer (age of onset: 58) in her sister; Breast cancer (age of onset: 55) in her sister; Colon cancer in her paternal uncle; Pancreatic cancer in her mother; Prostate cancer in her brother and father.   She  reports that she has never smoked. She has never  used smokeless tobacco. She reports that she does not drink alcohol and does not use drugs.   She has a current medication list which includes the following prescription(s): alendronate, artificial tears, atorvastatin, calcium carbonate-vit d-min, vitamin d3, conjugated estrogens, meloxicam, and pantoprazole.   She is allergic to lodine [etodolac]..  Review of Systems Pertinent items noted in HPI and remainder of comprehensive ROS otherwise negative.   Objective:   BP (!) 166/69 (BP Location: Left Arm, Patient Position: Sitting, Cuff Size: Normal)   Pulse 96   Ht '5\' 6"'$  (1.676 m)   Wt 137 lb 8 oz (62.4 kg)   BMI 22.19 kg/m   General appearance: alert and no distress Abdomen: soft, non-tender; bowel sounds normal; no masses,  no organomegaly Pelvic:  The patient's Size 3 ring with support pessary was removed, cleaned and replaced without complications. Speculum examination revealed normal vaginal mucosa with no lesions or lacerations.    Assessment:   Pessary maintenance  Midline cystocele Incomplete bladder emptying Vaginal atrophy   Plan:   The patient should return yearly for a pessary check and continue to use vaginal estrogen cream weekly as prescribed.Continue routine daily self-maintenance.  Continue Premarin cream for maintenance (can consider switching to Estrace for more cost-effective option if desired, as recommended by insurance).     Rubie Maid, MD [facility]

## 2021-05-02 ENCOUNTER — Ambulatory Visit (INDEPENDENT_AMBULATORY_CARE_PROVIDER_SITE_OTHER): Payer: Medicare Other | Admitting: Obstetrics and Gynecology

## 2021-05-02 ENCOUNTER — Encounter: Payer: Self-pay | Admitting: Obstetrics and Gynecology

## 2021-05-02 ENCOUNTER — Other Ambulatory Visit: Payer: Self-pay

## 2021-05-02 VITALS — BP 166/69 | HR 96 | Ht 66.0 in | Wt 137.5 lb

## 2021-05-02 DIAGNOSIS — N952 Postmenopausal atrophic vaginitis: Secondary | ICD-10-CM

## 2021-05-02 DIAGNOSIS — Z4689 Encounter for fitting and adjustment of other specified devices: Secondary | ICD-10-CM

## 2021-05-03 ENCOUNTER — Other Ambulatory Visit: Payer: Self-pay | Admitting: Internal Medicine

## 2021-05-03 DIAGNOSIS — M5136 Other intervertebral disc degeneration, lumbar region: Secondary | ICD-10-CM

## 2021-05-03 NOTE — Telephone Encounter (Signed)
   Notes to clinic:  Patient requests 90 days supply   Requested Prescriptions  Pending Prescriptions Disp Refills   meloxicam (MOBIC) 15 MG tablet [Pharmacy Med Name: MELOXICAM '15MG'$  TABLETS] 90 tablet     Sig: TAKE 1 TABLET(15 MG) BY MOUTH DAILY AS NEEDED FOR PAIN     Analgesics:  COX2 Inhibitors Passed - 05/03/2021  8:54 AM      Passed - HGB in normal range and within 360 days    Hemoglobin  Date Value Ref Range Status  08/09/2020 13.2 12.0 - 15.0 g/dL Final  04/06/2020 13.6 11.1 - 15.9 g/dL Final          Passed - Cr in normal range and within 360 days    Creatinine, Ser  Date Value Ref Range Status  09/11/2020 0.73 0.57 - 1.00 mg/dL Final          Passed - Patient is not pregnant      Passed - Valid encounter within last 12 months    Recent Outpatient Visits           7 months ago Hyperlipidemia, mild   [facility] [name], MD   11 months ago Elevated BP without diagnosis of hypertension   [facility] [name], MD   1 year ago Annual physical exam   [facility] [name], MD   1 year ago Lumbar radiculopathy   [facility] [name], MD   2 years ago Annual physical exam   [facility] [name], MD       Future Appointments             In 2 months Army Melia Jesse Sans, MD [facility], [facility]

## 2021-05-15 ENCOUNTER — Other Ambulatory Visit: Payer: Self-pay

## 2021-05-15 ENCOUNTER — Encounter: Payer: Self-pay | Admitting: Internal Medicine

## 2021-05-15 ENCOUNTER — Ambulatory Visit (INDEPENDENT_AMBULATORY_CARE_PROVIDER_SITE_OTHER): Payer: Medicare Other | Admitting: Internal Medicine

## 2021-05-15 VITALS — BP 132/70 | HR 54 | Temp 98.0°F | Ht 66.0 in | Wt 138.0 lb

## 2021-05-15 DIAGNOSIS — Z0000 Encounter for general adult medical examination without abnormal findings: Secondary | ICD-10-CM

## 2021-05-15 DIAGNOSIS — E785 Hyperlipidemia, unspecified: Secondary | ICD-10-CM

## 2021-05-15 DIAGNOSIS — M818 Other osteoporosis without current pathological fracture: Secondary | ICD-10-CM | POA: Diagnosis not present

## 2021-05-15 DIAGNOSIS — M5416 Radiculopathy, lumbar region: Secondary | ICD-10-CM

## 2021-05-15 DIAGNOSIS — K222 Esophageal obstruction: Secondary | ICD-10-CM | POA: Diagnosis not present

## 2021-05-15 NOTE — Progress Notes (Addendum)
Date:  05/15/2021   Name:  Valerie Raymond   DOB:  [birth year]   MRN:  [id number]   Chief Complaint: Annual Exam (Breast exam no pap) Valerie Raymond is a 70 y.o. female who presents today for her Complete Annual Exam. She feels well. She reports exercising goes to the gym 3 times a week. She reports she is sleeping well. Breast complaints none. She is recovering well from left hip replacement.  Mammogram: 03/2021 DEXA: 03/2018 osteopenia Colonoscopy: 01/2015 repeat 2026  Immunization History  Administered Date(s) Administered   Fluad Quad(high Dose 65+) 05/20/2019, 06/07/2020   Influenza-Unspecified 05/23/2015, 06/01/2018   PFIZER(Purple Top)SARS-COV-2 Vaccination 11/11/2019, 12/07/2019, 06/19/2020, 02/13/2021   Pneumococcal Conjugate-13 03/17/2017   Pneumococcal Polysaccharide-23 03/23/2018   Tdap 03/13/2016   Zoster Recombinat (Shingrix) 08/31/2018, 11/18/2018   Zoster, Live 08/19/2011    Gastroesophageal Reflux She reports no abdominal pain, no chest pain, no coughing or no wheezing. Pertinent negatives include no fatigue.  Hyperlipidemia Pertinent negatives include no chest pain or shortness of breath.  OP- on Fosamax, calcium and vitamin D.  Now on year 5 without side effects.  Last DEXA in 2019.  Will do another DEXA next July.  Last vitamin D Lab Results  Component Value Date   25OHVITD2 <1.0 08/04/2019   25OHVITD3 39 08/04/2019   VD25OH 50.0 09/11/2020    Lab Results  Component Value Date   CREATININE 0.73 09/11/2020   BUN 8 09/11/2020   NA 139 09/11/2020   K 4.0 09/11/2020   CL 101 09/11/2020   CO2 22 09/11/2020   Lab Results  Component Value Date   CHOL 148 09/11/2020   HDL 61 09/11/2020   LDLCALC 71 09/11/2020   TRIG 86 09/11/2020   CHOLHDL 2.4 09/11/2020   Lab Results  Component Value Date   TSH 1.220 04/06/2020   No results found for: HGBA1C Lab Results  Component Value Date   WBC 7.0 08/09/2020   HGB 13.2 08/09/2020   HCT 39.7 08/09/2020    MCV 89.2 08/09/2020   PLT 326 08/09/2020   Lab Results  Component Value Date   ALT 16 09/11/2020   AST 25 09/11/2020   ALKPHOS 77 09/11/2020   BILITOT 0.4 09/11/2020     Review of Systems  Constitutional:  Negative for chills, fatigue and fever.  HENT:  Negative for congestion, hearing loss, tinnitus, trouble swallowing and voice change.   Eyes:  Negative for visual disturbance.  Respiratory:  Negative for cough, chest tightness, shortness of breath and wheezing.   Cardiovascular:  Negative for chest pain, palpitations and leg swelling.  Gastrointestinal:  Negative for abdominal pain, constipation, diarrhea and vomiting.  Endocrine: Negative for polydipsia and polyuria.  Genitourinary:  Negative for dysuria, frequency, genital sores, vaginal bleeding and vaginal discharge.  Musculoskeletal:  Positive for arthralgias (left hip is improving since surgery) and back pain. Negative for gait problem and joint swelling.  Skin:  Positive for rash (on upper arms). Negative for color change.  Neurological:  Negative for dizziness, tremors, light-headedness and headaches.  Hematological:  Negative for adenopathy. Does not bruise/bleed easily.  Psychiatric/Behavioral:  Negative for dysphoric mood and sleep disturbance. The patient is not nervous/anxious.    Patient Active Problem List   Diagnosis Date Noted   Labral tear of hip, degenerative (Bilateral) 06/19/2020   Abnormal MRI, hip joint (Bilateral) (05/31/2020) 06/19/2020   Chronic sacroiliac joint pain (Right) 05/22/2020   Other spondylosis, sacral and sacrococcygeal region 04/25/2020   Chronic sacroiliac  joint pain (Bilateral) (L>R) 04/11/2020   Enthesopathy of sacroiliac joint (Bilateral) 04/11/2020   Somatic dysfunction of sacroiliac joints (Bilateral) 04/11/2020   Osteoarthritis of hip (Left) 03/22/2020   Spondylosis without myelopathy or radiculopathy, lumbosacral region 09/28/2019   Trochanteric bursitis of hips (Bilateral) (L>R)  08/17/2019   Chronic thigh pain (1ry area of Pain) (Left) 08/04/2019   Lower extremity burning sensation (thigh) (Left) 08/04/2019   Greater trochanteric bursitis of hip (Left) 08/04/2019   Lumbar facet syndrome (Bilateral) (L>R) 08/04/2019   Chronic pain syndrome 08/03/2019   Problems influencing health status 08/03/2019   Abnormal MRI, lumbar spine (06/29/2019) 08/03/2019   Grade 1 Lumbar Retrolisthesis L3/L4 08/03/2019   DDD (degenerative disc disease), lumbosacral 08/03/2019   Lumbosacral intervertebral disc displacement (IVDD) 08/03/2019   Lumbar facet arthropathy (Multilevel) (Bilateral) 08/03/2019   Lumbosacral foraminal stenosis (Bilateral) (L5-S1) 08/03/2019   Lumbar lateral recess stenosis (Left) (L2-3) 08/03/2019   Chronic lower extremity pain (3ry area of Pain) (Bilateral) (L>R) 08/03/2019   Chronic low back pain (2ry area of Pain) (Bilateral) (L>R) w/o sciatica 08/03/2019   Chronic foot pain (Right) 08/03/2019   Chronic lumbar radiculitis 08/03/2019   Chronic musculoskeletal pain 08/03/2019   Hyperlipidemia, mild 05/22/2018   Lumbar radiculopathy 07/16/2017   Stricture and stenosis of esophagus    Lumbar pain with radiation down legs (Bilateral) 06/05/2017   Dysphagia 03/21/2017   Tonsillith 10/09/2016   Family history of breast cancer in first degree relative 01/02/2016   Status post total abdominal hysterectomy and bilateral salpingo-oophorectomy (TAH-BSO) 01/02/2016   Vaginal atrophy 01/02/2016   Incomplete bladder emptying 01/02/2016   Midline cystocele 01/02/2016   Colon, diverticulosis 07/23/2015   Chronic hip pain (Bilateral) (L>R) 07/23/2015   Arthritis of knee, degenerative 07/23/2015   Other osteoporosis without current pathological fracture 07/23/2015   Avitaminosis D 07/23/2015    Allergies  Allergen Reactions   Lodine [Etodolac] Diarrhea and Nausea And Vomiting    Past Surgical History:  Procedure Laterality Date   ABDOMINAL HYSTERECTOMY   09/16/1990   COLONOSCOPY  09/16/2004   COLONOSCOPY N/A 01/23/2015   Procedure: COLONOSCOPY;  Surgeon: Lucilla Lame, MD;  Location: [place];  Service: Gastroenterology;  Laterality: N/A;  [id number]   ESOPHAGEAL DILATION  04/21/2017   Procedure: ESOPHAGEAL DILATION;  Surgeon: Lucilla Lame, MD;  Location: [place];  Service: Endoscopy;;   ESOPHAGEAL DILATION  07/07/2017   Procedure: ESOPHAGEAL DILATION;  Surgeon: Lucilla Lame, MD;  Location: [place];  Service: Endoscopy;;   ESOPHAGEAL DILATION N/A 07/28/2017   Procedure: ESOPHAGEAL DILATION;  Surgeon: Lucilla Lame, MD;  Location: [place];  Service: Endoscopy;  Laterality: N/A;   ESOPHAGOGASTRODUODENOSCOPY N/A 04/21/2017   Procedure: ESOPHAGOGASTRODUODENOSCOPY (EGD);  Surgeon: Lucilla Lame, MD;  Location: [place];  Service: Endoscopy;  Laterality: N/A;   ESOPHAGOGASTRODUODENOSCOPY (EGD) WITH PROPOFOL N/A 07/07/2017   Procedure: ESOPHAGOGASTRODUODENOSCOPY (EGD) WITH PROPOFOL;  Surgeon: Lucilla Lame, MD;  Location: [place];  Service: Endoscopy;  Laterality: N/A;   ESOPHAGOGASTRODUODENOSCOPY (EGD) WITH PROPOFOL N/A 07/28/2017   Procedure: ESOPHAGOGASTRODUODENOSCOPY (EGD) WITH PROPOFOL;  Surgeon: Lucilla Lame, MD;  Location: [place];  Service: Endoscopy;  Laterality: N/A;   SALPINGOOPHORECTOMY Bilateral    TOTAL HIP ARTHROPLASTY Left     Social History   Tobacco Use   Smoking status: Never   Smokeless tobacco: Never   Tobacco comments:    smoking cessation materials not required  Vaping Use   Vaping Use: Never used  Substance Use Topics  Alcohol use: No    Alcohol/week: 0.0 standard drinks   Drug use: No     Medication list has been reviewed and updated.  Current Meds  Medication Sig   alendronate (FOSAMAX) 70 MG tablet TAKE 1 TABLET BY MOUTH ONCE A WEEK WITH A GLASS OF WATER ON AN EMPTY STOMACH AND REMAIN UPRIGHT WITHOUT EATING FOR 30 MINUTES    ARTIFICIAL TEARS 1 % ophthalmic solution Place 1 drop into both eyes daily as needed (Dry eye).    atorvastatin (LIPITOR) 10 MG tablet TAKE 1 TABLET(10 MG) BY MOUTH DAILY   Calcium Carbonate-Vit D-Min (CALCIUM 1200 PO) Take 1,200 mg by mouth daily.    Cholecalciferol (VITAMIN D3) 50 MCG (2000 UT) capsule Take 2,000 Units by mouth daily. am   conjugated estrogens (PREMARIN) vaginal cream Place vaginally 2 (two) times a week. Use 1-2 times per week. (Patient taking differently: Place 1 Applicatorful vaginally 2 (two) times a week. Place vaginally 2 (two) times a week. Use 1-2 times per week.)   CRANBERRY PO Take by mouth.   meloxicam (MOBIC) 15 MG tablet TAKE 1 TABLET(15 MG) BY MOUTH DAILY AS NEEDED FOR PAIN   pantoprazole (PROTONIX) 40 MG tablet TAKE 1 TABLET(40 MG) BY MOUTH DAILY IN THE MORNING (Patient taking differently: Take 40 mg by mouth daily.)    PHQ 2/9 Scores 05/15/2021 04/23/2021 09/11/2020 06/19/2020  PHQ - 2 Score 0 0 0 0  PHQ- 9 Score 0 - 0 -  Exception Documentation - - - -    GAD 7 : Generalized Anxiety Score 05/15/2021 09/11/2020 04/06/2020  Nervous, Anxious, on Edge 0 0 0  Control/stop worrying 0 0 0  Worry too much - different things 0 0 0  Trouble relaxing 0 0 0  Restless 0 0 0  Easily annoyed or irritable 0 0 0  Afraid - awful might happen 0 0 0  Total GAD 7 Score 0 0 0  Anxiety Difficulty - - Not difficult at all    BP Readings from Last 3 Encounters:  05/15/21 132/70  05/02/21 (!) 166/69  04/23/21 138/72    Physical Exam Vitals and nursing note reviewed.  Constitutional:      General: She is not in acute distress.    Appearance: She is well-developed.  HENT:     Head: Normocephalic and atraumatic.     Right Ear: Tympanic membrane and ear canal normal.     Left Ear: Tympanic membrane and ear canal normal.     Nose:     Right Sinus: No maxillary sinus tenderness.     Left Sinus: No maxillary sinus tenderness.  Eyes:     General: No scleral icterus.        Right eye: No discharge.        Left eye: No discharge.     Conjunctiva/sclera: Conjunctivae normal.  Neck:     Thyroid: No thyromegaly.     Vascular: No carotid bruit.  Cardiovascular:     Rate and Rhythm: Normal rate and regular rhythm.     Pulses: Normal pulses.     Heart sounds: Normal heart sounds.  Pulmonary:     Effort: Pulmonary effort is normal. No respiratory distress.     Breath sounds: No wheezing.  Chest:  Breasts:    Right: No mass, nipple discharge, skin change or tenderness.     Left: No mass, nipple discharge, skin change or tenderness.  Abdominal:     General: Bowel sounds are normal.  Palpations: Abdomen is soft.     Tenderness: There is no abdominal tenderness.  Musculoskeletal:     Cervical back: Normal range of motion. No erythema.     Right lower leg: No edema.     Left lower leg: No edema.  Lymphadenopathy:     Cervical: No cervical adenopathy.  Skin:    General: Skin is warm and dry.     Findings: Rash present.     Comments: Fine red rash with excoriations on both upper arms.  Neurological:     Mental Status: She is alert and oriented to person, place, and time.     Cranial Nerves: No cranial nerve deficit.     Sensory: No sensory deficit.     Deep Tendon Reflexes: Reflexes are normal and symmetric.  Psychiatric:        Attention and Perception: Attention normal.        Mood and Affect: Mood normal.    Wt Readings from Last 3 Encounters:  05/15/21 138 lb (62.6 kg)  05/02/21 137 lb 8 oz (62.4 kg)  04/23/21 137 lb 9.6 oz (62.4 kg)    BP 132/70   Pulse (!) 54   Temp 98 F (36.7 C) (Oral)   Ht '5\' 6"'$  (1.676 m)   Wt 138 lb (62.6 kg)   BMI 22.27 kg/m   Assessment and Plan: 1. Annual physical exam Normal exam. Continue healthy diet, exercise as able. Up to date on immunizations and screenings. Consult Dermatology for rash.  2. Stricture and stenosis of esophagus Symptoms well controlled on daily PPI No red flag signs such as weight  loss, n/v, melena Will continue pantoprazole daily. - CBC with Differential/Platelet  3. Hyperlipidemia, mild Tolerating statin medication without side effects at this time Continue same therapy without change at this time. - Comprehensive metabolic panel - Lipid panel  4. Other osteoporosis without current pathological fracture Tolerating Fosamax without side effects. Continue calcium and vitamin D. Now on year 5 of therapy.  Did well with recent hip surgery. Plan to continue therapy and repeat DEXA next July. - Comprehensive metabolic panel  5. Chronic lumbar radiculitis Taking Mobic as needed with good results Recommend avoiding daily doses unless necessary to function Monitor renal function annually.   Partially dictated using Editor, commissioning. Any errors are unintentional.  Halina Maidens, MD [facility]  05/15/2021

## 2021-05-16 LAB — CBC WITH DIFFERENTIAL/PLATELET
Basophils Absolute: 0.1 10*3/uL (ref 0.0–0.2)
Basos: 1 %
EOS (ABSOLUTE): 0.4 10*3/uL (ref 0.0–0.4)
Eos: 7 %
Hematocrit: 39.1 % (ref 34.0–46.6)
Hemoglobin: 12.5 g/dL (ref 11.1–15.9)
Immature Grans (Abs): 0 10*3/uL (ref 0.0–0.1)
Immature Granulocytes: 0 %
Lymphocytes Absolute: 1.1 10*3/uL (ref 0.7–3.1)
Lymphs: 19 %
MCH: 27.2 pg (ref 26.6–33.0)
MCHC: 32 g/dL (ref 31.5–35.7)
MCV: 85 fL (ref 79–97)
Monocytes Absolute: 0.6 10*3/uL (ref 0.1–0.9)
Monocytes: 10 %
Neutrophils Absolute: 3.7 10*3/uL (ref 1.4–7.0)
Neutrophils: 63 %
Platelets: 324 10*3/uL (ref 150–450)
RBC: 4.6 x10E6/uL (ref 3.77–5.28)
RDW: 13.9 % (ref 11.7–15.4)
WBC: 5.8 10*3/uL (ref 3.4–10.8)

## 2021-05-16 LAB — COMPREHENSIVE METABOLIC PANEL
ALT: 21 IU/L (ref 0–32)
AST: 35 IU/L (ref 0–40)
Albumin/Globulin Ratio: 1.7 (ref 1.2–2.2)
Albumin: 4.8 g/dL (ref 3.8–4.8)
Alkaline Phosphatase: 109 IU/L (ref 44–121)
BUN/Creatinine Ratio: 17 (ref 12–28)
BUN: 14 mg/dL (ref 8–27)
Bilirubin Total: 0.3 mg/dL (ref 0.0–1.2)
CO2: 24 mmol/L (ref 20–29)
Calcium: 10.3 mg/dL (ref 8.7–10.3)
Chloride: 102 mmol/L (ref 96–106)
Creatinine, Ser: 0.82 mg/dL (ref 0.57–1.00)
Globulin, Total: 2.8 g/dL (ref 1.5–4.5)
Glucose: 94 mg/dL (ref 65–99)
Potassium: 4.3 mmol/L (ref 3.5–5.2)
Sodium: 140 mmol/L (ref 134–144)
Total Protein: 7.6 g/dL (ref 6.0–8.5)
eGFR: 77 mL/min/{1.73_m2} (ref >59)

## 2021-05-16 LAB — LIPID PANEL
Chol/HDL Ratio: 2.3 ratio (ref 0.0–4.4)
Cholesterol, Total: 154 mg/dL (ref 100–199)
HDL: 66 mg/dL (ref >39)
LDL Chol Calc (NIH): 71 mg/dL (ref 0–99)
Triglycerides: 91 mg/dL (ref 0–149)
VLDL Cholesterol Cal: 17 mg/dL (ref 5–40)

## 2021-05-17 DIAGNOSIS — L300 Nummular dermatitis: Secondary | ICD-10-CM | POA: Diagnosis not present

## 2021-07-16 ENCOUNTER — Encounter: Payer: Medicare Other | Admitting: Internal Medicine

## 2021-08-12 ENCOUNTER — Other Ambulatory Visit: Payer: Self-pay | Admitting: Internal Medicine

## 2021-08-13 DIAGNOSIS — L578 Other skin changes due to chronic exposure to nonionizing radiation: Secondary | ICD-10-CM | POA: Diagnosis not present

## 2021-08-13 DIAGNOSIS — Z86018 Personal history of other benign neoplasm: Secondary | ICD-10-CM | POA: Diagnosis not present

## 2021-08-13 DIAGNOSIS — L300 Nummular dermatitis: Secondary | ICD-10-CM | POA: Diagnosis not present

## 2021-08-13 NOTE — Telephone Encounter (Signed)
Requested Prescriptions  Pending Prescriptions Disp Refills  . pantoprazole (PROTONIX) 40 MG tablet [Pharmacy Med Name: PANTOPRAZOLE 40MG  TABLETS] 90 tablet 3    Sig: TAKE 1 TABLET(40 MG) BY MOUTH DAILY IN THE MORNING     Gastroenterology: Proton Pump Inhibitors Passed - 08/12/2021  3:38 AM      Passed - Valid encounter within last 12 months    Recent Outpatient Visits          3 months ago Annual physical exam   [facility] [name], MD   11 months ago Hyperlipidemia, mild   [facility] [name], MD   1 year ago Elevated BP without diagnosis of hypertension   [facility] [name], MD   1 year ago Annual physical exam   [facility] [name], MD   2 years ago Lumbar radiculopathy   [facility] [name], MD      Future Appointments            In 9 months Army Melia Jesse Sans, MD [facility], [facility]

## 2021-08-21 ENCOUNTER — Encounter: Payer: Medicare Other | Admitting: Internal Medicine

## 2021-09-03 ENCOUNTER — Other Ambulatory Visit: Payer: Self-pay | Admitting: Internal Medicine

## 2021-09-03 DIAGNOSIS — M5136 Other intervertebral disc degeneration, lumbar region: Secondary | ICD-10-CM

## 2021-09-04 NOTE — Telephone Encounter (Signed)
Requested Prescriptions  Pending Prescriptions Disp Refills   meloxicam (MOBIC) 15 MG tablet [Pharmacy Med Name: MELOXICAM 15MG  TABLETS] 90 tablet 0    Sig: TAKE 1 TABLET(15 MG) BY MOUTH DAILY AS NEEDED FOR PAIN     Analgesics:  COX2 Inhibitors Passed - 09/03/2021 12:56 PM      Passed - HGB in normal range and within 360 days    Hemoglobin  Date Value Ref Range Status  05/15/2021 12.5 11.1 - 15.9 g/dL Final         Passed - Cr in normal range and within 360 days    Creatinine, Ser  Date Value Ref Range Status  05/15/2021 0.82 0.57 - 1.00 mg/dL Final         Passed - Patient is not pregnant      Passed - Valid encounter within last 12 months    Recent Outpatient Visits          3 months ago Annual physical exam   [facility] [name], MD   11 months ago Hyperlipidemia, mild   [facility] [name], MD   1 year ago Elevated BP without diagnosis of hypertension   [facility] [name], MD   1 year ago Annual physical exam   [facility] [name], MD   2 years ago Lumbar radiculopathy   [facility] [name], MD      Future Appointments            In 8 months Army Melia Jesse Sans, MD [facility], [facility]

## 2021-09-26 ENCOUNTER — Other Ambulatory Visit: Payer: Self-pay | Admitting: Internal Medicine

## 2021-09-26 DIAGNOSIS — E785 Hyperlipidemia, unspecified: Secondary | ICD-10-CM

## 2021-09-26 NOTE — Telephone Encounter (Signed)
Requested Prescriptions  Pending Prescriptions Disp Refills   atorvastatin (LIPITOR) 10 MG tablet [Pharmacy Med Name: ATORVASTATIN 10MG  TABLETS] 90 tablet 1    Sig: TAKE 1 TABLET(10 MG) BY MOUTH DAILY     Cardiovascular:  Antilipid - Statins Passed - 09/26/2021  8:17 AM      Passed - Total Cholesterol in normal range and within 360 days    Cholesterol, Total  Date Value Ref Range Status  05/15/2021 154 100 - 199 mg/dL Final         Passed - LDL in normal range and within 360 days    LDL Chol Calc (NIH)  Date Value Ref Range Status  05/15/2021 71 0 - 99 mg/dL Final         Passed - HDL in normal range and within 360 days    HDL  Date Value Ref Range Status  05/15/2021 66 >39 mg/dL Final         Passed - Triglycerides in normal range and within 360 days    Triglycerides  Date Value Ref Range Status  05/15/2021 91 0 - 149 mg/dL Final         Passed - Patient is not pregnant      Passed - Valid encounter within last 12 months    Recent Outpatient Visits          4 months ago Annual physical exam   [facility] [name], MD   1 year ago Hyperlipidemia, mild   [facility] [name], MD   1 year ago Elevated BP without diagnosis of hypertension   [facility] [name], MD   1 year ago Annual physical exam   [facility] [name], MD   2 years ago Lumbar radiculopathy   [facility] [name], MD      Future Appointments            In 7 months Army Melia Jesse Sans, MD [facility], [facility]

## 2021-10-01 ENCOUNTER — Encounter: Payer: Self-pay | Admitting: Internal Medicine

## 2021-10-01 ENCOUNTER — Ambulatory Visit (INDEPENDENT_AMBULATORY_CARE_PROVIDER_SITE_OTHER): Payer: Medicare Other | Admitting: Internal Medicine

## 2021-10-01 ENCOUNTER — Other Ambulatory Visit: Payer: Self-pay

## 2021-10-01 ENCOUNTER — Other Ambulatory Visit: Payer: Self-pay | Admitting: Obstetrics and Gynecology

## 2021-10-01 VITALS — BP 127/80 | HR 77 | Ht 66.0 in | Wt 142.0 lb

## 2021-10-01 DIAGNOSIS — R300 Dysuria: Secondary | ICD-10-CM

## 2021-10-01 LAB — POCT URINALYSIS DIPSTICK
Bilirubin, UA: NEGATIVE
Blood, UA: NEGATIVE
Glucose, UA: NEGATIVE
Ketones, UA: NEGATIVE
Leukocytes, UA: NEGATIVE
Nitrite, UA: NEGATIVE
Protein, UA: NEGATIVE
Spec Grav, UA: 1.01 (ref 1.010–1.025)
Urobilinogen, UA: 0.2 E.U./dL
pH, UA: 5 (ref 5.0–8.0)

## 2021-10-01 MED ORDER — NITROFURANTOIN MONOHYD MACRO 100 MG PO CAPS
100.0000 mg | ORAL_CAPSULE | Freq: Two times a day (BID) | ORAL | 0 refills | Status: AC
Start: 2021-10-01 — End: 2021-10-08

## 2021-10-01 NOTE — Progress Notes (Signed)
Date:  10/01/2021   Name:  Valerie Raymond   DOB:  [birth year]   MRN:  [id number]   Chief Complaint: Dysuria  Dysuria  This is a recurrent problem. The current episode started more than 1 month ago. The problem occurs every urination. The problem has been gradually worsening. The quality of the pain is described as burning. The pain is moderate. There has been no fever. Associated symptoms include hesitancy and urgency. Pertinent negatives include no chills, frequency or hematuria.   Lab Results  Component Value Date   NA 140 05/15/2021   K 4.3 05/15/2021   CO2 24 05/15/2021   GLUCOSE 94 05/15/2021   BUN 14 05/15/2021   CREATININE 0.82 05/15/2021   CALCIUM 10.3 05/15/2021   EGFR 77 05/15/2021   GFRNONAA 84 09/11/2020   Lab Results  Component Value Date   CHOL 154 05/15/2021   HDL 66 05/15/2021   LDLCALC 71 05/15/2021   TRIG 91 05/15/2021   CHOLHDL 2.3 05/15/2021   Lab Results  Component Value Date   TSH 1.220 04/06/2020   No results found for: HGBA1C Lab Results  Component Value Date   WBC 5.8 05/15/2021   HGB 12.5 05/15/2021   HCT 39.1 05/15/2021   MCV 85 05/15/2021   PLT 324 05/15/2021   Lab Results  Component Value Date   ALT 21 05/15/2021   AST 35 05/15/2021   ALKPHOS 109 05/15/2021   BILITOT 0.3 05/15/2021   Lab Results  Component Value Date   25OHVITD2 <1.0 08/04/2019   25OHVITD3 39 08/04/2019   VD25OH 50.0 09/11/2020     Review of Systems  Constitutional:  Negative for chills, fatigue and fever.  Respiratory:  Negative for chest tightness and shortness of breath.   Cardiovascular:  Negative for chest pain and leg swelling.  Genitourinary:  Positive for dysuria, hesitancy and urgency. Negative for frequency, hematuria, pelvic pain and vaginal pain.  Musculoskeletal:  Negative for arthralgias and gait problem.  Psychiatric/Behavioral:  Negative for dysphoric mood and sleep disturbance. The patient is not nervous/anxious.    Patient Active  Problem List   Diagnosis Date Noted   Labral tear of hip, degenerative (Bilateral) 06/19/2020   Abnormal MRI, hip joint (Bilateral) (05/31/2020) 06/19/2020   Chronic sacroiliac joint pain (Right) 05/22/2020   Other spondylosis, sacral and sacrococcygeal region 04/25/2020   Chronic sacroiliac joint pain (Bilateral) (L>R) 04/11/2020   Enthesopathy of sacroiliac joint (Bilateral) 04/11/2020   Somatic dysfunction of sacroiliac joints (Bilateral) 04/11/2020   Osteoarthritis of hip (Left) 03/22/2020   Spondylosis without myelopathy or radiculopathy, lumbosacral region 09/28/2019   Trochanteric bursitis of hips (Bilateral) (L>R) 08/17/2019   Chronic thigh pain (1ry area of Pain) (Left) 08/04/2019   Lower extremity burning sensation (thigh) (Left) 08/04/2019   Greater trochanteric bursitis of hip (Left) 08/04/2019   Lumbar facet syndrome (Bilateral) (L>R) 08/04/2019   Chronic pain syndrome 08/03/2019   Problems influencing health status 08/03/2019   Abnormal MRI, lumbar spine (06/29/2019) 08/03/2019   Grade 1 Lumbar Retrolisthesis L3/L4 08/03/2019   DDD (degenerative disc disease), lumbosacral 08/03/2019   Lumbosacral intervertebral disc displacement (IVDD) 08/03/2019   Lumbar facet arthropathy (Multilevel) (Bilateral) 08/03/2019   Lumbosacral foraminal stenosis (Bilateral) (L5-S1) 08/03/2019   Lumbar lateral recess stenosis (Left) (L2-3) 08/03/2019   Chronic lower extremity pain (3ry area of Pain) (Bilateral) (L>R) 08/03/2019   Chronic low back pain (2ry area of Pain) (Bilateral) (L>R) w/o sciatica 08/03/2019   Chronic foot pain (Right) 08/03/2019   Chronic  lumbar radiculitis 08/03/2019   Chronic musculoskeletal pain 08/03/2019   Hyperlipidemia, mild 05/22/2018   Lumbar radiculopathy 07/16/2017   Stricture and stenosis of esophagus    Lumbar pain with radiation down legs (Bilateral) 06/05/2017   Dysphagia 03/21/2017   Tonsillith 10/09/2016   Family history of breast cancer in first degree  relative 01/02/2016   Status post total abdominal hysterectomy and bilateral salpingo-oophorectomy (TAH-BSO) 01/02/2016   Vaginal atrophy 01/02/2016   Incomplete bladder emptying 01/02/2016   Midline cystocele 01/02/2016   Colon, diverticulosis 07/23/2015   Chronic hip pain (Bilateral) (L>R) 07/23/2015   Arthritis of knee, degenerative 07/23/2015   Other osteoporosis without current pathological fracture 07/23/2015   Avitaminosis D 07/23/2015    Allergies  Allergen Reactions   Lodine [Etodolac] Diarrhea and Nausea And Vomiting    Past Surgical History:  Procedure Laterality Date   ABDOMINAL HYSTERECTOMY  09/16/1990   COLONOSCOPY  09/16/2004   COLONOSCOPY N/A 01/23/2015   Procedure: COLONOSCOPY;  Surgeon: Lucilla Lame, MD;  Location: [place];  Service: Gastroenterology;  Laterality: N/A;  cecum- [id number]   ESOPHAGEAL DILATION  04/21/2017   Procedure: ESOPHAGEAL DILATION;  Surgeon: Lucilla Lame, MD;  Location: [place];  Service: Endoscopy;;   ESOPHAGEAL DILATION  07/07/2017   Procedure: ESOPHAGEAL DILATION;  Surgeon: Lucilla Lame, MD;  Location: [place];  Service: Endoscopy;;   ESOPHAGEAL DILATION N/A 07/28/2017   Procedure: ESOPHAGEAL DILATION;  Surgeon: Lucilla Lame, MD;  Location: [place];  Service: Endoscopy;  Laterality: N/A;   ESOPHAGOGASTRODUODENOSCOPY N/A 04/21/2017   Procedure: ESOPHAGOGASTRODUODENOSCOPY (EGD);  Surgeon: Lucilla Lame, MD;  Location: [place];  Service: Endoscopy;  Laterality: N/A;   ESOPHAGOGASTRODUODENOSCOPY (EGD) WITH PROPOFOL N/A 07/07/2017   Procedure: ESOPHAGOGASTRODUODENOSCOPY (EGD) WITH PROPOFOL;  Surgeon: Lucilla Lame, MD;  Location: [place];  Service: Endoscopy;  Laterality: N/A;   ESOPHAGOGASTRODUODENOSCOPY (EGD) WITH PROPOFOL N/A 07/28/2017   Procedure: ESOPHAGOGASTRODUODENOSCOPY (EGD) WITH PROPOFOL;  Surgeon: Lucilla Lame, MD;  Location: [place];  Service: Endoscopy;   Laterality: N/A;   SALPINGOOPHORECTOMY Bilateral    TOTAL HIP ARTHROPLASTY Left 09/2020    Social History   Tobacco Use   Smoking status: Never   Smokeless tobacco: Never   Tobacco comments:    smoking cessation materials not required  Vaping Use   Vaping Use: Never used  Substance Use Topics   Alcohol use: No    Alcohol/week: 0.0 standard drinks   Drug use: No     Medication list has been reviewed and updated.  Current Meds  Medication Sig   alendronate (FOSAMAX) 70 MG tablet TAKE 1 TABLET BY MOUTH ONCE A WEEK WITH A GLASS OF WATER ON AN EMPTY STOMACH AND REMAIN UPRIGHT WITHOUT EATING FOR 30 MINUTES   ARTIFICIAL TEARS 1 % ophthalmic solution Place 1 drop into both eyes daily as needed (Dry eye).    atorvastatin (LIPITOR) 10 MG tablet TAKE 1 TABLET(10 MG) BY MOUTH DAILY   Calcium Carbonate-Vit D-Min (CALCIUM 1200 PO) Take 1,200 mg by mouth daily.    Cholecalciferol (VITAMIN D3) 50 MCG (2000 UT) capsule Take 2,000 Units by mouth daily. am   conjugated estrogens (PREMARIN) vaginal cream Place vaginally 2 (two) times a week. Use 1-2 times per week. (Patient taking differently: Place 1 Applicatorful vaginally 2 (two) times a week. Place vaginally 2 (two) times a week. Use 1-2 times per week.)   CRANBERRY PO Take by mouth.   meloxicam (MOBIC) 15 MG tablet TAKE 1 TABLET(15 MG) BY MOUTH  DAILY AS NEEDED FOR PAIN   mometasone (ELOCON) 0.1 % cream Apply 1 application topically daily.   nitrofurantoin, macrocrystal-monohydrate, (MACROBID) 100 MG capsule Take 1 capsule (100 mg total) by mouth 2 (two) times daily for 7 days.   pantoprazole (PROTONIX) 40 MG tablet TAKE 1 TABLET(40 MG) BY MOUTH DAILY IN THE MORNING    [facility] 2/9 Scores 10/01/2021 05/15/2021 04/23/2021 09/11/2020  PHQ - 2 Score 0 0 0 0  PHQ- 9 Score 0 0 - 0  Exception Documentation - - - -    GAD 7 : Generalized Anxiety Score 10/01/2021 05/15/2021 09/11/2020 04/06/2020  Nervous, Anxious, on Edge 0 0 0 0  Control/stop worrying 0 0  0 0  Worry too much - different things 0 0 0 0  Trouble relaxing 0 0 0 0  Restless 0 0 0 0  Easily annoyed or irritable 0 0 0 0  Afraid - awful might happen 0 0 0 0  Total GAD 7 Score 0 0 0 0  Anxiety Difficulty Not difficult at all - - Not difficult at all    BP Readings from Last 3 Encounters:  10/01/21 127/80  05/15/21 132/70  05/02/21 (!) 166/69    Physical Exam Vitals and nursing note reviewed.  Constitutional:      General: She is not in acute distress.    Appearance: Normal appearance. She is well-developed.  HENT:     Head: Normocephalic and atraumatic.  Cardiovascular:     Rate and Rhythm: Normal rate and regular rhythm.     Pulses: Normal pulses.     Heart sounds: No murmur heard. Pulmonary:     Effort: Pulmonary effort is normal. No respiratory distress.     Breath sounds: No wheezing or rhonchi.  Abdominal:     General: Bowel sounds are normal. There is no distension.     Tenderness: There is no abdominal tenderness. There is no right CVA tenderness or left CVA tenderness.  Musculoskeletal:     Cervical back: Normal range of motion.  Lymphadenopathy:     Cervical: No cervical adenopathy.  Skin:    General: Skin is warm and dry.     Capillary Refill: Capillary refill takes less than 2 seconds.     Findings: No rash.  Neurological:     General: No focal deficit present.     Mental Status: She is alert and oriented to person, place, and time.  Psychiatric:        Mood and Affect: Mood normal.        Behavior: Behavior normal.    Wt Readings from Last 3 Encounters:  10/01/21 142 lb (64.4 kg)  05/15/21 138 lb (62.6 kg)  05/02/21 137 lb 8 oz (62.4 kg)    BP 127/80 Comment: patient reported   Pulse 77    Ht $R'5\' 6"'JG$  (1.676 m)    Wt 142 lb (64.4 kg)    SpO2 98%    BMI 22.92 kg/m   Assessment and Plan: 1. Dysuria Chronic daily sx suggestive of other process than bacterial infection. Will obtain culture. Recommend green pea sized amount of premarin vaginal  cream applied nightly to the urethral meatus. Short course of antibiotics for possible sx improvement. - Urine Culture - Ambulatory referral to Urology - nitrofurantoin, macrocrystal-monohydrate, (MACROBID) 100 MG capsule; Take 1 capsule (100 mg total) by mouth 2 (two) times daily for 7 days.  Dispense: 14 capsule; Refill: 0   Partially dictated using Editor, commissioning. Any errors are unintentional.  Halina Maidens, MD [facility]  10/01/2021

## 2021-10-04 LAB — URINE CULTURE

## 2021-10-08 ENCOUNTER — Telehealth: Payer: Self-pay | Admitting: Internal Medicine

## 2021-10-08 NOTE — Telephone Encounter (Signed)
Please advise for medication management. 

## 2021-10-08 NOTE — Telephone Encounter (Signed)
Copied from [place] [id number]. Topic: Quick Communication - Rx Refill/Question >> Oct 08, 2021  1:43 PM Pawlus, Brayton Layman A wrote: Pt stated she was recently seen and given nitrofurantoin, macrocrystal-monohydrate, (MACROBID) 100 MG capsule and does not think it is helping, pt wanted to know if something else could be called in.

## 2021-10-08 NOTE — Telephone Encounter (Signed)
Spoke to pt told her to complete antibiotics. Pt stated she feels better today but yesterday she didn't. Pt stated she has a appt with urology next Tuesday 10/16/21. Told pt if she needed [place] before then to give [place] a call. Pt verbalized understanding.  KP

## 2021-10-09 DIAGNOSIS — Z96642 Presence of left artificial hip joint: Secondary | ICD-10-CM | POA: Diagnosis not present

## 2021-10-15 ENCOUNTER — Other Ambulatory Visit: Payer: Self-pay | Admitting: Internal Medicine

## 2021-10-15 NOTE — Telephone Encounter (Signed)
Requested medication (s) are due for refill today:   Yes  Requested medication (s) are on the active medication list:   Yes  Future visit scheduled:   Yes   Last ordered: 10/16/2020 #4, 12 refills  Returned because failed protocol for labs within 360 days.   Requested Prescriptions  Pending Prescriptions Disp Refills   alendronate (FOSAMAX) 70 MG tablet [Pharmacy Med Name: ALENDRONATE 70MG  TABLETS] 4 tablet 12    Sig: TAKE 1 TABLET BY MOUTH ONCE A WEEK ON AN EMPTY STOMACH WITH A GLASS OF WATER; REMAIN UPRIGHT WITHOUT EATING FOR 45 MINUTES     Endocrinology:  Bisphosphonates Failed - 10/15/2021  3:39 AM      Failed - Vitamin D in normal range and within 360 days    25-Hydroxy, Vitamin D-3  Date Value Ref Range Status  08/04/2019 39 ng/mL Final    Comment:    This test was developed and its performance characteristics determined by [facility]. It has not been cleared or approved by the Food and Drug Administration.    25-Hydroxy, Vitamin D-2  Date Value Ref Range Status  08/04/2019 <1.0 ng/mL Final    Comment:    This test was developed and its performance characteristics determined by [facility]. It has not been cleared or approved by the Food and Drug Administration.    25-Hydroxy, Vitamin D  Date Value Ref Range Status  08/04/2019 39 ng/mL Final    Comment:    Reference Range: All Ages: Target levels 30 - 100    Vit D, 25-Hydroxy  Date Value Ref Range Status  09/11/2020 50.0 30.0 - 100.0 ng/mL Final    Comment:    Vitamin D deficiency has been defined by the [place] practice guideline as a level of serum 25-OH vitamin D less than 20 ng/mL (1,2). The Endocrine Society went on to further define vitamin D insufficiency as a level between 21 and 29 ng/mL (2). 1. IOM (Institute of Medicine). 2010. Dietary reference    intakes for calcium and D. [place]: The    [employer]. 2. Holick MF, Binkley Earlimart, Bischoff-Ferrari  HA, et al.    Evaluation, treatment, and prevention of vitamin D    deficiency: an Endocrine Society clinical practice    guideline. JCEM. 2011 Jul; 96(7):1911-30.           Passed - Ca in normal range and within 360 days    Calcium  Date Value Ref Range Status  05/15/2021 10.3 8.7 - 10.3 mg/dL Final          Passed - Valid encounter within last 12 months    Recent Outpatient Visits           2 weeks ago [facility] [name], MD   5 months ago Annual physical exam   [facility] [name], MD   1 year ago Hyperlipidemia, mild   [facility] [name], MD   1 year ago Elevated BP without diagnosis of hypertension   [facility] [name], MD   1 year ago Annual physical exam   [facility] [name], MD       Future Appointments             Tomorrow Billey Co, MD [place]   In 7 months [name], MD [facility], [facility]

## 2021-10-16 ENCOUNTER — Encounter: Payer: Self-pay | Admitting: Urology

## 2021-10-16 ENCOUNTER — Other Ambulatory Visit: Payer: Self-pay | Admitting: *Deleted

## 2021-10-16 ENCOUNTER — Other Ambulatory Visit
Admission: RE | Admit: 2021-10-16 | Discharge: 2021-10-16 | Disposition: A | Discharge status: Home / Self Care | Payer: Medicare Other | Attending: Urology | Admitting: Urology

## 2021-10-16 ENCOUNTER — Ambulatory Visit: Payer: Medicare Other | Admitting: Urology

## 2021-10-16 ENCOUNTER — Other Ambulatory Visit: Payer: Self-pay

## 2021-10-16 VITALS — BP 166/94 | HR 82 | Ht 66.0 in | Wt 142.0 lb

## 2021-10-16 DIAGNOSIS — N390 Urinary tract infection, site not specified: Secondary | ICD-10-CM | POA: Diagnosis not present

## 2021-10-16 DIAGNOSIS — R300 Dysuria: Secondary | ICD-10-CM

## 2021-10-16 DIAGNOSIS — R399 Unspecified symptoms and signs involving the genitourinary system: Secondary | ICD-10-CM | POA: Diagnosis not present

## 2021-10-16 LAB — URINALYSIS, COMPLETE (UACMP) WITH MICROSCOPIC
Bilirubin Urine: NEGATIVE
Glucose, UA: NEGATIVE mg/dL
Ketones, ur: NEGATIVE mg/dL
Leukocytes,Ua: NEGATIVE
Nitrite: NEGATIVE
Protein, ur: NEGATIVE mg/dL
Specific Gravity, Urine: <1.005 — ABNORMAL LOW (ref 1.005–1.030)
pH: 5.5 (ref 5.0–8.0)

## 2021-10-16 NOTE — Progress Notes (Signed)
10/16/21 9:28 AM   Valerie Raymond [birth year] [id number]  CC: Dysuria, recent UTI, urgency  HPI: 71 year old female who reports at least 1 year of intermittent dysuria and some urgency during the day.  This happens at least once a day, and seems to be worse when she is really busy and stressed at work.  She also was recently diagnosed with a E. coli UTI on 10/01/2021 and treated with 7 days of Macrobid, which has improved her symptoms.  She denies any smoking history or gross hematuria.  She was recently started on topical estrogen cream by her PCP.  Urinalysis today 6-10 squamous cells, 0-5 WBCs, 6-10 RBCs, few bacteria.  She drinks primarily water during the day, decaf coffee and tea in the morning.   PMH: Past Medical History:  Diagnosis Date   Arthralgia of hip 07/23/2015   Arthritis    knee,hip,hands   Degenerative disc disease, lumbar    DJD (degenerative joint disease)    Dysphagia    with solids and regurgitation   Endometriosis    h/o   GERD (gastroesophageal reflux disease)    occasional   Heartburn    Joint pain    Osteoporosis    hip   Pelvic pressure in female 01/02/2016   Rash    skin rash and itching   Vitamin D deficiency     Surgical History: Past Surgical History:  Procedure Laterality Date   ABDOMINAL HYSTERECTOMY  09/16/1990   COLONOSCOPY  09/16/2004   COLONOSCOPY N/A 01/23/2015   Procedure: COLONOSCOPY;  Surgeon: Lucilla Lame, MD;  Location: [place];  Service: Gastroenterology;  Laterality: N/A;  cecum- [phone]   ESOPHAGEAL DILATION  04/21/2017   Procedure: ESOPHAGEAL DILATION;  Surgeon: Lucilla Lame, MD;  Location: [place];  Service: Endoscopy;;   ESOPHAGEAL DILATION  07/07/2017   Procedure: ESOPHAGEAL DILATION;  Surgeon: Lucilla Lame, MD;  Location: [place];  Service: Endoscopy;;   ESOPHAGEAL DILATION N/A 07/28/2017   Procedure: ESOPHAGEAL DILATION;  Surgeon: Lucilla Lame, MD;  Location: [place];   Service: Endoscopy;  Laterality: N/A;   ESOPHAGOGASTRODUODENOSCOPY N/A 04/21/2017   Procedure: ESOPHAGOGASTRODUODENOSCOPY (EGD);  Surgeon: Lucilla Lame, MD;  Location: [place];  Service: Endoscopy;  Laterality: N/A;   ESOPHAGOGASTRODUODENOSCOPY (EGD) WITH PROPOFOL N/A 07/07/2017   Procedure: ESOPHAGOGASTRODUODENOSCOPY (EGD) WITH PROPOFOL;  Surgeon: Lucilla Lame, MD;  Location: [place];  Service: Endoscopy;  Laterality: N/A;   ESOPHAGOGASTRODUODENOSCOPY (EGD) WITH PROPOFOL N/A 07/28/2017   Procedure: ESOPHAGOGASTRODUODENOSCOPY (EGD) WITH PROPOFOL;  Surgeon: Lucilla Lame, MD;  Location: [place];  Service: Endoscopy;  Laterality: N/A;   SALPINGOOPHORECTOMY Bilateral    TOTAL HIP ARTHROPLASTY Left 09/2020     Family History: Family History  Problem Relation Age of Onset   Pancreatic cancer Mother    Breast cancer Sister 59   Prostate cancer Father    Breast cancer Maternal Aunt    Breast cancer Cousin        pat cousin   Breast cancer Sister 68   Colon cancer Paternal Uncle    Prostate cancer Brother    Ovarian cancer Neg Hx    Diabetes Neg Hx     Social History:  reports that she has never smoked. She has never used smokeless tobacco. She reports that she does not drink alcohol and does not use drugs.  Physical Exam: BP (!) 166/94    Pulse 82    Ht 5\' 6"  (1.676 m)    Wt 142  lb (64.4 kg)    BMI 22.92 kg/m    Constitutional:  Alert and oriented, No acute distress. Cardiovascular: No clubbing, cyanosis, or edema. Respiratory: Normal respiratory effort, no increased work of breathing. GI: Abdomen is soft, nontender, nondistended, no abdominal masses   Laboratory Data: Reviewed, see HPI  Pertinent Imaging: None to review  Assessment & Plan:   71 year old female with at least a year of intermittent dysuria and urgency of unclear etiology, as well as recent UTI treated with antibiotics.  Persistent microscopic hematuria on UA today.  We  discussed possible etiologies including GSM or interstitial cystitis, pelvic floor dysfunction, CIS/bladder tumor, or other less likely rare etiologies.  With her microscopic hematuria today and long history of dysuria, I recommended cystoscopy to rule out any other obvious etiologies.  We discussed the complexities of pelvic pain and possible range of etiologies including pelvic floor dysfunction, chronic bladder pain syndrome, and interstitial cystitis.  We reviewed the AUA guidelines that recommend an algorithmic approach to treatment for these patients, and that a trial of different medications and strategies is sometimes needed to find the approach that works best for each patient's unique situation.  I reinforced the importance of stress management, relaxation, avoiding triggers, and pain management in the approach to pelvic pain.  Continue topical estrogen cream Extensive patient information provided regarding interstitial cystitis RTC for cystoscopy Consider referral to pelvic floor physical therapy or trial of low-dose amitriptyline in the future  Nickolas Madrid, MD 10/16/2021  [facility] [address], [place], [place] [address] [phone]

## 2021-10-16 NOTE — Patient Instructions (Signed)
Cystoscopy Cystoscopy is a procedure that is used to help diagnose and sometimes treat conditions that affect the lower urinary tract. The lower urinary tract includes the bladder and the urethra. The urethra is the tube that drains urine from the bladder. Cystoscopy is done using a thin, tube-shaped instrument with a light and camera at the end (cystoscope). The cystoscope may be hard or flexible, depending on the goal of the procedure. The cystoscope is inserted through the urethra, into the bladder. Cystoscopy may be recommended if you have: Urinary tract infections that keep coming back. Blood in the urine (hematuria). An inability to control when you urinate (urinary incontinence) or an overactive bladder. Unusual cells found in a urine sample. A blockage in the urethra, such as a urinary stone. Painful urination. An abnormality in the bladder found during an intravenous pyelogram (IVP) or CT scan. What are the risks? Generally, this is a safe procedure. However, problems may occur, including: Infection. Bleeding.  What happens during the procedure?  You will be given one or more of the following: A medicine to numb the area (local anesthetic). The area around the opening of your urethra will be cleaned. The cystoscope will be passed through your urethra into your bladder. Germ-free (sterile) fluid will flow through the cystoscope to fill your bladder. The fluid will stretch your bladder so that your health care provider can clearly examine your bladder walls. Your doctor will look at the urethra and bladder. The cystoscope will be removed The procedure may vary among health care providers  What can I expect after the procedure? After the procedure, it is common to have: Some soreness or pain in your urethra. Urinary symptoms. These include: Mild pain or burning when you urinate. Pain should stop within a few minutes after you urinate. This may last for up to a few days after the  procedure. A small amount of blood in your urine for several days. Feeling like you need to urinate but producing only a small amount of urine. Follow these instructions at home: General instructions Return to your normal activities as told by your health care provider.  Drink plenty of fluids after the procedure. Keep all follow-up visits as told by your health care provider. This is important. Contact a health care provider if you: Have pain that gets worse or does not get better with medicine, especially pain when you urinate lasting longer than 72 hours after the procedure. Have trouble urinating. Get help right away if you: Have blood clots in your urine. Have a fever or chills. Are unable to urinate. Summary Cystoscopy is a procedure that is used to help diagnose and sometimes treat conditions that affect the lower urinary tract. Cystoscopy is done using a thin, tube-shaped instrument with a light and camera at the end. After the procedure, it is common to have some soreness or pain in your urethra. It is normal to have blood in your urine after the procedure.  If you were prescribed an antibiotic medicine, take it as told by your health care provider.  This information is not intended to replace advice given to you by your health care provider. Make sure you discuss any questions you have with your health care provider. Document Revised: 08/25/2018 Document Reviewed: 08/25/2018 Elsevier Patient Education  [place].   Interstitial Cystitis Interstitial cystitis is inflammation of the bladder. This condition is also known as painful bladder syndrome. This may cause pain in the bladder area as well as a frequent  and urgent need to urinate. The bladder is an organ that stores urine after the urine is made in the kidneys. The severity of interstitial cystitis can vary from person to person. You may have flare-ups, and then your symptoms may go away for a while. For many  people, it becomes a long-term (chronic) problem. What are the causes? The cause of this condition is not known. What increases the risk? The following factors may make you more likely to develop this condition: Being female. Having fibromyalgia. Having irritable bowel syndrome (IBS). Having endometriosis. Having chronic fatigue syndrome. This condition may be aggravated by: Stress. Smoking. Spicy foods. What are the signs or symptoms? Symptoms of interstitial cystitis vary, and they can change over time. Symptoms may include: Discomfort or pain in the bladder area, which is in the lower abdomen. Pain can range from mild to severe. The pain may change in intensity as the bladder fills with urine or as it empties. Pain in the pelvic area, between the hip bones. A constant urge to urinate. Frequent urination. Pain during urination. Pain during sex. Blood in the urine. Feeling tired (fatigue). For women, symptoms often get worse during menstruation. How is this diagnosed? This condition is diagnosed based on your symptoms, your medical history, and a physical exam. Your health care provider may need to rule out other conditions and may order other tests, such as: Urine tests. Cystoscopy. For this test, a tool similar to a very thin telescope is used to look into your bladder. Biopsy. This involves taking a sample of tissue from the bladder to be examined under a microscope. How is this treated? There is no cure for this condition, but treatment can help you control your symptoms. Work closely with your health care provider to find the most effective treatments for you. Treatment options may include: Medicines to relieve pain and reduce how often you feel the need to urinate. This treatment may include: A procedure where a small amount of medicine that eases irritation is put inside your bladder through a catheter (bladder instillation). Lifestyle changes, such as changing your diet or  taking steps to control stress. Physical therapy. This may include: Exercises to help relax the pelvic floor muscles. Massage to relax tight muscles (myofascial release). Learning ways to control when you urinate (bladder training). Using a device that provides electrical stimulation to your nerves, which can relieve pain (neuromodulation therapy). The device is placed on your back, where it blocks the nerves that cause you to feel pain in your bladder area. A procedure that stretches your bladder by filling it with air or fluid (hydrodistention). Surgery. This is rare. It is only done for extreme cases, if other treatments do not help. Follow these instructions at home: Lifestyle Learn and practice relaxation techniques, such as deep breathing and muscle relaxation. Get care for your body and mental well-being, such as: Cognitive behavioral therapy (CBT). This therapy changes the way you think or act in response to different situations. This may improve how you feel. Seeing a mental health therapist to evaluate and treat depression, if necessary. Work with your health care provider on other ways to manage pain. Acupuncture may be helpful. Avoid drinking alcohol. Do not use any products that contain nicotine or tobacco. These products include cigarettes, chewing tobacco, and vaping devices, such as e-cigarettes. If you need help quitting, ask your health care provider. Eating and drinking Make dietary changes as recommended by your health care provider. You may need to avoid: Spicy  foods. Foods that contain a lot of potassium. Limit your intake of drinks that increase your urge to urinate. These include alcohol and caffeinated drinks like soda, coffee, and tea. Bladder training  Use bladder training techniques as directed. Techniques may include: Urinating at scheduled times. Training yourself to delay urination. Keep a bladder diary. Write down the times you urinate and any symptoms that  you have. This can help you find out which foods, liquids, or activities make your symptoms worse. Use your bladder diary to schedule bathroom trips. If you are away from home, plan to be near a bathroom at each of your scheduled times. Make sure that you urinate just before you leave the house and just before you go to bed. General instructions Take over-the-counter and prescription medicines only as told by your health care provider. Try a warm or cool compress over your bladder for comfort. Avoid wearing tight clothing. Do exercises to relax your pelvic floor muscles as told by your physical therapist. Keep all follow-up visits. This is important. Where to find more information To find more information or a support group near you, visit: [facility] [facility]: urologyhealth.org Interstitial Cystitis Association: ClassPreviews.com.br Contact a health care provider if you have: Symptoms that do not get better with treatment. Pain or discomfort that gets worse. More frequent urges to urinate. A fever. Get help right away if: You have no control over when you urinate. Summary Interstitial cystitis is inflammation of the bladder. This condition may cause pain in the bladder area as well as a frequent and urgent need to urinate. You may have flare-ups of the condition, and then it may go away for a while. For many people, it becomes a long-term (chronic) problem. There is no cure for interstitial cystitis, but treatment methods are available to control your symptoms. This information is not intended to replace advice given to you by your health care provider. Make sure you discuss any questions you have with your health care provider. Document Revised: 04/07/2020 Document Reviewed: 04/07/2020 Elsevier Patient Education  [place] for Interstitial Cystitis Interstitial cystitis (IC) is a long-term (chronic) condition that causes pain and pressure in the bladder, the lower  abdomen, and the pelvic area. Other symptoms of IC include urinary urgency and frequency. Symptoms tend to come and go. Many people with IC find that certain foods trigger their symptoms. Different foods may be problematic for different people. Some foods are more likely to cause symptoms than others. Learning which foods bother you and which do not can help you come up with an eating plan to manage IC. What are tips for following this plan? You may find it helpful to work with a dietitian. This health care provider can help you develop your eating plan by doing an elimination diet, which involves these steps: Start with a list of foods that you think trigger your IC symptoms along with the foods that most commonly trigger symptoms for many people with IC. Eliminate those foods from your diet for about one month, then start reintroducing the foods one at a time to see which ones trigger your symptoms. Make a list of the foods that trigger your symptoms. It may take several months to find out which foods bother you. Reading food labels Once you know which foods trigger your IC symptoms, you can avoid them. However, it is also a good idea to read food labels because some foods that trigger your symptoms may be included as ingredients  in other foods. These ingredients may include: Chili peppers. Tomato products. Soy. Worcestershire sauce. Vinegar. Alcohol. Citrus flavors or juices. Artificial sweeteners. Monosodium glutamate. Shopping Shopping can be a challenge if many foods trigger your IC. When you go grocery shopping, bring a list of the foods you can eat. You can get an app for your phone that lets you know which foods are the safest and which you may want to avoid. You can find the app at the Interstitial Cystitis Network website: www.ic-network.com Meal planning Plan your meals according to the results of your elimination diet. If you have not done an elimination diet, plan meals according  to IC food lists recommended by your health care provider or dietitian. These lists tell you which foods are least and most likely to cause symptoms. Avoid certain types of food when you go out to eat, such as pizza and foods typically served at [place], [place], and [place]. These foods often contain ingredients that can aggravate IC. General information Here are some general guidelines for an IC eating plan: Do not eat large portions. Drink plenty of fluids with your meals. Do not eat foods that are high in sugar, salt, or saturated fat. Choose whole fruits instead of juice. Eat a colorful variety of vegetables. What foods should I eat? For people with IC, the best diet is a balanced one that includes things from all the food groups. Even if you have to avoid certain foods, there are still plenty of healthy choices in each group. The following are some foods that are least bothersome and may be safest to eat: Fruits Bananas. Blueberries and blueberry juice. Melons. Pears. Apples. Dates. Prunes. Raisins. Apricots. Vegetables Asparagus. Avocado. Celery. Beets. Bell peppers. Black olives. Broccoli. Brussels sprouts. Cabbage. Carrots. Cauliflower. Cucumber. Eggplant. Green beans. Potatoes. Radishes. Spinach. Squash. Turnips. Zucchini. Mushrooms. Peas. Grains Oats. Rice. Bran. Oatmeal. Whole wheat bread. Meats and other proteins Beef. Fish and other seafood. Eggs. Nuts. Peanut butter. Pork. Poultry. Lamb. Garbanzo beans. Pinto beans. Dairy Whole or low-fat milk. American, mozzarella, mild cheddar, feta, ricotta, and cream cheeses. The items listed above may not be a complete list of foods and beverages you can eat. Contact a dietitian for more information. What foods should I avoid? You should avoid any foods that seem to trigger your symptoms. It is also a good idea to avoid foods that are most likely to cause symptoms in many people with IC. These include the following: Fruits Citrus  fruits, including lemons, limes, oranges, and grapefruit. Cranberries. Strawberries. Pineapple. Kiwi. Vegetables Chili peppers. Onions. Sauerkraut. Tomato and tomato products. Angie Fava. Grains You do not need to avoid any type of grain unless it triggers your symptoms. Meats and other proteins Precooked or cured meats, such as sausages or meat loaves. Soy products. Dairy Chocolate ice cream. Processed cheese. Yogurt. Beverages Alcohol. Chocolate drinks. Coffee. Cranberry juice. Carbonated drinks. Tea (black, green, or herbal). Tomato juice. Sports drinks. The items listed above may not be a complete list of foods and beverages you should avoid. Contact a dietitian for more information. Summary Many people with IC find that certain foods trigger their symptoms. Different foods may be problematic for different people. Some foods are more likely to cause symptoms than others. You may find it helpful to work with a dietitian to do an elimination diet and come up with an eating plan that is right for you. Plan your meals according to the results of your elimination diet. If you have not done an  elimination diet, plan your meals using IC food lists. These lists tell you which foods are least and most likely to cause symptoms. The best diet for people with IC is a balanced diet that includes foods from all the food groups. Even if you have to avoid certain foods, there are still plenty of healthy choices in each group. This information is not intended to replace advice given to you by your health care provider. Make sure you discuss any questions you have with your health care provider. Document Revised: 12/24/2018 Document Reviewed: 05/07/2018 Elsevier Patient Education  2022 [employer].

## 2021-11-01 ENCOUNTER — Ambulatory Visit: Payer: Medicare Other | Admitting: Urology

## 2021-11-01 ENCOUNTER — Encounter: Payer: Self-pay | Admitting: Urology

## 2021-11-01 ENCOUNTER — Other Ambulatory Visit: Payer: Self-pay

## 2021-11-01 VITALS — BP 153/98 | HR 78 | Ht 66.0 in | Wt 137.0 lb

## 2021-11-01 DIAGNOSIS — R3915 Urgency of urination: Secondary | ICD-10-CM | POA: Diagnosis not present

## 2021-11-01 DIAGNOSIS — R300 Dysuria: Secondary | ICD-10-CM

## 2021-11-01 NOTE — Progress Notes (Signed)
Cystoscopy Procedure Note:  Indication: Dysuria/urgency  After informed consent and discussion of the procedure and its risks, Valerie Raymond was positioned and prepped in the standard fashion. Cystoscopy was performed with a flexible cystoscope. The urethra, bladder neck and entire bladder was visualized in a standard fashion.  Mild cystitis cystica, but no suspicious lesions.  The ureteral orifices were visualized in their normal location and orientation.  No abnormalities on retroflexion  Findings: Normal cystoscopy  Assessment and Plan: 71 year old female with intermittent dysuria and urgency that seems to be exacerbated by stress.  At her last visit we discussed interstitial cystitis and eating plan, and by avoiding those foods her symptoms have significantly improved.  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  Continue Premarin cream RTC 6 months symptom check  Nickolas Madrid, MD 11/01/2021

## 2021-11-13 DIAGNOSIS — H2513 Age-related nuclear cataract, bilateral: Secondary | ICD-10-CM | POA: Diagnosis not present

## 2021-11-14 DIAGNOSIS — H2511 Age-related nuclear cataract, right eye: Secondary | ICD-10-CM | POA: Diagnosis not present

## 2021-11-23 ENCOUNTER — Encounter: Payer: Self-pay | Admitting: Ophthalmology

## 2021-11-23 ENCOUNTER — Other Ambulatory Visit: Payer: Self-pay

## 2021-11-28 NOTE — Discharge Instructions (Signed)

## 2021-12-03 ENCOUNTER — Ambulatory Visit: Payer: Medicare Other | Admitting: Anesthesiology

## 2021-12-03 ENCOUNTER — Ambulatory Visit
Admission: RE | Admit: 2021-12-03 | Discharge: 2021-12-03 | Disposition: A | Discharge status: Home / Self Care | Payer: Medicare Other | Attending: Ophthalmology | Admitting: Ophthalmology

## 2021-12-03 ENCOUNTER — Encounter: Payer: Self-pay | Admitting: Ophthalmology

## 2021-12-03 ENCOUNTER — Encounter
Admission: RE | Disposition: A | Discharge status: Home / Self Care | Payer: Self-pay | Source: Home / Self Care | Attending: Ophthalmology

## 2021-12-03 ENCOUNTER — Other Ambulatory Visit: Payer: Self-pay

## 2021-12-03 DIAGNOSIS — H25811 Combined forms of age-related cataract, right eye: Secondary | ICD-10-CM | POA: Diagnosis not present

## 2021-12-03 DIAGNOSIS — Z79899 Other long term (current) drug therapy: Secondary | ICD-10-CM | POA: Insufficient documentation

## 2021-12-03 DIAGNOSIS — H2511 Age-related nuclear cataract, right eye: Secondary | ICD-10-CM | POA: Diagnosis not present

## 2021-12-03 DIAGNOSIS — K219 Gastro-esophageal reflux disease without esophagitis: Secondary | ICD-10-CM | POA: Diagnosis not present

## 2021-12-03 HISTORY — PX: CATARACT EXTRACTION W/PHACO: SHX586

## 2021-12-03 SURGERY — PHACOEMULSIFICATION, CATARACT, WITH IOL INSERTION
Anesthesia: Monitor Anesthesia Care | Site: Eye | Laterality: Right

## 2021-12-03 MED ORDER — LIDOCAINE HCL (PF) 2 % IJ SOLN
INTRAOCULAR | Status: DC | PRN
  Administered 2021-12-03: 1 mL via INTRAOCULAR

## 2021-12-03 MED ORDER — SIGHTPATH DOSE#1 SODIUM HYALURONATE 10 MG/ML IO SOLUTION
PREFILLED_SYRINGE | INTRAOCULAR | Status: DC | PRN
  Administered 2021-12-03: 0.85 mL via INTRAOCULAR

## 2021-12-03 MED ORDER — MIDAZOLAM HCL 2 MG/2ML IJ SOLN
INTRAMUSCULAR | Status: DC | PRN
  Administered 2021-12-03: 2 mg via INTRAVENOUS

## 2021-12-03 MED ORDER — TETRACAINE HCL 0.5 % OP SOLN
1.0000 [drp] | OPHTHALMIC | Status: DC | PRN
  Administered 2021-12-03 (×3): 1 [drp] via OPHTHALMIC

## 2021-12-03 MED ORDER — FENTANYL CITRATE (PF) 100 MCG/2ML IJ SOLN
INTRAMUSCULAR | Status: DC | PRN
  Administered 2021-12-03: 50 ug via INTRAVENOUS

## 2021-12-03 MED ORDER — SIGHTPATH DOSE#1 BSS IO SOLN
INTRAOCULAR | Status: DC | PRN
  Administered 2021-12-03: 15 mL

## 2021-12-03 MED ORDER — SIGHTPATH DOSE#1 SODIUM HYALURONATE 23 MG/ML IO SOLUTION
PREFILLED_SYRINGE | INTRAOCULAR | Status: DC | PRN
  Administered 2021-12-03: 0.6 mL via INTRAOCULAR

## 2021-12-03 MED ORDER — LACTATED RINGERS IV SOLN: INTRAVENOUS | Status: DC

## 2021-12-03 MED ORDER — MOXIFLOXACIN HCL 0.5 % OP SOLN
OPHTHALMIC | Status: DC | PRN
  Administered 2021-12-03: 0.2 mL via OPHTHALMIC

## 2021-12-03 MED ORDER — SIGHTPATH DOSE#1 BSS IO SOLN
INTRAOCULAR | Status: DC | PRN
  Administered 2021-12-03: 61 mL via OPHTHALMIC

## 2021-12-03 MED ORDER — ARMC OPHTHALMIC DILATING DROPS
1.0000 "application " | OPHTHALMIC | Status: DC | PRN
  Administered 2021-12-03 (×3): 1 via OPHTHALMIC

## 2021-12-03 SURGICAL SUPPLY — 14 items
CATARACT SUITE SIGHTPATH (MISCELLANEOUS) ×2 IMPLANT
DISSECTOR HYDRO NUCLEUS 50X22 (MISCELLANEOUS) ×2 IMPLANT
FEE CATARACT SUITE SIGHTPATH (MISCELLANEOUS) ×1 IMPLANT
GLOVE SURG GAMMEX PI TX LF 7.5 (GLOVE) ×2 IMPLANT
GLOVE SURG SYN 8.5  E (GLOVE) ×1
GLOVE SURG SYN 8.5 E (GLOVE) ×1 IMPLANT
GLOVE SURG SYN 8.5 PF PI (GLOVE) ×1 IMPLANT
LENS IOL TECNIS EYHANCE 26.0 (Intraocular Lens) ×1 IMPLANT
NDL FILTER BLUNT 18X1 1/2 (NEEDLE) ×1 IMPLANT
NEEDLE FILTER BLUNT 18X 1/2SAF (NEEDLE) ×1
NEEDLE FILTER BLUNT 18X1 1/2 (NEEDLE) ×1 IMPLANT
SYR 3ML LL SCALE MARK (SYRINGE) ×2 IMPLANT
SYR 5ML LL (SYRINGE) ×2 IMPLANT
WATER STERILE IRR 250ML POUR (IV SOLUTION) ×2 IMPLANT

## 2021-12-03 NOTE — Anesthesia Preprocedure Evaluation (Signed)
Anesthesia Evaluation  ?Patient identified by MRN, date of birth, ID band ? ?History of Anesthesia Complications ?Negative for: history of anesthetic complications ? ?Airway ?Mallampati: II ? ?TM Distance: >3 FB ?Neck ROM: Full ? ? ? Dental ?no notable dental hx. ? ?  ?Pulmonary ?neg pulmonary ROS,  ?  ?Pulmonary exam normal ? ? ? ? ? ? ? Cardiovascular ?Exercise Tolerance: Good ?negative cardio ROS ?Normal cardiovascular exam ? ? ?  ?Neuro/Psych ?negative neurological ROS ?   ? GI/Hepatic ?Neg liver ROS, GERD  Medicated and Controlled,  ?Endo/Other  ?negative endocrine ROS ? Renal/GU ?negative Renal ROS  ? ?  ?Musculoskeletal ? ? Abdominal ?  ?Peds ? Hematology ?negative hematology ROS ?(+)   ?Anesthesia Other Findings ? ? Reproductive/Obstetrics ? ?  ? ? ? ? ? ? ? ? ? ? ? ? ? ?  ?  ? ? ? ? ? ? ? ? ?Anesthesia Physical ?Anesthesia Plan ? ?ASA: 2 ? ?Anesthesia Plan: MAC  ? ?Post-op Pain Management:   ? ?Induction: Intravenous ? ?PONV Risk Score and Plan: 2 and TIVA, Midazolam and Treatment may vary due to age or medical condition ? ?Airway Management Planned: Nasal Cannula and Natural Airway ? ?Additional Equipment: None ? ?Intra-op Plan:  ? ?Post-operative Plan:  ? ?Informed Consent: I have reviewed the patients History and Physical, chart, labs and discussed the procedure including the risks, benefits and alternatives for the proposed anesthesia with the patient or authorized representative who has indicated his/her understanding and acceptance.  ? ? ? ? ? ?Plan Discussed with: CRNA ? ?Anesthesia Plan Comments:   ? ? ? ? ? ? ?Anesthesia Quick Evaluation ? ?

## 2021-12-03 NOTE — Anesthesia Postprocedure Evaluation (Signed)
Anesthesia Post Note ? ?Patient: Valerie Raymond ? ?Procedure(s) Performed: CATARACT EXTRACTION PHACO AND INTRAOCULAR LENS PLACEMENT (IOC) RIGHT 4.01 00:33.0 (Right: Eye) ? ? ?  ?Patient location during evaluation: PACU ?Anesthesia Type: MAC ?Level of consciousness: awake and alert ?Pain management: pain level controlled ?Vital Signs Assessment: post-procedure vital signs reviewed and stable ?Respiratory status: spontaneous breathing ?Cardiovascular status: blood pressure returned to baseline ?Postop Assessment: no apparent nausea or vomiting, adequate PO intake and no headache ?Anesthetic complications: no ? ? ?No notable events documented. ? ?Adele Barthel Alva Broxson ? ? ? ? ? ?

## 2021-12-03 NOTE — H&P (Signed)
[place]  ? ?Primary Care Physician:  [name], MD ?Ophthalmologist: Dr. Benay Pillow ? ?Pre-Procedure History & Physical: ?HPI:  Valerie Raymond is a 71 y.o. female here for cataract surgery. ?  ?Past Medical History:  ?Diagnosis Date  ? Arthralgia of hip 07/23/2015  ? Arthritis   ? knee,hip,hands  ? Degenerative disc disease, lumbar   ? DJD (degenerative joint disease)   ? Dysphagia   ? with solids and regurgitation  ? Endometriosis   ? h/o  ? GERD (gastroesophageal reflux disease)   ? occasional  ? Heartburn   ? Joint pain   ? Osteoporosis   ? hip  ? Pelvic pressure in female 01/02/2016  ? Rash   ? skin rash and itching  ? Vitamin D deficiency   ? ? ?Past Surgical History:  ?Procedure Laterality Date  ? ABDOMINAL HYSTERECTOMY  09/16/1990  ? COLONOSCOPY  09/16/2004  ? COLONOSCOPY N/A 01/23/2015  ? Procedure: COLONOSCOPY;  Surgeon: Lucilla Lame, MD;  Location: [place];  Service: Gastroenterology;  Laterality: N/A;  cecum- [id number]  ? ESOPHAGEAL DILATION  04/21/2017  ? Procedure: ESOPHAGEAL DILATION;  Surgeon: Lucilla Lame, MD;  Location: [place];  Service: Endoscopy;;  ? ESOPHAGEAL DILATION  07/07/2017  ? Procedure: ESOPHAGEAL DILATION;  Surgeon: Lucilla Lame, MD;  Location: [place];  Service: Endoscopy;;  ? ESOPHAGEAL DILATION N/A 07/28/2017  ? Procedure: ESOPHAGEAL DILATION;  Surgeon: Lucilla Lame, MD;  Location: [place];  Service: Endoscopy;  Laterality: N/A;  ? ESOPHAGOGASTRODUODENOSCOPY N/A 04/21/2017  ? Procedure: ESOPHAGOGASTRODUODENOSCOPY (EGD);  Surgeon: Lucilla Lame, MD;  Location: [place];  Service: Endoscopy;  Laterality: N/A;  ? ESOPHAGOGASTRODUODENOSCOPY (EGD) WITH PROPOFOL N/A 07/07/2017  ? Procedure: ESOPHAGOGASTRODUODENOSCOPY (EGD) WITH PROPOFOL;  Surgeon: Lucilla Lame, MD;  Location: [place];  Service: Endoscopy;  Laterality: N/A;  ? ESOPHAGOGASTRODUODENOSCOPY (EGD) WITH PROPOFOL N/A 07/28/2017  ? Procedure:  ESOPHAGOGASTRODUODENOSCOPY (EGD) WITH PROPOFOL;  Surgeon: Lucilla Lame, MD;  Location: [place];  Service: Endoscopy;  Laterality: N/A;  ? SALPINGOOPHORECTOMY Bilateral   ? TOTAL HIP ARTHROPLASTY Left 09/2020  ? ? ?Prior to Admission medications   ?Medication Sig Start Date End Date Taking? Authorizing Provider  ?alendronate (FOSAMAX) 70 MG tablet TAKE 1 TABLET BY MOUTH ONCE A WEEK ON AN EMPTY STOMACH WITH A GLASS OF WATER; REMAIN UPRIGHT WITHOUT EATING FOR 30 MINUTES 10/15/21  Yes [name], MD  ?ARTIFICIAL TEARS 1 % ophthalmic solution Place 1 drop into both eyes daily as needed (Dry eye).  08/10/19  Yes [provider]  ?atorvastatin (LIPITOR) 10 MG tablet TAKE 1 TABLET(10 MG) BY MOUTH DAILY 09/26/21  Yes [name], MD  ?Calcium Carbonate-Vit D-Min (CALCIUM 1200 PO) Take 1,200 mg by mouth daily.    Yes [provider]  ?Cholecalciferol (VITAMIN D3) 50 MCG (2000 UT) capsule Take 2,000 Units by mouth daily. am   Yes [provider]  ?conjugated estrogens (PREMARIN) vaginal cream PLACE VAGINALLY TWO TIMES A WEEK 10/02/21  Yes Rubie Maid, MD  ?CRANBERRY PO Take by mouth.   Yes [provider]  ?meloxicam (MOBIC) 15 MG tablet TAKE 1 TABLET(15 MG) BY MOUTH DAILY AS NEEDED FOR PAIN 09/04/21  Yes [name], MD  ?mometasone (ELOCON) 0.1 % cream Apply 1 application topically daily.   Yes [provider]  ?pantoprazole (PROTONIX) 40 MG tablet TAKE 1 TABLET(40 MG) BY MOUTH DAILY IN THE MORNING 08/13/21  Yes [name], MD  ? ? ?  Allergies as of 11/14/2021 - Review Complete 11/01/2021  ?Allergen Reaction Noted  ? Lodine [etodolac] Diarrhea and Nausea And Vomiting 01/18/2015  ? ? ?Family History  ?Problem Relation Age of Onset  ? Pancreatic cancer Mother   ? Breast cancer Sister 61  ? Prostate cancer Father   ? Breast cancer Maternal Aunt   ? Breast cancer Cousin   ?     pat cousin  ? Breast cancer Sister 100  ? Colon cancer Paternal Uncle   ?  Prostate cancer Brother   ? Ovarian cancer Neg Hx   ? Diabetes Neg Hx   ? ? ?Social History  ? ?Socioeconomic History  ? Marital status: Single  ?  Spouse name: Not on file  ? Number of children: 0  ? Years of education: Not on file  ? Highest education level: Associate degree: academic program  ?Occupational History  ?  Employer: [employer]  ?Tobacco Use  ? Smoking status: Never  ?  Passive exposure: Never  ? Smokeless tobacco: Never  ? Tobacco comments:  ?  smoking cessation materials not required  ?Vaping Use  ? Vaping Use: Never used  ?Substance and Sexual Activity  ? Alcohol use: No  ?  Alcohol/week: 0.0 standard drinks  ? Drug use: No  ? Sexual activity: Not Currently  ?  Birth control/protection: Surgical  ?Other Topics Concern  ? Not on file  ?Social History Narrative  ? Pt lives alone  ? ?Social Determinants of Health  ? ?Financial Resource Strain: Low Risk   ? Difficulty of Paying Living Expenses: Not hard at all  ?Food Insecurity: No Food Insecurity  ? Worried About Charity fundraiser in the Last Year: Never true  ? Ran Out of Food in the Last Year: Never true  ?Transportation Needs: No Transportation Needs  ? Lack of Transportation (Medical): No  ? Lack of Transportation (Non-Medical): No  ?Physical Activity: Sufficiently Active  ? Days of Exercise per Week: 3 days  ? Minutes of Exercise per Session: 50 min  ?Stress: No Stress Concern Present  ? Feeling of Stress : Not at all  ?Social Connections: Socially Isolated  ? Frequency of Communication with Friends and Family: More than three times a week  ? Frequency of Social Gatherings with Friends and Family: More than three times a week  ? Attends Religious Services: Never  ? Active Member of Clubs or Organizations: No  ? Attends Archivist Meetings: Never  ? Marital Status: Never married  ?Intimate Partner Violence: Not At Risk  ? Fear of Current or Ex-Partner: No  ? Emotionally Abused: No  ? Physically Abused: No  ? Sexually  Abused: No  ? ? ?Review of Systems: ?See HPI, otherwise negative ROS ? ?Physical Exam: ?BP (!) 155/79   Pulse 71   Temp (!) 97.4 ?F (36.3 ?C) (Temporal)   Resp 18   Ht '5\' 6"'$  (1.676 m)   Wt 61.7 kg   SpO2 99%   BMI 21.95 kg/m?  ?General:   Alert, cooperative in NAD ?Head:  Normocephalic and atraumatic. ?Respiratory:  Normal work of breathing. ?Cardiovascular:  RRR ? ?Impression/Plan: ?[name] is here for cataract surgery. ? ?Risks, benefits, limitations, and alternatives regarding cataract surgery have been reviewed with the patient.  Questions have been answered.  All parties agreeable. ? ? ?Benay Pillow, MD  12/03/2021, 10:51 AM ? ? ?

## 2021-12-03 NOTE — Transfer of Care (Signed)
Immediate Anesthesia Transfer of Care Note ? ?Patient: Valerie Raymond ? ?Procedure(s) Performed: CATARACT EXTRACTION PHACO AND INTRAOCULAR LENS PLACEMENT (IOC) RIGHT 4.01 00:33.0 (Right: Eye) ? ?Patient Location: PACU ? ?Anesthesia Type: MAC ? ?Level of Consciousness: awake, alert  and patient cooperative ? ?Airway and Oxygen Therapy: Patient Spontanous Breathing and Patient connected to supplemental oxygen ? ?Post-op Assessment: Post-op Vital signs reviewed, Patient's Cardiovascular Status Stable, Respiratory Function Stable, Patent Airway and No signs of Nausea or vomiting ? ?Post-op Vital Signs: Reviewed and stable ? ?Complications: No notable events documented. ? ?

## 2021-12-03 NOTE — Anesthesia Procedure Notes (Signed)
Procedure Name: [place] ?Date/Time: 12/03/2021 11:00 AM ?Performed by: Jeannene Patella, CRNA ?Pre-anesthesia Checklist: Patient identified, Emergency Drugs available, Suction available, Timeout performed and Patient being monitored ?Patient Re-evaluated:Patient Re-evaluated prior to induction ?Oxygen Delivery Method: Nasal cannula ?Placement Confirmation: positive ETCO2 ? ? ? ? ?

## 2021-12-03 NOTE — Op Note (Signed)
OPERATIVE NOTE ? ?Valerie Raymond ?[id number] ?12/03/2021 ? ? ?PREOPERATIVE DIAGNOSIS:  Nuclear sclerotic cataract right eye.  H25.11 ?  ?POSTOPERATIVE DIAGNOSIS:    Nuclear sclerotic cataract right eye.   ?  ?PROCEDURE:  Phacoemusification with posterior chamber intraocular lens placement of the right eye  ? ?LENS:   ?Implant Name Type Inv. Item Serial No. Manufacturer Lot No. LRB No. Used Action  ?LENS IOL TECNIS EYHANCE 26.0 - [id number] Intraocular Lens LENS IOL TECNIS EYHANCE 26.0 [id number] [employer]  Right 1 Implanted  ?    ? ?Procedure(s): ?CATARACT EXTRACTION PHACO AND INTRAOCULAR LENS PLACEMENT (IOC) RIGHT 4.01 00:33.0 (Right) ? ?DIB00 +26.0 ?  ?ULTRASOUND TIME: 0 minutes 33 seconds.  CDE 4.01 ?  ?SURGEON:  Benay Pillow, MD, MPH ? ?ANESTHESIOLOGIST: Anesthesiologist: Page, Adele Barthel, MD ?CRNA: Jeannene Patella, CRNA ?  ?ANESTHESIA:  Topical with tetracaine drops augmented with 1% preservative-free intracameral lidocaine. ? ?ESTIMATED BLOOD LOSS: less than 1 mL. ?  ?COMPLICATIONS:  None. ?  ?DESCRIPTION OF PROCEDURE:  The patient was identified in the holding room and transported to the operating room and placed in the supine position under the operating microscope.  The right eye was identified as the operative eye and it was prepped and draped in the usual sterile ophthalmic fashion. ?  ?A 1.0 millimeter clear-corneal paracentesis was made at the 10:30 position. 0.5 ml of preservative-free 1% lidocaine with epinephrine was injected into the anterior chamber. ? The anterior chamber was filled with Healon 5 viscoelastic.  A 2.4 millimeter keratome was used to make a near-clear corneal incision at the 8:00 position.  A curvilinear capsulorrhexis was made with a cystotome and capsulorrhexis forceps.  Balanced salt solution was used to hydrodissect and hydrodelineate the nucleus. ?  ?Phacoemulsification was then used in stop and chop fashion to remove the lens nucleus and epinucleus.  The remaining cortex was  then removed using the irrigation and aspiration handpiece. Healon was then placed into the capsular bag to distend it for lens placement.  A lens was then injected into the capsular bag.  The remaining viscoelastic was aspirated. ?  ?Wounds were hydrated with balanced salt solution.  The anterior chamber was inflated to a physiologic pressure with balanced salt solution.  ? ?Intracameral vigamox 0.1 mL undiluted was injected into the eye and a drop placed onto the ocular surface. ? ?No wound leaks were noted.  The patient was taken to the recovery room in stable condition without complications of anesthesia or surgery ? ?Benay Pillow ?12/03/2021, 11:20 AM ? ?

## 2021-12-04 ENCOUNTER — Encounter: Payer: Self-pay | Admitting: Ophthalmology

## 2021-12-12 NOTE — Discharge Instructions (Signed)

## 2021-12-14 DIAGNOSIS — H2512 Age-related nuclear cataract, left eye: Secondary | ICD-10-CM | POA: Diagnosis not present

## 2021-12-17 ENCOUNTER — Ambulatory Visit: Payer: Medicare Other | Admitting: Anesthesiology

## 2021-12-17 ENCOUNTER — Other Ambulatory Visit: Payer: Self-pay

## 2021-12-17 ENCOUNTER — Ambulatory Visit
Admission: RE | Admit: 2021-12-17 | Discharge: 2021-12-17 | Disposition: A | Discharge status: Home / Self Care | Payer: Medicare Other | Attending: Ophthalmology | Admitting: Ophthalmology

## 2021-12-17 ENCOUNTER — Encounter
Admission: RE | Disposition: A | Discharge status: Home / Self Care | Payer: Self-pay | Source: Home / Self Care | Attending: Ophthalmology

## 2021-12-17 ENCOUNTER — Encounter: Payer: Self-pay | Admitting: Ophthalmology

## 2021-12-17 DIAGNOSIS — H2512 Age-related nuclear cataract, left eye: Secondary | ICD-10-CM | POA: Diagnosis not present

## 2021-12-17 DIAGNOSIS — K219 Gastro-esophageal reflux disease without esophagitis: Secondary | ICD-10-CM | POA: Insufficient documentation

## 2021-12-17 DIAGNOSIS — H25812 Combined forms of age-related cataract, left eye: Secondary | ICD-10-CM | POA: Diagnosis not present

## 2021-12-17 HISTORY — PX: CATARACT EXTRACTION W/PHACO: SHX586

## 2021-12-17 SURGERY — PHACOEMULSIFICATION, CATARACT, WITH IOL INSERTION
Anesthesia: Monitor Anesthesia Care | Site: Eye | Laterality: Left

## 2021-12-17 MED ORDER — SIGHTPATH DOSE#1 BSS IO SOLN
INTRAOCULAR | Status: DC | PRN
  Administered 2021-12-17: 63 mL via OPHTHALMIC

## 2021-12-17 MED ORDER — FENTANYL CITRATE (PF) 100 MCG/2ML IJ SOLN
INTRAMUSCULAR | Status: DC | PRN
  Administered 2021-12-17: 50 ug via INTRAVENOUS

## 2021-12-17 MED ORDER — MIDAZOLAM HCL 2 MG/2ML IJ SOLN
INTRAMUSCULAR | Status: DC | PRN
  Administered 2021-12-17: 2 mg via INTRAVENOUS

## 2021-12-17 MED ORDER — SIGHTPATH DOSE#1 BSS IO SOLN
INTRAOCULAR | Status: DC | PRN
Start: 2021-12-17 — End: 2021-12-17
  Administered 2021-12-17: 15 mL

## 2021-12-17 MED ORDER — LIDOCAINE HCL (PF) 2 % IJ SOLN
INTRAOCULAR | Status: DC | PRN
  Administered 2021-12-17: 1 mL via INTRAOCULAR

## 2021-12-17 MED ORDER — SIGHTPATH DOSE#1 SODIUM HYALURONATE 23 MG/ML IO SOLUTION
PREFILLED_SYRINGE | INTRAOCULAR | Status: DC | PRN
  Administered 2021-12-17: 0.6 mL via INTRAOCULAR

## 2021-12-17 MED ORDER — SIGHTPATH DOSE#1 SODIUM HYALURONATE 10 MG/ML IO SOLUTION
PREFILLED_SYRINGE | INTRAOCULAR | Status: DC | PRN
  Administered 2021-12-17: 0.85 mL via INTRAOCULAR

## 2021-12-17 MED ORDER — MOXIFLOXACIN HCL 0.5 % OP SOLN
OPHTHALMIC | Status: DC | PRN
Start: 2021-12-17 — End: 2021-12-17
  Administered 2021-12-17: 0.2 mL via OPHTHALMIC

## 2021-12-17 MED ORDER — LACTATED RINGERS IV SOLN: INTRAVENOUS | Status: DC

## 2021-12-17 MED ORDER — TETRACAINE HCL 0.5 % OP SOLN
1.0000 [drp] | OPHTHALMIC | Status: DC | PRN
  Administered 2021-12-17 (×3): 1 [drp] via OPHTHALMIC

## 2021-12-17 MED ORDER — ARMC OPHTHALMIC DILATING DROPS
1.0000 "application " | OPHTHALMIC | Status: DC | PRN
  Administered 2021-12-17 (×3): 1 via OPHTHALMIC

## 2021-12-17 SURGICAL SUPPLY — 14 items
CATARACT SUITE SIGHTPATH (MISCELLANEOUS) ×2 IMPLANT
DISSECTOR HYDRO NUCLEUS 50X22 (MISCELLANEOUS) ×2 IMPLANT
FEE CATARACT SUITE SIGHTPATH (MISCELLANEOUS) ×1 IMPLANT
GLOVE SURG GAMMEX PI TX LF 7.5 (GLOVE) ×2 IMPLANT
GLOVE SURG SYN 8.5  E (GLOVE) ×1
GLOVE SURG SYN 8.5 E (GLOVE) ×1 IMPLANT
GLOVE SURG SYN 8.5 PF PI (GLOVE) ×1 IMPLANT
LENS IOL TECNIS EYHANCE 26.5 (Intraocular Lens) ×1 IMPLANT
NDL FILTER BLUNT 18X1 1/2 (NEEDLE) ×1 IMPLANT
NEEDLE FILTER BLUNT 18X 1/2SAF (NEEDLE) ×1
NEEDLE FILTER BLUNT 18X1 1/2 (NEEDLE) ×1 IMPLANT
SYR 3ML LL SCALE MARK (SYRINGE) ×2 IMPLANT
SYR 5ML LL (SYRINGE) ×2 IMPLANT
WATER STERILE IRR 250ML POUR (IV SOLUTION) ×2 IMPLANT

## 2021-12-17 NOTE — Transfer of Care (Signed)
Immediate Anesthesia Transfer of Care Note ? ?Patient: Valerie Raymond ? ?Procedure(s) Performed: CATARACT EXTRACTION PHACO AND INTRAOCULAR LENS PLACEMENT (IOC) LEFT 2.03 00:31.7 (Left: Eye) ? ?Patient Location: PACU ? ?Anesthesia Type: MAC ? ?Level of Consciousness: awake, alert  and patient cooperative ? ?Airway and Oxygen Therapy: Patient Spontanous Breathing and Patient connected to supplemental oxygen ? ?Post-op Assessment: Post-op Vital signs reviewed, Patient's Cardiovascular Status Stable, Respiratory Function Stable, Patent Airway and No signs of Nausea or vomiting ? ?Post-op Vital Signs: Reviewed and stable ? ?Complications: No notable events documented. ? ?

## 2021-12-17 NOTE — Anesthesia Postprocedure Evaluation (Signed)
Anesthesia Post Note ? ?Patient: Keela Rubert ? ?Procedure(s) Performed: CATARACT EXTRACTION PHACO AND INTRAOCULAR LENS PLACEMENT (IOC) LEFT 2.03 00:31.7 (Left: Eye) ? ? ?  ?Patient location during evaluation: PACU ?Anesthesia Type: MAC ?Level of consciousness: awake and alert ?Pain management: pain level controlled ?Vital Signs Assessment: post-procedure vital signs reviewed and stable ?Respiratory status: spontaneous breathing, nonlabored ventilation and respiratory function stable ?Cardiovascular status: stable and blood pressure returned to baseline ?Postop Assessment: no apparent nausea or vomiting ?Anesthetic complications: no ? ? ?No notable events documented. ? ?April Manson ? ? ? ? ? ?

## 2021-12-17 NOTE — H&P (Signed)
[place]  ? ?Primary Care Physician:  [name], MD ?Ophthalmologist: Dr. Benay Pillow ? ?Pre-Procedure History & Physical: ?HPI:  Valerie Raymond is a 71 y.o. female here for cataract surgery. ?  ?Past Medical History:  ?Diagnosis Date  ? Arthralgia of hip 07/23/2015  ? Arthritis   ? knee,hip,hands  ? Degenerative disc disease, lumbar   ? DJD (degenerative joint disease)   ? Dysphagia   ? with solids and regurgitation  ? Endometriosis   ? h/o  ? GERD (gastroesophageal reflux disease)   ? occasional  ? Heartburn   ? Joint pain   ? Osteoporosis   ? hip  ? Pelvic pressure in female 01/02/2016  ? Rash   ? skin rash and itching  ? Vitamin D deficiency   ? ? ?Past Surgical History:  ?Procedure Laterality Date  ? ABDOMINAL HYSTERECTOMY  09/16/1990  ? CATARACT EXTRACTION W/PHACO Right 12/03/2021  ? Procedure: CATARACT EXTRACTION PHACO AND INTRAOCULAR LENS PLACEMENT (IOC) RIGHT 4.01 00:33.0;  Surgeon: Eulogio Bear, MD;  Location: [place];  Service: Ophthalmology;  Laterality: Right;  ? COLONOSCOPY  09/16/2004  ? COLONOSCOPY N/A 01/23/2015  ? Procedure: COLONOSCOPY;  Surgeon: Lucilla Lame, MD;  Location: [place];  Service: Gastroenterology;  Laterality: N/A;  cecum- [id number]  ? ESOPHAGEAL DILATION  04/21/2017  ? Procedure: ESOPHAGEAL DILATION;  Surgeon: Lucilla Lame, MD;  Location: [place];  Service: Endoscopy;;  ? ESOPHAGEAL DILATION  07/07/2017  ? Procedure: ESOPHAGEAL DILATION;  Surgeon: Lucilla Lame, MD;  Location: [place];  Service: Endoscopy;;  ? ESOPHAGEAL DILATION N/A 07/28/2017  ? Procedure: ESOPHAGEAL DILATION;  Surgeon: Lucilla Lame, MD;  Location: [place];  Service: Endoscopy;  Laterality: N/A;  ? ESOPHAGOGASTRODUODENOSCOPY N/A 04/21/2017  ? Procedure: ESOPHAGOGASTRODUODENOSCOPY (EGD);  Surgeon: Lucilla Lame, MD;  Location: [place];  Service: Endoscopy;  Laterality: N/A;  ? ESOPHAGOGASTRODUODENOSCOPY (EGD) WITH PROPOFOL N/A  07/07/2017  ? Procedure: ESOPHAGOGASTRODUODENOSCOPY (EGD) WITH PROPOFOL;  Surgeon: Lucilla Lame, MD;  Location: [place];  Service: Endoscopy;  Laterality: N/A;  ? ESOPHAGOGASTRODUODENOSCOPY (EGD) WITH PROPOFOL N/A 07/28/2017  ? Procedure: ESOPHAGOGASTRODUODENOSCOPY (EGD) WITH PROPOFOL;  Surgeon: Lucilla Lame, MD;  Location: [place];  Service: Endoscopy;  Laterality: N/A;  ? SALPINGOOPHORECTOMY Bilateral   ? TOTAL HIP ARTHROPLASTY Left 09/2020  ? ? ?Prior to Admission medications   ?Medication Sig Start Date End Date Taking? Authorizing Provider  ?alendronate (FOSAMAX) 70 MG tablet TAKE 1 TABLET BY MOUTH ONCE A WEEK ON AN EMPTY STOMACH WITH A GLASS OF WATER; REMAIN UPRIGHT WITHOUT EATING FOR 30 MINUTES 10/15/21  Yes [name], MD  ?ARTIFICIAL TEARS 1 % ophthalmic solution Place 1 drop into both eyes daily as needed (Dry eye).  08/10/19  Yes [provider]  ?atorvastatin (LIPITOR) 10 MG tablet TAKE 1 TABLET(10 MG) BY MOUTH DAILY 09/26/21  Yes [name], MD  ?Calcium Carbonate-Vit D-Min (CALCIUM 1200 PO) Take 1,200 mg by mouth daily.    Yes [provider]  ?Cholecalciferol (VITAMIN D3) 50 MCG (2000 UT) capsule Take 2,000 Units by mouth daily. am   Yes [provider]  ?conjugated estrogens (PREMARIN) vaginal cream PLACE VAGINALLY TWO TIMES A WEEK 10/02/21  Yes Rubie Maid, MD  ?CRANBERRY PO Take by mouth.   Yes [provider]  ?mometasone (ELOCON) 0.1 % cream Apply 1 application topically daily.   Yes [provider]  ?pantoprazole (PROTONIX) 40 MG tablet TAKE 1 TABLET(40 MG) BY  MOUTH DAILY IN THE MORNING 08/13/21  Yes [name], MD  ?meloxicam (MOBIC) 15 MG tablet TAKE 1 TABLET(15 MG) BY MOUTH DAILY AS NEEDED FOR PAIN 09/04/21   [name], MD  ? ? ?Allergies as of 11/14/2021 - Review Complete 11/01/2021  ?Allergen Reaction Noted  ? Lodine [etodolac] Diarrhea and Nausea And Vomiting 01/18/2015  ? ? ?Family History   ?Problem Relation Age of Onset  ? Pancreatic cancer Mother   ? Breast cancer Sister 24  ? Prostate cancer Father   ? Breast cancer Maternal Aunt   ? Breast cancer Cousin   ?     pat cousin  ? Breast cancer Sister 45  ? Colon cancer Paternal Uncle   ? Prostate cancer Brother   ? Ovarian cancer Neg Hx   ? Diabetes Neg Hx   ? ? ?Social History  ? ?Socioeconomic History  ? Marital status: Single  ?  Spouse name: Not on file  ? Number of children: 0  ? Years of education: Not on file  ? Highest education level: Associate degree: academic program  ?Occupational History  ?  Employer: [employer]  ?Tobacco Use  ? Smoking status: Never  ?  Passive exposure: Never  ? Smokeless tobacco: Never  ? Tobacco comments:  ?  smoking cessation materials not required  ?Vaping Use  ? Vaping Use: Never used  ?Substance and Sexual Activity  ? Alcohol use: No  ?  Alcohol/week: 0.0 standard drinks  ? Drug use: No  ? Sexual activity: Not Currently  ?  Birth control/protection: Surgical  ?Other Topics Concern  ? Not on file  ?Social History Narrative  ? Pt lives alone  ? ?Social Determinants of Health  ? ?Financial Resource Strain: Low Risk   ? Difficulty of Paying Living Expenses: Not hard at all  ?Food Insecurity: No Food Insecurity  ? Worried About Charity fundraiser in the Last Year: Never true  ? Ran Out of Food in the Last Year: Never true  ?Transportation Needs: No Transportation Needs  ? Lack of Transportation (Medical): No  ? Lack of Transportation (Non-Medical): No  ?Physical Activity: Sufficiently Active  ? Days of Exercise per Week: 3 days  ? Minutes of Exercise per Session: 50 min  ?Stress: No Stress Concern Present  ? Feeling of Stress : Not at all  ?Social Connections: Socially Isolated  ? Frequency of Communication with Friends and Family: More than three times a week  ? Frequency of Social Gatherings with Friends and Family: More than three times a week  ? Attends Religious Services: Never  ? Active Member of  Clubs or Organizations: No  ? Attends Archivist Meetings: Never  ? Marital Status: Never married  ?Intimate Partner Violence: Not At Risk  ? Fear of Current or Ex-Partner: No  ? Emotionally Abused: No  ? Physically Abused: No  ? Sexually Abused: No  ? ? ?Review of Systems: ?See HPI, otherwise negative ROS ? ?Physical Exam: ?BP (!) 150/87   Pulse 70   Temp (!) 97.5 ?F (36.4 ?C) (Temporal)   Resp 18   Ht '5\' 6"'$  (1.676 m)   Wt 64 kg   SpO2 100%   BMI 22.76 kg/m?  ?General:   Alert, cooperative in NAD ?Head:  Normocephalic and atraumatic. ?Respiratory:  Normal work of breathing. ?Cardiovascular:  RRR ? ?Impression/Plan: ?Valerie Raymond is here for cataract surgery. ? ?Risks, benefits, limitations, and alternatives regarding cataract surgery have  been reviewed with the patient.  Questions have been answered.  All parties agreeable. ? ? ?Benay Pillow, MD  12/17/2021, 8:28 AM ? ? ?

## 2021-12-17 NOTE — Anesthesia Preprocedure Evaluation (Signed)
Anesthesia Evaluation  ?Patient identified by MRN, date of birth, ID band ? ?History of Anesthesia Complications ?Negative for: history of anesthetic complications ? ?Airway ?Mallampati: II ? ?TM Distance: >3 FB ?Neck ROM: Full ? ? ? Dental ?no notable dental hx. ? ?  ?Pulmonary ?neg pulmonary ROS,  ?  ?Pulmonary exam normal ? ? ? ? ? ? ? Cardiovascular ?Exercise Tolerance: Good ?negative cardio ROS ?Normal cardiovascular exam ? ? ?  ?Neuro/Psych ?negative neurological ROS ?   ? GI/Hepatic ?Neg liver ROS, GERD  Medicated and Controlled,  ?Endo/Other  ?negative endocrine ROS ? Renal/GU ?negative Renal ROS  ? ?  ?Musculoskeletal ? ? Abdominal ?  ?Peds ? Hematology ?negative hematology ROS ?(+)   ?Anesthesia Other Findings ? ? Reproductive/Obstetrics ? ?  ? ? ? ? ? ? ? ? ? ? ? ? ? ?  ?  ? ? ? ? ? ? ? ? ?Anesthesia Physical ? ?Anesthesia Plan ? ?ASA: 2 ? ?Anesthesia Plan: MAC  ? ?Post-op Pain Management:   ? ?Induction: Intravenous ? ?PONV Risk Score and Plan: 2 and TIVA, Midazolam and Treatment may vary due to age or medical condition ? ?Airway Management Planned: Nasal Cannula and Natural Airway ? ?Additional Equipment: None ? ?Intra-op Plan:  ? ?Post-operative Plan:  ? ?Informed Consent: I have reviewed the patients History and Physical, chart, labs and discussed the procedure including the risks, benefits and alternatives for the proposed anesthesia with the patient or authorized representative who has indicated his/her understanding and acceptance.  ? ? ? ? ? ?Plan Discussed with: CRNA ? ?Anesthesia Plan Comments:   ? ? ? ? ? ? ?Anesthesia Quick Evaluation ? ?

## 2021-12-17 NOTE — Op Note (Signed)
OPERATIVE NOTE ? ?Valerie Raymond [place] ?[id number] ?12/17/2021 ? ? ?PREOPERATIVE DIAGNOSIS:  Nuclear sclerotic cataract left eye.  H25.12 ?  ?POSTOPERATIVE DIAGNOSIS:    Nuclear sclerotic cataract left eye.   ?  ?PROCEDURE:  Phacoemusification with posterior chamber intraocular lens placement of the left eye  ? ?LENS:   ?Implant Name Type Inv. Item Serial No. Manufacturer Lot No. LRB No. Used Action  ?LENS IOL TECNIS EYHANCE 26.5 - [id number] Intraocular Lens LENS IOL TECNIS EYHANCE 26.5 [id number] [employer]  Left 1 Implanted  ?    ?Procedure(s): ?CATARACT EXTRACTION PHACO AND INTRAOCULAR LENS PLACEMENT (IOC) LEFT 2.03 00:31.7 (Left) ? ?DIB00 +26.5 ?  ?ULTRASOUND TIME: 0 minutes 31 seconds.  CDE 2.03 ?  ?SURGEON:  Benay Pillow, MD, MPH ?  ?ANESTHESIA:  Topical with tetracaine drops augmented with 1% preservative-free intracameral lidocaine. ? ?ESTIMATED BLOOD LOSS: <1 mL ?  ?COMPLICATIONS:  None. ?  ?DESCRIPTION OF PROCEDURE:  The patient was identified in the holding room and transported to the operating room and placed in the supine position under the operating microscope.  The left eye was identified as the operative eye and it was prepped and draped in the usual sterile ophthalmic fashion. ?  ?A 1.0 millimeter clear-corneal paracentesis was made at the 5:00 position. 0.5 ml of preservative-free 1% lidocaine with epinephrine was injected into the anterior chamber. ? The anterior chamber was filled with Healon 5 viscoelastic.  A 2.4 millimeter keratome was used to make a near-clear corneal incision at the 2:00 position.  A curvilinear capsulorrhexis was made with a cystotome and capsulorrhexis forceps.  Balanced salt solution was used to hydrodissect and hydrodelineate the nucleus. ?  ?Phacoemulsification was then used in stop and chop fashion to remove the lens nucleus and epinucleus.  The remaining cortex was then removed using the irrigation and aspiration handpiece. Healon was then placed into the capsular bag to  distend it for lens placement.  A lens was then injected into the capsular bag.  The remaining viscoelastic was aspirated. ?  ?Wounds were hydrated with balanced salt solution.  The anterior chamber was inflated to a physiologic pressure with balanced salt solution. ? ?Intracameral vigamox 0.1 mL undiltued was injected into the eye and a drop placed onto the ocular surface. ? No wound leaks were noted.  The patient was taken to the recovery room in stable condition without complications of anesthesia or surgery ? ?Benay Pillow ?12/17/2021, 8:52 AM ? ?

## 2021-12-18 ENCOUNTER — Encounter: Payer: Self-pay | Admitting: Ophthalmology

## 2022-01-08 DIAGNOSIS — Z961 Presence of intraocular lens: Secondary | ICD-10-CM | POA: Diagnosis not present

## 2022-02-14 ENCOUNTER — Other Ambulatory Visit: Payer: Self-pay | Admitting: Obstetrics and Gynecology

## 2022-02-14 ENCOUNTER — Other Ambulatory Visit: Payer: Self-pay | Admitting: Internal Medicine

## 2022-02-14 DIAGNOSIS — M5136 Other intervertebral disc degeneration, lumbar region: Secondary | ICD-10-CM

## 2022-02-14 NOTE — Telephone Encounter (Signed)
Requested Prescriptions  Pending Prescriptions Disp Refills  . meloxicam (MOBIC) 15 MG tablet [Pharmacy Med Name: MELOXICAM $RemoveBeforeD'15MG'[id number]$  TABLETS] 90 tablet 0    Sig: TAKE 1 TABLET(15 MG) BY MOUTH DAILY AS NEEDED FOR PAIN     Analgesics:  COX2 Inhibitors Failed - 02/14/2022  8:16 AM      Failed - Manual Review: Labs are only required if the patient has taken medication for more than 8 weeks.      Passed - HGB in normal range and within 360 days    Hemoglobin  Date Value Ref Range Status  05/15/2021 12.5 11.1 - 15.9 g/dL Final         Passed - Cr in normal range and within 360 days    Creatinine, Ser  Date Value Ref Range Status  05/15/2021 0.82 0.57 - 1.00 mg/dL Final         Passed - HCT in normal range and within 360 days    Hematocrit  Date Value Ref Range Status  05/15/2021 39.1 34.0 - 46.6 % Final         Passed - AST in normal range and within 360 days    AST  Date Value Ref Range Status  05/15/2021 35 0 - 40 IU/L Final         Passed - ALT in normal range and within 360 days    ALT  Date Value Ref Range Status  05/15/2021 21 0 - 32 IU/L Final         Passed - eGFR is 30 or above and within 360 days    GFR calc Af Amer  Date Value Ref Range Status  09/11/2020 97 >59 mL/min/1.73 Final    Comment:    **In accordance with recommendations from the NKF-ASN Task force,**   [facility] is in the process of updating its eGFR calculation to the   2021 CKD-EPI creatinine equation that estimates kidney function   without a race variable.    GFR, Estimated  Date Value Ref Range Status  08/09/2020 >60 >60 mL/min Final    Comment:    (NOTE) Calculated using the CKD-EPI Creatinine Equation (2021)    GFR calc non Af Amer  Date Value Ref Range Status  09/11/2020 84 >59 mL/min/1.73 Final   eGFR  Date Value Ref Range Status  05/15/2021 77 >59 mL/min/1.73 Final         Passed - Patient is not pregnant      Passed - Valid encounter within last 12 months    Recent Outpatient  Visits          4 months ago [facility] [name], MD   9 months ago Annual physical exam   [facility] [name], MD   1 year ago Hyperlipidemia, mild   [facility] [name], MD   1 year ago Elevated BP without diagnosis of hypertension   [facility] [name], MD   1 year ago Annual physical exam   [facility] [name], MD      Future Appointments            In 3 months Army Melia Jesse Sans, MD [facility], [facility]

## 2022-03-20 ENCOUNTER — Other Ambulatory Visit: Payer: Self-pay | Admitting: Internal Medicine

## 2022-03-20 DIAGNOSIS — Z1231 Encounter for screening mammogram for malignant neoplasm of breast: Secondary | ICD-10-CM

## 2022-03-26 ENCOUNTER — Other Ambulatory Visit: Payer: Self-pay | Admitting: Internal Medicine

## 2022-03-26 DIAGNOSIS — E785 Hyperlipidemia, unspecified: Secondary | ICD-10-CM

## 2022-03-26 NOTE — Telephone Encounter (Signed)
Requested Prescriptions  Pending Prescriptions Disp Refills  . atorvastatin (LIPITOR) 10 MG tablet [Pharmacy Med Name: ATORVASTATIN '10MG'$  TABLETS] 90 tablet 1    Sig: TAKE 1 TABLET(10 MG) BY MOUTH DAILY     Cardiovascular:  Antilipid - Statins Failed - 03/26/2022  3:37 AM      Failed - Lipid Panel in normal range within the last 12 months    Cholesterol, Total  Date Value Ref Range Status  05/15/2021 154 100 - 199 mg/dL Final   LDL Chol Calc (NIH)  Date Value Ref Range Status  05/15/2021 71 0 - 99 mg/dL Final   HDL  Date Value Ref Range Status  05/15/2021 66 >39 mg/dL Final   Triglycerides  Date Value Ref Range Status  05/15/2021 91 0 - 149 mg/dL Final         Passed - Patient is not pregnant      Passed - Valid encounter within last 12 months    Recent Outpatient Visits          5 months ago [facility] [name], MD   10 months ago Annual physical exam   [facility] [name], MD   1 year ago Hyperlipidemia, mild   [facility] [name], MD   1 year ago Elevated BP without diagnosis of hypertension   [facility] [name], MD   1 year ago Annual physical exam   [facility] [name], MD      Future Appointments            In 1 month Army Melia Jesse Sans, MD [facility], [facility]

## 2022-04-01 ENCOUNTER — Other Ambulatory Visit: Payer: Self-pay | Admitting: Internal Medicine

## 2022-04-02 NOTE — Telephone Encounter (Signed)
Requested medications are due for refill today.  yes  Requested medications are on the active medications list.  yes  Last refill. 10/15/2021 #4 5 refilss  Future visit scheduled.   yes  Notes to clinic.  Failed protocol d/t missing labs.    Requested Prescriptions  Pending Prescriptions Disp Refills   alendronate (FOSAMAX) 70 MG tablet [Pharmacy Med Name: ALENDRONATE 70MG TABLETS] 4 tablet 5    Sig: TAKE 1 TABLET BY MOUTH ONCE A WEEK ON AN EMPTY STOMACH WITH A GLASS OF WATER; REMAIN UPRIGHT WITHOUT EATING FOR 6 MINUTES     Endocrinology:  Bisphosphonates Failed - 04/01/2022 11:59 AM      Failed - Vitamin D in normal range and within 360 days    25-Hydroxy, Vitamin D-3  Date Value Ref Range Status  08/04/2019 39 ng/mL Final    Comment:    This test was developed and its performance characteristics determined by [facility]. It has not been cleared or approved by the Food and Drug Administration.    25-Hydroxy, Vitamin D-2  Date Value Ref Range Status  08/04/2019 <1.0 ng/mL Final    Comment:    This test was developed and its performance characteristics determined by [facility]. It has not been cleared or approved by the Food and Drug Administration.    25-Hydroxy, Vitamin D  Date Value Ref Range Status  08/04/2019 39 ng/mL Final    Comment:    Reference Range: All Ages: Target levels 30 - 100    Vit D, 25-Hydroxy  Date Value Ref Range Status  09/11/2020 50.0 30.0 - 100.0 ng/mL Final    Comment:    Vitamin D deficiency has been defined by the [place] practice guideline as a level of serum 25-OH vitamin D less than 20 ng/mL (1,2). The Endocrine Society went on to further define vitamin D insufficiency as a level between 21 and 29 ng/mL (2). 1. IOM (Institute of Medicine). 2010. Dietary reference    intakes for calcium and D. [place]: The    [employer]. 2. Holick MF, Binkley [place], Bischoff-Ferrari HA, et al.     Evaluation, treatment, and prevention of vitamin D    deficiency: an Endocrine Society clinical practice    guideline. JCEM. 2011 Jul; 96(7):1911-30.          Failed - Mg Level in normal range and within 360 days    Magnesium  Date Value Ref Range Status  08/04/2019 2.3 1.6 - 2.3 mg/dL Final         Failed - Phosphate in normal range and within 360 days    No results found for: "PHOS"       Failed - Bone Mineral Density or Dexa Scan completed in the last 2 years      Passed - Ca in normal range and within 360 days    Calcium  Date Value Ref Range Status  05/15/2021 10.3 8.7 - 10.3 mg/dL Final         Passed - Cr in normal range and within 360 days    Creatinine, Ser  Date Value Ref Range Status  05/15/2021 0.82 0.57 - 1.00 mg/dL Final         Passed - eGFR is 30 or above and within 360 days    GFR calc Af Amer  Date Value Ref Range Status  09/11/2020 97 >59 mL/min/1.73 Final    Comment:    **In accordance with recommendations from the NKF-ASN  Task force,**   [facility] is in the process of updating its eGFR calculation to the   2021 CKD-EPI creatinine equation that estimates kidney function   without a race variable.    GFR, Estimated  Date Value Ref Range Status  08/09/2020 >60 >60 mL/min Final    Comment:    (NOTE) Calculated using the CKD-EPI Creatinine Equation (2021)    GFR calc non Af Amer  Date Value Ref Range Status  09/11/2020 84 >59 mL/min/1.73 Final   eGFR  Date Value Ref Range Status  05/15/2021 77 >59 mL/min/1.73 Final         Passed - Valid encounter within last 12 months    Recent Outpatient Visits           6 months ago [facility] [name], MD   10 months ago Annual physical exam   [facility] [name], MD   1 year ago Hyperlipidemia, mild   [facility] [name], MD   1 year ago Elevated BP without diagnosis of hypertension   [facility] [name], MD    1 year ago Annual physical exam   [facility] [name], MD       Future Appointments             In 1 month Army Melia Jesse Sans, MD [facility], [facility]

## 2022-04-15 ENCOUNTER — Ambulatory Visit
Admission: RE | Admit: 2022-04-15 | Discharge: 2022-04-15 | Disposition: A | Discharge status: Home / Self Care | Payer: Medicare Other | Source: Ambulatory Visit | Attending: Internal Medicine | Admitting: Internal Medicine

## 2022-04-15 ENCOUNTER — Encounter: Payer: Self-pay | Admitting: Radiology

## 2022-04-15 DIAGNOSIS — Z1231 Encounter for screening mammogram for malignant neoplasm of breast: Secondary | ICD-10-CM | POA: Diagnosis not present

## 2022-04-29 ENCOUNTER — Ambulatory Visit: Payer: Medicare Other

## 2022-04-30 ENCOUNTER — Ambulatory Visit (INDEPENDENT_AMBULATORY_CARE_PROVIDER_SITE_OTHER): Payer: Medicare Other

## 2022-04-30 VITALS — Resp 18 | Ht 65.5 in | Wt 143.2 lb

## 2022-04-30 DIAGNOSIS — Z0000 Encounter for general adult medical examination without abnormal findings: Secondary | ICD-10-CM

## 2022-04-30 NOTE — Patient Instructions (Signed)

## 2022-04-30 NOTE — Progress Notes (Addendum)
Subjective:   Valerie Raymond is a 71 y.o. female who presents for Medicare Annual (Subsequent) preventive examination.  Review of Systems  Per HPI unless specifically indicated below Cardiac Risk Factors include: advanced age (>27mn, >>27women);female gender, hyperlipidemia         Objective:       04/30/2022    8:26 AM 12/17/2021    8:58 AM 12/17/2021    8:56 AM  Vitals with BMI  Height 5' 5.5"    Weight 143 lbs 3 oz    BMI 23.46    Pulse  63 60    Today's Vitals   04/30/22 0826  Resp: 18  Weight: 143 lb 3.2 oz (65 kg)  Height: 5' 5.5" (1.664 m)   Body mass index is 23.47 kg/m.     12/17/2021    7:30 AM 12/03/2021   10:04 AM 04/23/2021    8:23 AM 08/09/2020    8:15 AM 06/19/2020    8:33 AM 05/11/2020    1:44 PM 04/25/2020    7:57 AM  Advanced Directives  Does Patient Have a Medical Advance Directive? No No No No No No No  Would patient like information on creating a medical advance directive? No - Patient declined No - Patient declined No - Patient declined No - Patient declined No - Patient declined  No - Patient declined    Current Medications (verified) Outpatient Encounter Medications as of 04/30/2022  Medication Sig   ARTIFICIAL TEARS 1 % ophthalmic solution Place 1 drop into both eyes daily as needed (Dry eye).    atorvastatin (LIPITOR) 10 MG tablet TAKE 1 TABLET(10 MG) BY MOUTH DAILY   Calcium Carbonate-Vit D-Min (CALCIUM 1200 PO) Take 1,200 mg by mouth daily.    Cholecalciferol (VITAMIN D3) 50 MCG (2000 UT) capsule Take 2,000 Units by mouth daily. am   co-enzyme Q-10 30 MG capsule Take 30 mg by mouth daily.   conjugated estrogens (PREMARIN) vaginal cream PLACE VAGINALLY 2 TIMES A WEEK   CRANBERRY PO Take by mouth.   meloxicam (MOBIC) 15 MG tablet TAKE 1 TABLET(15 MG) BY MOUTH DAILY AS NEEDED FOR PAIN   mometasone (ELOCON) 0.1 % cream Apply 1 application topically daily.   pantoprazole (PROTONIX) 40 MG tablet TAKE 1 TABLET(40 MG) BY MOUTH DAILY IN THE MORNING    alendronate (FOSAMAX) 70 MG tablet TAKE 1 TABLET BY MOUTH ONCE A WEEK ON AN EMPTY STOMACH WITH A GLASS OF WATER; REMAIN UPRIGHT WITHOUT EATING FOR 30 MINUTES (Patient not taking: Reported on 04/30/2022)   No facility-administered encounter medications on file as of 04/30/2022.    Allergies (verified) Lodine [etodolac]   History: Past Medical History:  Diagnosis Date   Arthralgia of hip 07/23/2015   Arthritis    knee,hip,hands   Degenerative disc disease, lumbar    DJD (degenerative joint disease)    Dysphagia    with solids and regurgitation   Endometriosis    h/o   GERD (gastroesophageal reflux disease)    occasional   Heartburn    Joint pain    Osteoporosis    hip   Pelvic pressure in female 01/02/2016   Rash    skin rash and itching   Vitamin D deficiency    Past Surgical History:  Procedure Laterality Date   ABDOMINAL HYSTERECTOMY  09/16/1990   CATARACT EXTRACTION W/PHACO Right 12/03/2021   Procedure: CATARACT EXTRACTION PHACO AND INTRAOCULAR LENS PLACEMENT ([place] RIGHT 4.01 00:33.0;  Surgeon: KEulogio Bear MD;  [facility];  Service: Ophthalmology;  Laterality: Right;   CATARACT EXTRACTION W/PHACO Left 12/17/2021   Procedure: CATARACT EXTRACTION PHACO AND INTRAOCULAR LENS PLACEMENT (IOC) LEFT 2.03 00:31.7;  Surgeon: Eulogio Bear, MD;  Location: [place];  Service: Ophthalmology;  Laterality: Left;   COLONOSCOPY  09/16/2004   COLONOSCOPY N/A 01/23/2015   Procedure: COLONOSCOPY;  Surgeon: Lucilla Lame, MD;  Location: [place];  Service: Gastroenterology;  Laterality: N/A;  cecum- [phone]   ESOPHAGEAL DILATION  04/21/2017   Procedure: ESOPHAGEAL DILATION;  Surgeon: Lucilla Lame, MD;  Location: [place];  Service: Endoscopy;;   ESOPHAGEAL DILATION  07/07/2017   Procedure: ESOPHAGEAL DILATION;  Surgeon: Lucilla Lame, MD;  Location: [place];  Service: Endoscopy;;   ESOPHAGEAL DILATION N/A 07/28/2017    Procedure: ESOPHAGEAL DILATION;  Surgeon: Lucilla Lame, MD;  Location: [place];  Service: Endoscopy;  Laterality: N/A;   ESOPHAGOGASTRODUODENOSCOPY N/A 04/21/2017   Procedure: ESOPHAGOGASTRODUODENOSCOPY (EGD);  Surgeon: Lucilla Lame, MD;  Location: [place];  Service: Endoscopy;  Laterality: N/A;   ESOPHAGOGASTRODUODENOSCOPY (EGD) WITH PROPOFOL N/A 07/07/2017   Procedure: ESOPHAGOGASTRODUODENOSCOPY (EGD) WITH PROPOFOL;  Surgeon: Lucilla Lame, MD;  Location: [place];  Service: Endoscopy;  Laterality: N/A;   ESOPHAGOGASTRODUODENOSCOPY (EGD) WITH PROPOFOL N/A 07/28/2017   Procedure: ESOPHAGOGASTRODUODENOSCOPY (EGD) WITH PROPOFOL;  Surgeon: Lucilla Lame, MD;  Location: [place];  Service: Endoscopy;  Laterality: N/A;   SALPINGOOPHORECTOMY Bilateral    TOTAL HIP ARTHROPLASTY Left 09/2020   Family History  Problem Relation Age of Onset   Pancreatic cancer Mother    Breast cancer Sister 46   Prostate cancer Father    Breast cancer Maternal Aunt    Breast cancer Cousin        pat cousin   Breast cancer Sister 43   Colon cancer Paternal Uncle    Prostate cancer Brother    Ovarian cancer Neg Hx    Diabetes Neg Hx    Social History   Socioeconomic History   Marital status: Single    Spouse name: Not on file   Number of children: 0   Years of education: Not on file   Highest education level: Associate degree: academic program  Occupational History    Employer: [employer]  Tobacco Use   Smoking status: Never    Passive exposure: Never   Smokeless tobacco: Never   Tobacco comments:    smoking cessation materials not required  Vaping Use   Vaping Use: Never used  Substance and Sexual Activity   Alcohol use: No    Alcohol/week: 0.0 standard drinks of alcohol   Drug use: No   Sexual activity: Not Currently    Birth control/protection: Surgical  Other Topics Concern   Not on file  Social History Narrative   Pt lives alone    Social Determinants of Health   Financial Resource Strain: Low Risk  (04/30/2022)   Overall Financial Resource Strain (CARDIA)    Difficulty of Paying Living Expenses: Not hard at all  Food Insecurity: No Food Insecurity (04/30/2022)   Hunger Vital Sign    Worried About Running Out of Food in the Last Year: Never true    Ran Out of Food in the Last Year: Never true  Transportation Needs: No Transportation Needs (04/30/2022)   PRAPARE - Hydrologist (Medical): No    Lack of Transportation (Non-Medical): No  Physical Activity: Sufficiently Active (04/30/2022)   Exercise Vital Sign  Days of Exercise per Week: 4 days    Minutes of Exercise per Session: 50 min  Stress: No Stress Concern Present (04/30/2022)   [place]    Feeling of Stress : Not at all  Social Connections: Socially Isolated (04/30/2022)   Social Connection and Isolation Panel [NHANES]    Frequency of Communication with Friends and Family: More than three times a week    Frequency of Social Gatherings with Friends and Family: More than three times a week    Attends Religious Services: Never    Marine scientist or Organizations: No    Attends Music therapist: Never    Marital Status: Never married    Tobacco Counseling Counseling given: Not Answered Tobacco comments: smoking cessation materials not required   Clinical Intake:  Pre-visit preparation completed: No  Pain : No/denies pain     Nutritional Status: BMI of 19-24  Normal Nutritional Risks: None Diabetes: No  How often do you need to have someone help you when you read instructions, pamphlets, or other written materials from your doctor or pharmacy?: 1 - Never  Diabetic?No   Interpreter Needed?: No  Information entered by :: Donnie Mesa, CMA   Activities of Daily Living    04/30/2022    8:15 AM 12/17/2021    7:29 AM  In your  present state of health, do you have any difficulty performing the following activities:  Hearing? 0 0  Vision? 1 0  Comment Dr. Edison Pace, cataract surgery x 12/17/2021   Difficulty concentrating or making decisions? 0 0  Walking or climbing stairs? 0 0  Dressing or bathing? 0   Doing errands, shopping? 0     Patient Care Team: [name], MD as PCP - General (Internal Medicine) Ree Edman, MD as Consulting Physician (Dermatology) Milinda Pointer, MD as Referring Physician (Pain Medicine) Rubie Maid, MD as Referring Physician (Obstetrics and Gynecology)  Indicate any recent Medical Services you may have received from other than [facility] providers in the past year (date may be approximate). Cataract surgery on 12/03/21 and 12/17/2021 at [facility].     Assessment:   This is a routine wellness examination for [place].  Hearing/Vision screen Denies any hearing issues. Denies any vision issues since recent cataract surgery on 12/17/2021. Annual Eye Exam   Dietary issues and exercise activities discussed:     Goals Addressed             This Visit's Progress    Stay Active and Independent. Strength/ balance training       Why is this important?   Regular activity or exercise is important to managing back pain.  Activity helps to keep your muscles strong.  You will sleep better and feel more relaxed.  You will have more energy and feel less stressed.  If you are not active now, start slowly. Little changes make a big difference.  Rest, but not too much.  Stay as active as you can and listen to your body's signals.            Depression Screen    04/30/2022    8:12 AM 10/01/2021    1:23 PM 05/15/2021    9:21 AM 04/23/2021    8:22 AM 09/11/2020   10:19 AM 06/19/2020    8:33 AM 05/11/2020    1:44 PM  PHQ 2/9 Scores  PHQ - 2 Score 0 0 0 0 0 0 0  PHQ- 9 Score  0 0  0      Fall Risk    04/30/2022    8:14 AM 10/01/2021    1:24 PM 05/15/2021    9:21 AM  04/23/2021    8:25 AM 09/11/2020   10:19 AM  Fall Risk   Falls in the past year? 0 0 0 1 0  Number falls in past yr: 0 0 0 0   Injury with Fall? 0 0 0 0   Risk for fall due to : No Fall Risks No Fall Risks No Fall Risks No Fall Risks   Follow up Falls evaluation completed Falls evaluation completed Falls evaluation completed Falls prevention discussed Falls evaluation completed    [place]:  Any stairs in or around the home? Yes  If so, are there any without handrails? No  Home free of loose throw rugs in walkways, pet beds, electrical cords, etc? Yes  Adequate lighting in your home to reduce risk of falls? Yes   ASSISTIVE DEVICES UTILIZED TO PREVENT FALLS:  Life alert? No , pt carries a telephone in her pocket everywhere she goes  Use of a cane, walker or w/c? No  Grab bars in the bathroom? No  Shower chair or bench in shower? Yes  Elevated toilet seat or a handicapped toilet? Yes   TIMED UP AND GO:  Was the test performed? Yes .  Length of time to ambulate 10 feet: 10 sec.   Gait steady and fast without use of assistive device  Cognitive Function:        04/30/2022    8:17 AM 03/29/2019    9:35 AM 03/23/2018    9:20 AM 03/17/2017    8:29 AM  6CIT Screen  What Year? 0 points 0 points 0 points 0 points  What month? 0 points 0 points 0 points 0 points  What time? 0 points 0 points 0 points 0 points  Count back from 20 0 points 0 points 0 points 0 points  Months in reverse 0 points 0 points 0 points 0 points  Repeat phrase 0 points 0 points 2 points 2 points  Total Score 0 points 0 points 2 points 2 points    Immunizations Immunization History  Administered Date(s) Administered   Fluad Quad(high Dose 65+) 05/20/2019, 06/07/2020   Influenza-Unspecified 05/23/2015, 06/01/2018, 06/25/2021   Moderna Covid-19 Vaccine Bivalent Booster 101yr & up 06/04/2021   PFIZER(Purple Top)SARS-COV-2 Vaccination 11/11/2019, 12/07/2019, 06/19/2020,  02/13/2021   Pneumococcal Conjugate-13 03/17/2017   Pneumococcal Polysaccharide-23 03/23/2018   Tdap 03/13/2016   Zoster Recombinat (Shingrix) 08/31/2018, 11/18/2018   Zoster, Live 08/19/2011    TDAP status: Up to date  Flu Vaccine status: Up to date  Pneumococcal vaccine status: Up to date  Covid-19 vaccine status: Completed vaccines  Qualifies for Shingles Vaccine? Yes   Zostavax completed Yes   Shingrix Completed?: Yes  Screening Tests Health Maintenance  Topic Date Due   COVID-19 Vaccine (6 - Pfizer risk series) 07/30/2021   INFLUENZA VACCINE  04/16/2022   MAMMOGRAM  04/16/2023   COLONOSCOPY (Pts 45-423yrInsurance coverage will need to be confirmed)  01/22/2025   TETANUS/TDAP  03/13/2026   Pneumonia Vaccine 6599Years old  Completed   DEXA SCAN  Completed   Hepatitis C Screening  Completed   Zoster Vaccines- Shingrix  Completed   HPV VACCINES  Aged Out    Health Maintenance  Health Maintenance Due  Topic Date Due   COVID-19 Vaccine (  6 - Pfizer risk series) 07/30/2021   INFLUENZA VACCINE  04/16/2022    Colorectal cancer screening: Type of screening: Colonoscopy. Completed 01/23/2015. Repeat every 10 years  Mammogram status: Completed 04/15/2022. Repeat every year  DEXA Scan: completed 04/13/2018   Lung Cancer Screening: (Low Dose CT Chest recommended if Age 3-80 years, 30 pack-year currently smoking OR have quit w/in 15years.) does not qualify.   Lung Cancer Screening Referral: does not qualify   Additional Screening:  Hepatitis C Screening: does qualify; Completed 03/13/2016   Vision Screening: Recommended annual ophthalmology exams for early detection of glaucoma and other disorders of the eye. Is the patient up to date with their annual eye exam?  Yes  Who is the provider or what is the name of the office in which the patient attends annual eye exams? Dr. Benay Pillow  If pt is not established with a provider, would they like to be referred to a  provider to establish care? No .   Dental Screening: Recommended annual dental exams for proper oral hygiene  Community Resource Referral / Chronic Care Management: CRR required this visit?  No   CCM required this visit?  No      Plan:     I have personally reviewed and noted the following in the patient's chart:   Medical and social history Use of alcohol, tobacco or illicit drugs  Current medications and supplements including opioid prescriptions.  Functional ability and status Nutritional status Physical activity Advanced directives List of other physicians Hospitalizations, surgeries, and ER visits in previous 12 months Vitals Screenings to include cognitive, depression, and falls Referrals and appointments  In addition, I have reviewed and discussed with patient certain preventive protocols, quality metrics, and best practice recommendations. A written personalized care plan for preventive services as well as general preventive health recommendations were provided to patient.    Ms. Burnsworth , Thank you for taking time to come for your Medicare Wellness Visit. I appreciate your ongoing commitment to your health goals. Please review the following plan we discussed and let me know if I can assist you in the future.   These are the goals we discussed:  Goals      DIET - INCREASE WATER INTAKE     Recommend to drink at least 6-8 8oz glasses of water per day.     Increase physical activity     Pt would like to be able to get back to the gym once opened, she was going 4 days per week prior to covid-19.     Stay Active and Independent. Strength/ balance training     Why is this important?   Regular activity or exercise is important to managing back pain.  Activity helps to keep your muscles strong.  You will sleep better and feel more relaxed.  You will have more energy and feel less stressed.  If you are not active now, start slowly. Little changes make a big difference.   Rest, but not too much.  Stay as active as you can and listen to your body's signals.          Weight < 200 lb (90.719 kg)        This is a list of the screening recommended for you and due dates:  Health Maintenance  Topic Date Due   COVID-19 Vaccine (6 - Pfizer risk series) 07/30/2021   Flu Shot  04/16/2022   Mammogram  04/16/2023   Colon Cancer Screening  01/22/2025  Tetanus Vaccine  03/13/2026   Pneumonia Vaccine  Completed   DEXA scan (bone density measurement)  Completed   Hepatitis C Screening: USPSTF Recommendation to screen - Ages 14-79 yo.  Completed   Zoster (Shingles) Vaccine  Completed   HPV Vaccine  Aged [address], [place]   04/30/2022   Nurse Notes: 40 minute face-to-face visit

## 2022-05-01 NOTE — Progress Notes (Signed)
GYNECOLOGY PROGRESS NOTE  Subjective:    Patient ID: Valerie Raymond, female    DOB: 1951/07/19, 71 y.o.   MRN: [id number]  HPI  Patient is a 71 y.o. G0P0 female who presents for pessary maintenance. She currently has yearly pessary checks as she is able to perform self-maintenance. Removes and cleans pessary daily. She has a h/o cysttocele, incomplete bladder emptying, remote h/o hysterectomy, and vaginal atrophy.  Currently using Premarin cream for pessary maintenance. She reports that she has no concerns with her pessary. Also has a h/o interstitial cystitis, has a flare several months ago, also had a UTI. Currently denies symptoms.   The following portions of the patient's history were reviewed and updated as appropriate:   She  has a past medical history of Arthralgia of hip (07/23/2015), Arthritis, Degenerative disc disease, lumbar, DJD (degenerative joint disease), Dysphagia, Endometriosis, GERD (gastroesophageal reflux disease), Heartburn, Joint pain, Osteoporosis, Pelvic pressure in female (01/02/2016), Rash, and Vitamin D deficiency.  She  has a past surgical history that includes Colonoscopy (09/16/2004); Colonoscopy (N/A, 01/23/2015); Abdominal hysterectomy (09/16/1990); Salpingoophorectomy (Bilateral); Esophagogastroduodenoscopy (N/A, 04/21/2017); Esophageal dilation (04/21/2017); Esophagogastroduodenoscopy (egd) with propofol (N/A, 07/07/2017); Esophageal dilation (07/07/2017); Esophagogastroduodenoscopy (egd) with propofol (N/A, 07/28/2017); Esophageal dilation (N/A, 07/28/2017); Total hip arthroplasty (Left, 09/2020); Cataract extraction w/PHACO (Right, 12/03/2021); and Cataract extraction w/PHACO (Left, 12/17/2021).  Her family history includes Breast cancer in her cousin and maternal aunt; Breast cancer (age of onset: 60) in her sister; Breast cancer (age of onset: 32) in her sister; Colon cancer in her paternal uncle; Pancreatic cancer in her mother; Prostate cancer in her brother  and father.  Current Outpatient Medications on File Prior to Visit  Medication Sig Dispense Refill   ARTIFICIAL TEARS 1 % ophthalmic solution Place 1 drop into both eyes daily as needed (Dry eye).      atorvastatin (LIPITOR) 10 MG tablet TAKE 1 TABLET(10 MG) BY MOUTH DAILY 90 tablet 0   Calcium Carbonate-Vit D-Min (CALCIUM 1200 PO) Take 1,200 mg by mouth daily.      Cholecalciferol (VITAMIN D3) 50 MCG (2000 UT) capsule Take 2,000 Units by mouth daily. am     co-enzyme Q-10 30 MG capsule Take 30 mg by mouth daily.     conjugated estrogens (PREMARIN) vaginal cream PLACE VAGINALLY 2 TIMES A WEEK 30 g 1   CRANBERRY PO Take by mouth.     meloxicam (MOBIC) 15 MG tablet TAKE 1 TABLET(15 MG) BY MOUTH DAILY AS NEEDED FOR PAIN 90 tablet 0   mometasone (ELOCON) 0.1 % cream Apply 1 application topically daily.     pantoprazole (PROTONIX) 40 MG tablet TAKE 1 TABLET(40 MG) BY MOUTH DAILY IN THE MORNING 90 tablet 3   No current facility-administered medications on file prior to visit.   She is allergic to lodine [etodolac]..  Review of Systems Pertinent items are noted in HPI.   Objective:   Blood pressure (!) 159/70, pulse 68, resp. rate 16, height 5' 5.5" (1.664 m), weight 144 lb 6.4 oz (65.5 kg). Body mass index is 23.66 kg/m.  General appearance: alert, cooperative, and no distress Abdomen: soft, non-tender; bowel sounds normal; no masses,  no organomegaly Pelvic: The patient's Size 3 ring with support pessary was removed, cleaned and replaced without complications. Speculum examination revealed normal vaginal mucosa with no lesions or lacerations.     Assessment:   1. Pessary maintenance   2. Vaginal atrophy   3. Interstitial cystitis   4. Midline cystocele  Plan:   - The patient should return yearly for a pessary check and continue to use vaginal estrogen cream weekly as prescribed.Continue routine daily self-maintenance.   - Discussed management of interstitial cystitis  flares. Currently notes that Estrogen cream is helping.  - RTC in 1 year.    Rubie Maid, MD [facility]

## 2022-05-02 ENCOUNTER — Encounter: Payer: Medicare Other | Admitting: Obstetrics and Gynecology

## 2022-05-03 ENCOUNTER — Encounter: Payer: Self-pay | Admitting: Obstetrics and Gynecology

## 2022-05-03 ENCOUNTER — Ambulatory Visit: Payer: Medicare Other | Admitting: Obstetrics and Gynecology

## 2022-05-03 VITALS — BP 159/70 | HR 68 | Resp 16 | Ht 65.5 in | Wt 144.4 lb

## 2022-05-03 DIAGNOSIS — N8111 Cystocele, midline: Secondary | ICD-10-CM | POA: Diagnosis not present

## 2022-05-03 DIAGNOSIS — N952 Postmenopausal atrophic vaginitis: Secondary | ICD-10-CM

## 2022-05-03 DIAGNOSIS — N3010 Interstitial cystitis (chronic) without hematuria: Secondary | ICD-10-CM | POA: Diagnosis not present

## 2022-05-03 DIAGNOSIS — Z4689 Encounter for fitting and adjustment of other specified devices: Secondary | ICD-10-CM

## 2022-05-07 ENCOUNTER — Encounter: Payer: Self-pay | Admitting: Urology

## 2022-05-07 ENCOUNTER — Ambulatory Visit: Payer: Medicare Other | Admitting: Urology

## 2022-05-07 VITALS — BP 163/81 | HR 67 | Ht 65.5 in | Wt 143.0 lb

## 2022-05-07 DIAGNOSIS — R102 Pelvic and perineal pain: Secondary | ICD-10-CM | POA: Diagnosis not present

## 2022-05-07 DIAGNOSIS — N958 Other specified menopausal and perimenopausal disorders: Secondary | ICD-10-CM

## 2022-05-07 DIAGNOSIS — N3281 Overactive bladder: Secondary | ICD-10-CM

## 2022-05-07 NOTE — Progress Notes (Signed)
   05/07/2022 9:36 AM   Valerie Raymond January 08, 1951 [id number]  Reason for visit: Follow up genitourinary syndrome of menopause/interstitial cystitis  HPI: 70 year old female who I previously saw in January and February 2023 for dysuria and urgency/frequency.  She also had microscopic hematuria and work-up was negative.  She was started on topical estrogen cream, worked on decreasing stress and avoiding bladder irritants, and her symptoms have almost completely resolved.  She really denies any urinary complaints today.  She has a pessary that is managed by GYN.  We reviewed the overlap between OAB, genitourinary syndrome of menopause, and interstitial cystitis, and I recommended continuing with the topical estrogen cream and behavioral strategies.  RTC 1 year, can follow-up as needed at that point if doing well  [place], [place] [address] [place], [place] [address] [phone]

## 2022-05-08 ENCOUNTER — Ambulatory Visit: Payer: Medicare Other | Admitting: Urology

## 2022-05-23 ENCOUNTER — Ambulatory Visit (INDEPENDENT_AMBULATORY_CARE_PROVIDER_SITE_OTHER): Payer: Medicare Other | Admitting: Internal Medicine

## 2022-05-23 ENCOUNTER — Encounter: Payer: Self-pay | Admitting: Internal Medicine

## 2022-05-23 VITALS — BP 124/78 | HR 67 | Temp 97.8°F | Ht 65.5 in | Wt 141.0 lb

## 2022-05-23 DIAGNOSIS — Z0001 Encounter for general adult medical examination with abnormal findings: Secondary | ICD-10-CM | POA: Diagnosis not present

## 2022-05-23 DIAGNOSIS — J069 Acute upper respiratory infection, unspecified: Secondary | ICD-10-CM | POA: Diagnosis not present

## 2022-05-23 DIAGNOSIS — K222 Esophageal obstruction: Secondary | ICD-10-CM | POA: Diagnosis not present

## 2022-05-23 DIAGNOSIS — E785 Hyperlipidemia, unspecified: Secondary | ICD-10-CM

## 2022-05-23 DIAGNOSIS — R7303 Prediabetes: Secondary | ICD-10-CM | POA: Diagnosis not present

## 2022-05-23 DIAGNOSIS — Z23 Encounter for immunization: Secondary | ICD-10-CM | POA: Diagnosis not present

## 2022-05-23 DIAGNOSIS — Z0000 Encounter for general adult medical examination without abnormal findings: Secondary | ICD-10-CM

## 2022-05-23 DIAGNOSIS — Z131 Encounter for screening for diabetes mellitus: Secondary | ICD-10-CM

## 2022-05-23 DIAGNOSIS — M818 Other osteoporosis without current pathological fracture: Secondary | ICD-10-CM | POA: Diagnosis not present

## 2022-05-23 MED ORDER — ATORVASTATIN CALCIUM 10 MG PO TABS: ORAL_TABLET | ORAL | 3 refills | Status: DC

## 2022-05-23 NOTE — Progress Notes (Signed)
Date:  05/23/2022   Name:  Valerie Raymond   DOB:  Sep 09, 1951   MRN:  [id number]   Chief Complaint: Annual Exam Valerie Raymond is a 71 y.o. female who presents today for her Complete Annual Exam. She feels poorly. She reports exercising/ training at the gym. She reports she is sleeping well. Breast complaints - none.  Mammogram: 03/2022 DEXA: 03/2018 Colonoscopy: 01/2015 repeat 10 yrs  Health Maintenance Due  Topic Date Due   INFLUENZA VACCINE  04/16/2022    Immunization History  Administered Date(s) Administered   Fluad Quad(high Dose 65+) 05/20/2019, 06/07/2020   Influenza-Unspecified 05/23/2015, 06/01/2018, 06/25/2021   Moderna Covid-19 Vaccine Bivalent Booster 16yr & up 06/04/2021   PFIZER(Purple Top)SARS-COV-2 Vaccination 11/11/2019, 12/07/2019, 06/19/2020, 02/13/2021   Pneumococcal Conjugate-13 03/17/2017   Pneumococcal Polysaccharide-23 03/23/2018   Tdap 03/13/2016   Zoster Recombinat (Shingrix) 08/31/2018, 11/18/2018   Zoster, Live 08/19/2011    Gastroesophageal Reflux She complains of dysphagia and heartburn. She reports no abdominal pain, no chest pain, no coughing or no wheezing. This is a chronic problem. The problem occurs occasionally. Pertinent negatives include no fatigue. She has tried a PPI for the symptoms.  Hyperlipidemia This is a chronic problem. The problem is controlled. Pertinent negatives include no chest pain or shortness of breath. Current antihyperlipidemic treatment includes statins. The current treatment provides significant improvement of lipids.  URI  This is a new problem. The current episode started in the past 7 days. The problem has been gradually improving. There has been no fever. Associated symptoms include congestion and headaches. Pertinent negatives include no abdominal pain, chest pain, coughing, diarrhea, dysuria, rash, vomiting or wheezing. She has tried decongestant and increased fluids (negative covid x 2 at home) for the symptoms. The  treatment provided moderate relief.    Lab Results  Component Value Date   NA 140 05/15/2021   K 4.3 05/15/2021   CO2 24 05/15/2021   GLUCOSE 94 05/15/2021   BUN 14 05/15/2021   CREATININE 0.82 05/15/2021   CALCIUM 10.3 05/15/2021   EGFR 77 05/15/2021   GFRNONAA 84 09/11/2020   Lab Results  Component Value Date   CHOL 154 05/15/2021   HDL 66 05/15/2021   LDLCALC 71 05/15/2021   TRIG 91 05/15/2021   CHOLHDL 2.3 05/15/2021   Lab Results  Component Value Date   TSH 1.220 04/06/2020   No results found for: "HGBA1C" Lab Results  Component Value Date   WBC 5.8 05/15/2021   HGB 12.5 05/15/2021   HCT 39.1 05/15/2021   MCV 85 05/15/2021   PLT 324 05/15/2021   Lab Results  Component Value Date   ALT 21 05/15/2021   AST 35 05/15/2021   ALKPHOS 109 05/15/2021   BILITOT 0.3 05/15/2021   Lab Results  Component Value Date   25OHVITD2 <1.0 08/04/2019   25OHVITD3 39 08/04/2019   VD25OH 50.0 09/11/2020     Review of Systems  Constitutional:  Negative for chills, fatigue and fever.  HENT:  Positive for congestion and sinus pressure. Negative for hearing loss, tinnitus, trouble swallowing and voice change.   Eyes:  Negative for visual disturbance.  Respiratory:  Negative for cough, chest tightness, shortness of breath and wheezing.   Cardiovascular:  Negative for chest pain, palpitations and leg swelling.  Gastrointestinal:  Positive for dysphagia and heartburn. Negative for abdominal pain, constipation, diarrhea and vomiting.  Endocrine: Negative for polydipsia and polyuria.  Genitourinary:  Negative for dysuria, frequency, genital sores, vaginal bleeding and  vaginal discharge.  Musculoskeletal:  Negative for arthralgias, gait problem and joint swelling.  Skin:  Negative for color change and rash.  Neurological:  Positive for headaches. Negative for dizziness, tremors and light-headedness.  Hematological:  Negative for adenopathy. Does not bruise/bleed easily.   Psychiatric/Behavioral:  Negative for dysphoric mood and sleep disturbance. The patient is not nervous/anxious.     Patient Active Problem List   Diagnosis Date Noted   Labral tear of hip, degenerative (Bilateral) 06/19/2020   Abnormal MRI, hip joint (Bilateral) (05/31/2020) 06/19/2020   Chronic sacroiliac joint pain (Right) 05/22/2020   Other spondylosis, sacral and sacrococcygeal region 04/25/2020   Chronic sacroiliac joint pain (Bilateral) (L>R) 04/11/2020   Enthesopathy of sacroiliac joint (Bilateral) 04/11/2020   Somatic dysfunction of sacroiliac joints (Bilateral) 04/11/2020   Osteoarthritis of hip (Left) 03/22/2020   Spondylosis without myelopathy or radiculopathy, lumbosacral region 09/28/2019   Trochanteric bursitis of hips (Bilateral) (L>R) 08/17/2019   Chronic thigh pain (1ry area of Pain) (Left) 08/04/2019   Lower extremity burning sensation (thigh) (Left) 08/04/2019   Greater trochanteric bursitis of hip (Left) 08/04/2019   Lumbar facet syndrome (Bilateral) (L>R) 08/04/2019   Chronic pain syndrome 08/03/2019   Problems influencing health status 08/03/2019   Abnormal MRI, lumbar spine (06/29/2019) 08/03/2019   Grade 1 Lumbar Retrolisthesis L3/L4 08/03/2019   DDD (degenerative disc disease), lumbosacral 08/03/2019   Lumbosacral intervertebral disc displacement (IVDD) 08/03/2019   Lumbar facet arthropathy (Multilevel) (Bilateral) 08/03/2019   Lumbosacral foraminal stenosis (Bilateral) (L5-S1) 08/03/2019   Lumbar lateral recess stenosis (Left) (L2-3) 08/03/2019   Chronic lower extremity pain (3ry area of Pain) (Bilateral) (L>R) 08/03/2019   Chronic low back pain (2ry area of Pain) (Bilateral) (L>R) w/o sciatica 08/03/2019   Chronic foot pain (Right) 08/03/2019   Chronic lumbar radiculitis 08/03/2019   Chronic musculoskeletal pain 08/03/2019   Hyperlipidemia, mild 05/22/2018   Lumbar radiculopathy 07/16/2017   Stricture and stenosis of esophagus    Lumbar pain with  radiation down legs (Bilateral) 06/05/2017   Dysphagia 03/21/2017   Tonsillith 10/09/2016   Family history of breast cancer in first degree relative 01/02/2016   Status post total abdominal hysterectomy and bilateral salpingo-oophorectomy (TAH-BSO) 01/02/2016   Vaginal atrophy 01/02/2016   Incomplete bladder emptying 01/02/2016   Midline cystocele 01/02/2016   Colon, diverticulosis 07/23/2015   Chronic hip pain (Bilateral) (L>R) 07/23/2015   Arthritis of knee, degenerative 07/23/2015   Other osteoporosis without current pathological fracture 07/23/2015   Avitaminosis D 07/23/2015    Allergies  Allergen Reactions   Lodine [Etodolac] Diarrhea and Nausea And Vomiting    Past Surgical History:  Procedure Laterality Date   ABDOMINAL HYSTERECTOMY  09/16/1990   CATARACT EXTRACTION W/PHACO Right 12/03/2021   Procedure: CATARACT EXTRACTION PHACO AND INTRAOCULAR LENS PLACEMENT (IOC) RIGHT 4.01 00:33.0;  Surgeon: Eulogio Bear, MD;  Location: [place];  Service: Ophthalmology;  Laterality: Right;   CATARACT EXTRACTION W/PHACO Left 12/17/2021   Procedure: CATARACT EXTRACTION PHACO AND INTRAOCULAR LENS PLACEMENT (IOC) LEFT 2.03 00:31.7;  Surgeon: Eulogio Bear, MD;  Location: [place];  Service: Ophthalmology;  Laterality: Left;   COLONOSCOPY  09/16/2004   COLONOSCOPY N/A 01/23/2015   Procedure: COLONOSCOPY;  Surgeon: Lucilla Lame, MD;  Location: [place];  Service: Gastroenterology;  Laterality: N/A;  cecum- [id number]   ESOPHAGEAL DILATION  04/21/2017   Procedure: ESOPHAGEAL DILATION;  Surgeon: Lucilla Lame, MD;  Location: [place];  Service: Endoscopy;;   ESOPHAGEAL DILATION  07/07/2017   Procedure: ESOPHAGEAL  DILATION;  Surgeon: Lucilla Lame, MD;  Location: [place];  Service: Endoscopy;;   ESOPHAGEAL DILATION N/A 07/28/2017   Procedure: ESOPHAGEAL DILATION;  Surgeon: Lucilla Lame, MD;  Location: [place];  Service:  Endoscopy;  Laterality: N/A;   ESOPHAGOGASTRODUODENOSCOPY N/A 04/21/2017   Procedure: ESOPHAGOGASTRODUODENOSCOPY (EGD);  Surgeon: Lucilla Lame, MD;  Location: [place];  Service: Endoscopy;  Laterality: N/A;   ESOPHAGOGASTRODUODENOSCOPY (EGD) WITH PROPOFOL N/A 07/07/2017   Procedure: ESOPHAGOGASTRODUODENOSCOPY (EGD) WITH PROPOFOL;  Surgeon: Lucilla Lame, MD;  Location: [place];  Service: Endoscopy;  Laterality: N/A;   ESOPHAGOGASTRODUODENOSCOPY (EGD) WITH PROPOFOL N/A 07/28/2017   Procedure: ESOPHAGOGASTRODUODENOSCOPY (EGD) WITH PROPOFOL;  Surgeon: Lucilla Lame, MD;  Location: [place];  Service: Endoscopy;  Laterality: N/A;   SALPINGOOPHORECTOMY Bilateral    TOTAL HIP ARTHROPLASTY Left 09/2020    Social History   Tobacco Use   Smoking status: Never    Passive exposure: Never   Smokeless tobacco: Never   Tobacco comments:    smoking cessation materials not required  Vaping Use   Vaping Use: Never used  Substance Use Topics   Alcohol use: No    Alcohol/week: 0.0 standard drinks of alcohol   Drug use: No     Medication list has been reviewed and updated.  Current Meds  Medication Sig   ARTIFICIAL TEARS 1 % ophthalmic solution Place 1 drop into both eyes daily as needed (Dry eye).    atorvastatin (LIPITOR) 10 MG tablet TAKE 1 TABLET(10 MG) BY MOUTH DAILY   Calcium Carbonate-Vit D-Min (CALCIUM 1200 PO) Take 1,200 mg by mouth daily.    Cholecalciferol (VITAMIN D3) 50 MCG (2000 UT) capsule Take 2,000 Units by mouth daily. am   co-enzyme Q-10 30 MG capsule Take 30 mg by mouth daily.   conjugated estrogens (PREMARIN) vaginal cream PLACE VAGINALLY 2 TIMES A WEEK   CRANBERRY PO Take by mouth.   meloxicam (MOBIC) 15 MG tablet TAKE 1 TABLET(15 MG) BY MOUTH DAILY AS NEEDED FOR PAIN   mometasone (ELOCON) 0.1 % cream Apply 1 application topically daily.   pantoprazole (PROTONIX) 40 MG tablet TAKE 1 TABLET(40 MG) BY MOUTH DAILY IN THE MORNING        05/23/2022    8:17 AM 10/01/2021    1:24 PM 05/15/2021    9:21 AM 09/11/2020   10:19 AM  GAD 7 : Generalized Anxiety Score  Nervous, Anxious, on Edge 0 0 0 0  Control/stop worrying 0 0 0 0  Worry too much - different things 0 0 0 0  Trouble relaxing 0 0 0 0  Restless 0 0 0 0  Easily annoyed or irritable 0 0 0 0  Afraid - awful might happen 0 0 0 0  Total GAD 7 Score 0 0 0 0  Anxiety Difficulty Not difficult at all Not difficult at all         05/23/2022    8:17 AM 04/30/2022    8:12 AM 10/01/2021    1:23 PM  Depression screen PHQ 2/9  Decreased Interest 0 0 0  Down, Depressed, Hopeless 0 0 0  PHQ - 2 Score 0 0 0  Altered sleeping 0  0  Tired, decreased energy 0  0  Change in appetite 0  0  Feeling bad or failure about yourself  0  0  Trouble concentrating 0  0  Moving slowly or fidgety/restless 0  0  Suicidal thoughts 0  0  PHQ-9 Score 0  0  Difficult doing work/chores Not difficult at all  Not difficult at all    BP Readings from Last 3 Encounters:  05/23/22 124/78  05/07/22 (!) 163/81  05/03/22 (!) 159/70    Physical Exam Vitals and nursing note reviewed.  Constitutional:      General: She is not in acute distress.    Appearance: She is well-developed.  HENT:     Head: Normocephalic and atraumatic.     Right Ear: Tympanic membrane and ear canal normal.     Left Ear: Tympanic membrane and ear canal normal.     Nose:     Right Sinus: No maxillary sinus tenderness.     Left Sinus: No maxillary sinus tenderness.     Mouth/Throat:     Pharynx: No oropharyngeal exudate or posterior oropharyngeal erythema.  Eyes:     General: No scleral icterus.       Right eye: No discharge.        Left eye: No discharge.     Conjunctiva/sclera: Conjunctivae normal.  Neck:     Thyroid: No thyromegaly.     Vascular: No carotid bruit.  Cardiovascular:     Rate and Rhythm: Normal rate and regular rhythm.     Pulses: Normal pulses.     Heart sounds: Normal heart sounds.   Pulmonary:     Effort: Pulmonary effort is normal. No respiratory distress.     Breath sounds: No wheezing.  Chest:  Breasts:    Right: No mass, nipple discharge, skin change or tenderness.     Left: No mass, nipple discharge, skin change or tenderness.  Abdominal:     General: Bowel sounds are normal.     Palpations: Abdomen is soft.     Tenderness: There is no abdominal tenderness.  Musculoskeletal:        General: Normal range of motion.     Cervical back: Normal range of motion. No erythema.     Right lower leg: No edema.     Left lower leg: No edema.  Lymphadenopathy:     Cervical: No cervical adenopathy.  Skin:    General: Skin is warm and dry.     Capillary Refill: Capillary refill takes less than 2 seconds.     Findings: No rash.  Neurological:     General: No focal deficit present.     Mental Status: She is alert and oriented to person, place, and time.     Cranial Nerves: No cranial nerve deficit.     Sensory: No sensory deficit.     Deep Tendon Reflexes: Reflexes are normal and symmetric.  Psychiatric:        Attention and Perception: Attention normal.        Mood and Affect: Mood normal.     Wt Readings from Last 3 Encounters:  05/23/22 141 lb (64 kg)  05/07/22 143 lb (64.9 kg)  05/03/22 144 lb 6.4 oz (65.5 kg)    BP 124/78 (BP Location: Right Arm, Cuff Size: Normal)   Pulse 67   Temp 97.8 F (36.6 C) (Oral)   Ht 5' 5.5" (1.664 m)   Wt 141 lb (64 kg)   SpO2 98%   BMI 23.11 kg/m   Assessment and Plan: 1. Annual physical exam Normal exam  Up to date on screenings and immunizations. Flu vaccine today  2. Screening for diabetes mellitus - Hemoglobin A1c  3. Hyperlipidemia, mild Tolerating statin medication without side effects at this time LDL is at goal of <  70 on current dose Continue same therapy without change at this time. - Lipid panel - TSH - atorvastatin (LIPITOR) 10 MG tablet; TAKE 1 TABLET(10 MG) BY MOUTH DAILY  Dispense: 90 tablet;  Refill: 3  4. Stricture and stenosis of esophagus Recommend PPI indefinitely - CBC with Differential/Platelet - Comprehensive metabolic panel  5. Other osteoporosis without current pathological fracture Continue calcium and vitamin D S/p alendronate therapy - DG Bone Density  6. Viral upper respiratory tract infection Continue supportive care Follow up if worsening   Partially dictated using Editor, commissioning. Any errors are unintentional.  Halina Maidens, MD [facility]  05/23/2022

## 2022-05-24 LAB — COMPREHENSIVE METABOLIC PANEL
ALT: 21 IU/L (ref 0–32)
AST: 26 IU/L (ref 0–40)
Albumin/Globulin Ratio: 1.8 (ref 1.2–2.2)
Albumin: 4.7 g/dL (ref 3.9–4.9)
Alkaline Phosphatase: 89 IU/L (ref 44–121)
BUN/Creatinine Ratio: 18 (ref 12–28)
BUN: 14 mg/dL (ref 8–27)
Bilirubin Total: 0.4 mg/dL (ref 0.0–1.2)
CO2: 22 mmol/L (ref 20–29)
Calcium: 10.3 mg/dL (ref 8.7–10.3)
Chloride: 101 mmol/L (ref 96–106)
Creatinine, Ser: 0.77 mg/dL (ref 0.57–1.00)
Globulin, Total: 2.6 g/dL (ref 1.5–4.5)
Glucose: 90 mg/dL (ref 70–99)
Potassium: 4.5 mmol/L (ref 3.5–5.2)
Sodium: 140 mmol/L (ref 134–144)
Total Protein: 7.3 g/dL (ref 6.0–8.5)
eGFR: 83 mL/min/{1.73_m2} (ref >59)

## 2022-05-24 LAB — CBC WITH DIFFERENTIAL/PLATELET
Basophils Absolute: 0 10*3/uL (ref 0.0–0.2)
Basos: 1 %
EOS (ABSOLUTE): 0.1 10*3/uL (ref 0.0–0.4)
Eos: 2 %
Hematocrit: 39.2 % (ref 34.0–46.6)
Hemoglobin: 13.6 g/dL (ref 11.1–15.9)
Immature Grans (Abs): 0 10*3/uL (ref 0.0–0.1)
Immature Granulocytes: 0 %
Lymphocytes Absolute: 0.9 10*3/uL (ref 0.7–3.1)
Lymphs: 15 %
MCH: 30.4 pg (ref 26.6–33.0)
MCHC: 34.7 g/dL (ref 31.5–35.7)
MCV: 88 fL (ref 79–97)
Monocytes Absolute: 0.4 10*3/uL (ref 0.1–0.9)
Monocytes: 7 %
Neutrophils Absolute: 4.8 10*3/uL (ref 1.4–7.0)
Neutrophils: 75 %
Platelets: 246 10*3/uL (ref 150–450)
RBC: 4.47 x10E6/uL (ref 3.77–5.28)
RDW: 12.9 % (ref 11.7–15.4)
WBC: 6.3 10*3/uL (ref 3.4–10.8)

## 2022-05-24 LAB — LIPID PANEL
Chol/HDL Ratio: 2.9 ratio (ref 0.0–4.4)
Cholesterol, Total: 138 mg/dL (ref 100–199)
HDL: 47 mg/dL (ref >39)
LDL Chol Calc (NIH): 65 mg/dL (ref 0–99)
Triglycerides: 150 mg/dL — ABNORMAL HIGH (ref 0–149)
VLDL Cholesterol Cal: 26 mg/dL (ref 5–40)

## 2022-05-24 LAB — HEMOGLOBIN A1C
Est. average glucose Bld gHb Est-mCnc: 123 mg/dL
Hgb A1c MFr Bld: 5.9 % — ABNORMAL HIGH (ref 4.8–5.6)

## 2022-05-24 LAB — TSH: TSH: 2.14 u[IU]/mL (ref 0.450–4.500)

## 2022-06-13 ENCOUNTER — Ambulatory Visit
Admission: RE | Admit: 2022-06-13 | Discharge: 2022-06-13 | Disposition: A | Discharge status: Home / Self Care | Payer: Medicare Other | Source: Ambulatory Visit | Attending: Internal Medicine | Admitting: Internal Medicine

## 2022-06-13 DIAGNOSIS — M85851 Other specified disorders of bone density and structure, right thigh: Secondary | ICD-10-CM | POA: Diagnosis not present

## 2022-06-13 DIAGNOSIS — M818 Other osteoporosis without current pathological fracture: Secondary | ICD-10-CM | POA: Diagnosis not present

## 2022-08-12 DIAGNOSIS — L738 Other specified follicular disorders: Secondary | ICD-10-CM | POA: Diagnosis not present

## 2022-08-12 DIAGNOSIS — L300 Nummular dermatitis: Secondary | ICD-10-CM | POA: Diagnosis not present

## 2022-08-12 DIAGNOSIS — D2262 Melanocytic nevi of left upper limb, including shoulder: Secondary | ICD-10-CM | POA: Diagnosis not present

## 2022-08-12 DIAGNOSIS — Z86018 Personal history of other benign neoplasm: Secondary | ICD-10-CM | POA: Diagnosis not present

## 2022-08-12 DIAGNOSIS — L578 Other skin changes due to chronic exposure to nonionizing radiation: Secondary | ICD-10-CM | POA: Diagnosis not present

## 2022-08-12 DIAGNOSIS — D485 Neoplasm of uncertain behavior of skin: Secondary | ICD-10-CM | POA: Diagnosis not present

## 2022-08-12 DIAGNOSIS — D2272 Melanocytic nevi of left lower limb, including hip: Secondary | ICD-10-CM | POA: Diagnosis not present

## 2022-08-19 ENCOUNTER — Other Ambulatory Visit: Payer: Self-pay | Admitting: Internal Medicine

## 2022-08-19 DIAGNOSIS — M5136 Other intervertebral disc degeneration, lumbar region: Secondary | ICD-10-CM

## 2022-09-30 ENCOUNTER — Other Ambulatory Visit: Payer: Self-pay | Admitting: Obstetrics and Gynecology

## 2022-11-19 ENCOUNTER — Other Ambulatory Visit: Payer: Self-pay | Admitting: Internal Medicine

## 2022-11-19 MED ORDER — PANTOPRAZOLE SODIUM 40 MG PO TBEC: DELAYED_RELEASE_TABLET | ORAL | 2 refills | Status: DC

## 2022-11-19 NOTE — Telephone Encounter (Signed)
[facility] was suppose to transfer pts Refills to [place] store/ pt called [facility] to refill pantoprazole (PROTONIX) 40 MG tablet and they have lost the transfer / [facility] no longer has anything on pts profile / so [facility] asked pt to have office sen a new RX to them for refills   Please send [place], [place]

## 2022-11-19 NOTE — Telephone Encounter (Signed)
Requested Prescriptions  Pending Prescriptions Disp Refills   pantoprazole (PROTONIX) 40 MG tablet 90 tablet 2    Sig: TAKE 1 TABLET(40 MG) BY MOUTH DAILY IN THE MORNING     Gastroenterology: Proton Pump Inhibitors Passed - 11/19/2022 10:29 AM      Passed - Valid encounter within last 12 months    Recent Outpatient Visits           6 months ago Annual physical exam   [facility], Jesse Sans, MD   1 year ago [facility], Jesse Sans, MD   1 year ago Annual physical exam   [facility], Jesse Sans, MD   2 years ago Hyperlipidemia, mild   [facility], Jesse Sans, MD   2 years ago Elevated BP without diagnosis of hypertension   [facility] [place], Jesse Sans, MD       Future Appointments             In 5 months [facility] [place], Herbert Seta, MD [facility] [place]   In 6 months [place] Mel[facility] Jesse Sans, MD [place] [facility], [facility]

## 2022-12-26 ENCOUNTER — Other Ambulatory Visit: Payer: Self-pay | Admitting: Internal Medicine

## 2022-12-26 DIAGNOSIS — E785 Hyperlipidemia, unspecified: Secondary | ICD-10-CM

## 2022-12-26 DIAGNOSIS — M5136 Other intervertebral disc degeneration, lumbar region: Secondary | ICD-10-CM

## 2022-12-26 MED ORDER — MELOXICAM 15 MG PO TABS
ORAL_TABLET | ORAL | 0 refills | Status: AC
Start: 2022-12-26 — End: ?

## 2022-12-26 NOTE — Telephone Encounter (Signed)
Requested Prescriptions  Pending Prescriptions Disp Refills   meloxicam (MOBIC) 15 MG tablet 90 tablet 0    Sig: TAKE 1 TABLET(15 MG) BY MOUTH DAILY AS NEEDED FOR PAIN     Analgesics:  COX2 Inhibitors Failed - 12/26/2022  9:36 AM      Failed - Manual Review: Labs are only required if the patient has taken medication for more than 8 weeks.      Passed - HGB in normal range and within 360 days    Hemoglobin  Date Value Ref Range Status  05/23/2022 13.6 11.1 - 15.9 g/dL Final         Passed - Cr in normal range and within 360 days    Creatinine, Ser  Date Value Ref Range Status  05/23/2022 0.77 0.57 - 1.00 mg/dL Final         Passed - HCT in normal range and within 360 days    Hematocrit  Date Value Ref Range Status  05/23/2022 39.2 34.0 - 46.6 % Final         Passed - AST in normal range and within 360 days    AST  Date Value Ref Range Status  05/23/2022 26 0 - 40 IU/L Final         Passed - ALT in normal range and within 360 days    ALT  Date Value Ref Range Status  05/23/2022 21 0 - 32 IU/L Final         Passed - eGFR is 30 or above and within 360 days    GFR calc Af Amer  Date Value Ref Range Status  09/11/2020 97 >59 mL/min/1.73 Final    Comment:    **In accordance with recommendations from the NKF-ASN Task force,**   [facility] is in the process of updating its eGFR calculation to the   2021 CKD-EPI creatinine equation that estimates kidney function   without a race variable.    GFR, Estimated  Date Value Ref Range Status  08/09/2020 >60 >60 mL/min Final    Comment:    (NOTE) Calculated using the CKD-EPI Creatinine Equation (2021)    GFR calc non Af Amer  Date Value Ref Range Status  09/11/2020 84 >59 mL/min/1.73 Final   eGFR  Date Value Ref Range Status  05/23/2022 83 >59 mL/min/1.73 Final         Passed - Patient is not pregnant      Passed - Valid encounter within last 12 months    Recent Outpatient Visits           7 months ago Annual  physical exam   [facility], Nyoka Cowden, MD   1 year ago Dysuria   [facility] at [facility], Nyoka Cowden, MD   1 year ago Annual physical exam   [facility] at [facility], Nyoka Cowden, MD   2 years ago Hyperlipidemia, mild   [place] [facility] at [facility], Nyoka Cowden, MD   2 years ago Elevated BP without diagnosis of hypertension   [facility] at [facility], Nyoka Cowden, MD       Future Appointments             In 4 months Richardo Hanks Laurette Schimke, MD [facility] [place]   In 5 months Reubin Milan,  MD [facility] at [facility]            Refused Prescriptions Disp Refills   atorvastatin (LIPITOR) 10 MG tablet 90 tablet 3    Sig: TAKE 1 TABLET(10 MG) BY MOUTH DAILY     Cardiovascular:  Antilipid - Statins Failed - 12/26/2022  9:36 AM      Failed - Lipid Panel in normal range within the last 12 months    Cholesterol, Total  Date Value Ref Range Status  05/23/2022 138 100 - 199 mg/dL Final   LDL Chol Calc (NIH)  Date Value Ref Range Status  05/23/2022 65 0 - 99 mg/dL Final   HDL  Date Value Ref Range Status  05/23/2022 47 >39 mg/dL Final   Triglycerides  Date Value Ref Range Status  05/23/2022 150 (H) 0 - 149 mg/dL Final         Passed - Patient is not pregnant      Passed - Valid encounter within last 12 months    Recent Outpatient Visits           7 months ago Annual physical exam   [place] at [facility], Nyoka Cowden, MD   1 year ago Dysuria   [facility] at [facility], Nyoka Cowden, MD   1 year ago Annual physical exam   [facility] at [facility], Nyoka Cowden, MD   2  years ago Hyperlipidemia, mild   [place] [facility] at [facility], Nyoka Cowden, MD   2 years ago Elevated BP without diagnosis of hypertension   [facility] at [facility], Nyoka Cowden, MD       Future Appointments             In 4 months Richardo Hanks, Laurette Schimke, MD [facility] [place]   In 5 months Judithann Graves Nyoka Cowden, MD [facility] at [facility], [facility]

## 2022-12-26 NOTE — Telephone Encounter (Signed)
Medication Refill - Medication: atorvastatin (LIPITOR) 10 MG tablet  and meloxicam (MOBIC) 15 MG tablet  Has the patient contacted their pharmacy? Yes.   [facility] sent the script to [facility] and somewhere in the transfer the script was lost  Preferred Pharmacy (with phone number or street name):  [facility] - [place], [place] - [address] Phone: [phone]  Fax: [phone]     Has the patient been seen for an appointment in the last year OR does the patient have an upcoming appointment? No.  Agent: Please be advised that RX refills may take up to 3 business days. We ask that you follow-up with your pharmacy.

## 2023-01-10 DIAGNOSIS — Z961 Presence of intraocular lens: Secondary | ICD-10-CM | POA: Diagnosis not present

## 2023-01-10 DIAGNOSIS — H43813 Vitreous degeneration, bilateral: Secondary | ICD-10-CM | POA: Diagnosis not present

## 2023-02-25 DIAGNOSIS — L738 Other specified follicular disorders: Secondary | ICD-10-CM | POA: Diagnosis not present

## 2023-02-25 DIAGNOSIS — Z86018 Personal history of other benign neoplasm: Secondary | ICD-10-CM | POA: Diagnosis not present

## 2023-02-25 DIAGNOSIS — L300 Nummular dermatitis: Secondary | ICD-10-CM | POA: Diagnosis not present

## 2023-02-25 DIAGNOSIS — L578 Other skin changes due to chronic exposure to nonionizing radiation: Secondary | ICD-10-CM | POA: Diagnosis not present

## 2023-03-03 ENCOUNTER — Other Ambulatory Visit: Payer: Self-pay | Admitting: Internal Medicine

## 2023-03-03 DIAGNOSIS — Z1231 Encounter for screening mammogram for malignant neoplasm of breast: Secondary | ICD-10-CM

## 2023-04-21 ENCOUNTER — Ambulatory Visit
Admission: RE | Admit: 2023-04-21 | Discharge: 2023-04-21 | Disposition: A | Discharge status: Home / Self Care | Payer: Medicare Other | Source: Ambulatory Visit | Attending: Internal Medicine | Admitting: Internal Medicine

## 2023-04-21 DIAGNOSIS — Z1231 Encounter for screening mammogram for malignant neoplasm of breast: Secondary | ICD-10-CM | POA: Diagnosis not present

## 2023-05-06 ENCOUNTER — Ambulatory Visit: Payer: Medicare Other | Admitting: Urology

## 2023-05-06 ENCOUNTER — Ambulatory Visit: Payer: Medicare Other | Admitting: Obstetrics and Gynecology

## 2023-05-06 NOTE — Progress Notes (Unsigned)
    GYNECOLOGY PROGRESS NOTE  Subjective:    Patient ID: Valerie Raymond, female    DOB: 04/26/1951, 72 y.o.   MRN: [id number]  HPI  Patient is a 72 y.o. G0P0 female who presents for Pessary Maintenance. She currently has yearly pessary checks as she is able to perform self-maintenance. Removes and cleans pessary daily. She has a h/o cysttocele, incomplete bladder emptying, remote h/o hysterectomy, and vaginal atrophy.  Currently using Premarin cream for pessary maintenance. She reports that she has no concerns with her pessary. Also has a h/o interstitial cystitis, has a flare several months ago, also had a UTI. Currently denies symptoms.    The following portions of the patient's history were reviewed and updated as appropriate:    She  has a past medical history of Arthralgia of hip (07/23/2015), Arthritis, Degenerative disc disease, lumbar, DJD (degenerative joint disease), Dysphagia, Endometriosis, GERD (gastroesophageal reflux disease), Heartburn, Joint pain, Osteoporosis, Pelvic pressure in female (01/02/2016), Rash, and Vitamin D deficiency.   She  has a past surgical history that includes Colonoscopy (09/16/2004); Colonoscopy (N/A, 01/23/2015); Abdominal hysterectomy (09/16/1990); Salpingoophorectomy (Bilateral); Esophagogastroduodenoscopy (N/A, 04/21/2017); Esophageal dilation (04/21/2017); Esophagogastroduodenoscopy (egd) with propofol (N/A, 07/07/2017); Esophageal dilation (07/07/2017); Esophagogastroduodenoscopy (egd) with propofol (N/A, 07/28/2017); Esophageal dilation (N/A, 07/28/2017); Total hip arthroplasty (Left, 09/2020); Cataract extraction w/PHACO (Right, 12/03/2021); and Cataract extraction w/PHACO (Left, 12/17/2021).   Her family history includes Breast cancer in her cousin and maternal aunt; Breast cancer (age of onset: 77) in her sister; Breast cancer (age of onset: 48) in her sister; Colon cancer in her paternal uncle; Pancreatic cancer in her mother; Prostate cancer in her  brother and father.       {Common ambulatory SmartLinks:19316}  Review of Systems {ros; complete:30496}   Objective:   There were no vitals taken for this visit. There is no height or weight on file to calculate BMI. General appearance: {general exam:16600} Abdomen: {abdominal exam:16834} Pelvic: {pelvic exam:16852::"cervix normal in appearance","external genitalia normal","no adnexal masses or tenderness","no cervical motion tenderness","rectovaginal septum normal","uterus normal size, shape, and consistency","vagina normal without discharge"} Extremities: {extremity exam:5109} Neurologic: {neuro exam:17854}   Assessment:   No diagnosis found.   Plan:   There are no diagnoses linked to this encounter.    Hildred Laser, MD [facility]

## 2023-05-06 NOTE — Patient Instructions (Signed)
How to Use a Vaginal Pessary  A vaginal pessary is a removable device that is placed into your vagina to support pelvic organs that droop. These organs include your uterus, bladder, and rectum. When your pelvic organs drop down into your vagina, it causes a condition called pelvic organ prolapse (POP). A pessary may be an alternative to surgery for women with POP. It may help women who leak urine when they strain or exercise (stress incontinence). This is a symptom of POP. A vaginal pessary may also be a temporary treatment for stress incontinence during pregnancy. There are several types of pessaries. All types are usually made of silicone. You can insert and remove some on your own. Other types must be inserted and removed by your health care provider at office visits. The reason you are using a pessary and the severity of your condition will determine which one is best for you. It is also important to find the right size. A pessary that is too small may fall out. A pessary that is too large may cause pain or discomfort. Your health care provider will do a physical exam to find the correct size and fit for your pessary. It may take several appointments to find the best fit for you. If you can be fit with the type of pessary that you can insert, remove, and clean yourself, your health care provider will teach you how to use your pessary at home. You may have checkups every few months. If you have the type of pessary that needs to be inserted and removed by your health care provider, you will have appointments every few months to have the pessary removed, cleaned, and replaced. What are the risks? When properly fitted and cared for, risks of using a vaginal pessary can be small. However, there can be problems that may include: Vaginal discharge. Vaginal bleeding. A bad smell coming from your vagina. Scraping of the skin inside your vagina. How to use your pessary Follow your health care provider's  instructions for using a pessary. These instructions may vary, depending on the type of pessary you have. To insert a pessary: Wash your hands with soap and water for at least 20 seconds. Squeeze or fold the pessary in half and lubricate the tip with a water-based lubricant. Insert the pessary into your vagina. It will unfold and provide support. To remove the pessary, gently tug it out of your vagina. You can remove the pessary every night or after several days. You can also remove it to have sex. How to care for your pessary If you have a pessary that you can remove: Clean your pessary with soap and water. Rinse well. Dry it completely before inserting it back into your vagina. Follow these instructions at home: Take over-the-counter and prescription medicines only as told by your health care provider. Your health care provider may prescribe an estrogen cream to moisten your vagina. Keep all follow-up visits. This is important. Contact a health care provider if: You feel any pain or discomfort when your pessary is in place. You continue to have stress incontinence. You have trouble keeping your pessary from falling out. You have an unusual vaginal discharge that is blood-tinged or smells bad. Summary A vaginal pessary is a removable device that is placed into your vagina to support pelvic organs that droop. This condition is called pelvic organ prolapse (POP). There are several types of pessaries. Some you can insert and remove on your own. Others must be inserted and  removed by your health care provider. The best type for you depends on the reason you are using a pessary and the severity of your condition. It is also important to find the right size. If you can use the type that you insert and remove on your own, your health care provider will teach you how to use it and schedule checkups every few months. If you have the type that needs to be inserted and removed by your health care  provider, you will have regular appointments to have your pessary removed, cleaned, and replaced. This information is not intended to replace advice given to you by your health care provider. Make sure you discuss any questions you have with your health care provider. Document Revised: 03/02/2020 Document Reviewed: 03/02/2020 Elsevier Patient Education  2024 [employer].

## 2023-05-07 ENCOUNTER — Encounter: Payer: Self-pay | Admitting: Obstetrics and Gynecology

## 2023-05-07 ENCOUNTER — Ambulatory Visit (INDEPENDENT_AMBULATORY_CARE_PROVIDER_SITE_OTHER): Payer: Medicare Other

## 2023-05-07 ENCOUNTER — Ambulatory Visit: Payer: Medicare Other | Admitting: Obstetrics and Gynecology

## 2023-05-07 VITALS — BP 142/73 | HR 68 | Resp 16 | Ht 66.0 in | Wt 137.2 lb

## 2023-05-07 VITALS — BP 130/70 | Ht 65.5 in | Wt 138.4 lb

## 2023-05-07 DIAGNOSIS — N3010 Interstitial cystitis (chronic) without hematuria: Secondary | ICD-10-CM

## 2023-05-07 DIAGNOSIS — N952 Postmenopausal atrophic vaginitis: Secondary | ICD-10-CM

## 2023-05-07 DIAGNOSIS — N8111 Cystocele, midline: Secondary | ICD-10-CM

## 2023-05-07 DIAGNOSIS — Z0000 Encounter for general adult medical examination without abnormal findings: Secondary | ICD-10-CM

## 2023-05-07 DIAGNOSIS — Z4689 Encounter for fitting and adjustment of other specified devices: Secondary | ICD-10-CM | POA: Diagnosis not present

## 2023-05-07 NOTE — Patient Instructions (Addendum)
Valerie Raymond , Thank you for taking time to come for your Medicare Wellness Visit. I appreciate your ongoing commitment to your health goals. Please review the following plan we discussed and let me know if I can assist you in the future.   Referrals/Orders/Follow-Ups/Clinician Recommendations: none  This is a list of the screening recommended for you and due dates:  Health Maintenance  Topic Date Due   COVID-19 Vaccine (6 - 2023-24 season) 05/17/2022   Flu Shot  04/17/2023   Mammogram  04/20/2024   Medicare Annual Wellness Visit  05/06/2024   Colon Cancer Screening  01/22/2025   DTaP/Tdap/Td vaccine (2 - Td or Tdap) 03/13/2026   Pneumonia Vaccine  Completed   DEXA scan (bone density measurement)  Completed   Hepatitis C Screening  Completed   Zoster (Shingles) Vaccine  Completed   HPV Vaccine  Aged Out    Advanced directives: (ACP Link)Information on Advanced Care Planning can be found at [facility] Advance Health Care Directives Advance Health Care Directives ([email or web address]   Next Medicare Annual Wellness Visit scheduled for next year: Yes   05/12/24 @ 8:15 am in person

## 2023-05-07 NOTE — Progress Notes (Signed)
Subjective:   Valerie Raymond is a 72 y.o. female who presents for Medicare Annual (Subsequent) preventive examination.  Visit Complete: In person  Review of Systems     Cardiac Risk Factors include: advanced age (>92men, >77 women);dyslipidemia     Objective:    Today's Vitals   05/07/23 0807  BP: 130/70  Weight: 138 lb 6.4 oz (62.8 kg)  Height: 5' 5.5" (1.664 m)   Body mass index is 22.68 kg/m.     05/07/2023    8:15 AM 12/17/2021    7:30 AM 12/03/2021   10:04 AM 04/23/2021    8:23 AM 08/09/2020    8:15 AM 06/19/2020    8:33 AM 05/11/2020    1:44 PM  Advanced Directives  Does Patient Have a Medical Advance Directive? No No No No No No No  Would patient like information on creating a medical advance directive? No - Patient declined No - Patient declined No - Patient declined No - Patient declined No - Patient declined No - Patient declined     Current Medications (verified) Outpatient Encounter Medications as of 05/07/2023  Medication Sig   ARTIFICIAL TEARS 1 % ophthalmic solution Place 1 drop into both eyes daily as needed (Dry eye).    atorvastatin (LIPITOR) 10 MG tablet TAKE 1 TABLET(10 MG) BY MOUTH DAILY   Calcium Carbonate-Vit D-Min (CALCIUM 1200 PO) Take 1,200 mg by mouth daily.    Cholecalciferol (VITAMIN D3) 50 MCG (2000 UT) capsule Take 2,000 Units by mouth daily. am   co-enzyme Q-10 30 MG capsule Take 30 mg by mouth daily.   conjugated estrogens (PREMARIN) vaginal cream PLACE VAGINALLY 2 TIMES A WEEK   CRANBERRY PO Take by mouth.   meloxicam (MOBIC) 15 MG tablet TAKE 1 TABLET(15 MG) BY MOUTH DAILY AS NEEDED FOR PAIN   pantoprazole (PROTONIX) 40 MG tablet TAKE 1 TABLET(40 MG) BY MOUTH DAILY IN THE MORNING   mometasone (ELOCON) 0.1 % cream Apply 1 application topically daily. (Patient not taking: Reported on 05/07/2023)   No facility-administered encounter medications on file as of 05/07/2023.    Allergies (verified) Lodine [etodolac]   History: Past Medical  History:  Diagnosis Date   Arthralgia of hip 07/23/2015   Arthritis    knee,hip,hands   Degenerative disc disease, lumbar    DJD (degenerative joint disease)    Dysphagia    with solids and regurgitation   Endometriosis    h/o   GERD (gastroesophageal reflux disease)    occasional   Heartburn    Joint pain    Osteoporosis    hip   Pelvic pressure in female 01/02/2016   Rash    skin rash and itching   Vitamin D deficiency    Past Surgical History:  Procedure Laterality Date   ABDOMINAL HYSTERECTOMY  09/16/1990   CATARACT EXTRACTION W/PHACO Right 12/03/2021   Procedure: CATARACT EXTRACTION PHACO AND INTRAOCULAR LENS PLACEMENT (IOC) RIGHT 4.01 00:33.0;  Surgeon: Nevada Crane, MD;  Location: [facility];  Service: Ophthalmology;  Laterality: Right;   CATARACT EXTRACTION W/PHACO Left 12/17/2021   Procedure: CATARACT EXTRACTION PHACO AND INTRAOCULAR LENS PLACEMENT (IOC) LEFT 2.03 00:31.7;  Surgeon: Nevada Crane, MD;  Location: [facility];  Service: Ophthalmology;  Laterality: Left;   COLONOSCOPY  09/16/2004   COLONOSCOPY N/A 01/23/2015   Procedure: COLONOSCOPY;  Surgeon: Midge Minium, MD;  Location: [facility];  Service: Gastroenterology;  Laterality: N/A;  cecum- [id number]   ESOPHAGEAL DILATION  04/21/2017  Procedure: ESOPHAGEAL DILATION;  Surgeon: Midge Minium, MD;  Location: [facility];  Service: Endoscopy;;   ESOPHAGEAL DILATION  07/07/2017   Procedure: ESOPHAGEAL DILATION;  Surgeon: Midge Minium, MD;  Location[facility];  Service: Endoscopy;;   ESOPHAGEAL DILATION N/A 07/28/2017   Procedure: ESOPHAGEAL DILATION;  Surgeon: Midge Minium, MD;  Location: [facility];  Service: Endoscopy;  Laterality: N/A;   ESOPHAGOGASTRODUODENOSCOPY N/A 04/21/2017   Procedure: ESOPHAGOGASTRODUODENOSCOPY (EGD);  Surgeon: Midge Minium, MD;  Location: [facility];  Service: Endoscopy;  Laterality: N/A;   ESOPHAGOGASTRODUODENOSCOPY (EGD)  WITH PROPOFOL N/A 07/07/2017   Procedure: ESOPHAGOGASTRODUODENOSCOPY (EGD) WITH PROPOFOL;  Surgeon: Midge Minium, MD;  Location: [facility];  Service: Endoscopy;  Laterality: N/A;   ESOPHAGOGASTRODUODENOSCOPY (EGD) WITH PROPOFOL N/A 07/28/2017   Procedure: ESOPHAGOGASTRODUODENOSCOPY (EGD) WITH PROPOFOL;  Surgeon: Midge Minium, MD;  Location: [facility];  Service: Endoscopy;  Laterality: N/A;   SALPINGOOPHORECTOMY Bilateral    TOTAL HIP ARTHROPLASTY Left 09/2020   Family History  Problem Relation Age of Onset   Pancreatic cancer Mother    Breast cancer Sister 48   Prostate cancer Father    Breast cancer Maternal Aunt    Breast cancer Cousin        pat cousin   Breast cancer Sister 74   Colon cancer Paternal Uncle    Prostate cancer Brother    Ovarian cancer Neg Hx    Diabetes Neg Hx    Social History   Socioeconomic History   Marital status: Single    Spouse name: Not on file   Number of children: 0   Years of education: Not on file   Highest education level: Associate degree: academic program  Occupational History    Employer: [employer]  Tobacco Use   Smoking status: Never    Passive exposure: Never   Smokeless tobacco: Never   Tobacco comments:    smoking cessation materials not required  Vaping Use   Vaping status: Never Used  Substance and Sexual Activity   Alcohol use: No    Alcohol/week: 0.0 standard drinks of alcohol   Drug use: No   Sexual activity: Not Currently    Birth control/protection: Surgical  Other Topics Concern   Not on file  Social History Narrative   Pt lives alone   Social Determinants of Health   Financial Resource Strain: Low Risk  (05/07/2023)   Overall Financial Resource Strain (CARDIA)    Difficulty of Paying Living Expenses: Not hard at all  Food Insecurity: No Food Insecurity (05/07/2023)   Hunger Vital Sign    Worried About Running Out of Food in the Last Year: Never true    Ran Out of Food in the  Last Year: Never true  Transportation Needs: No Transportation Needs (05/07/2023)   PRAPARE - Administrator, Civil Service (Medical): No    Lack of Transportation (Non-Medical): No  Physical Activity: Sufficiently Active (05/07/2023)   Exercise Vital Sign    Days of Exercise per Week: 5 days    Minutes of Exercise per Session: 50 min  Stress: No Stress Concern Present (05/07/2023)   [employer] of Occupational Health - Occupational Stress Questionnaire    Feeling of Stress : Not at all  Social Connections: Socially Isolated (05/07/2023)   Social Connection and Isolation Panel [NHANES]    Frequency of Communication with Friends and Family: More than three times a week    Frequency of Social Gatherings with Friends and  Family: More than three times a week    Attends Religious Services: Never    Active Member of Clubs or Organizations: No    Attends Engineer, structural: Never    Marital Status: Never married    Tobacco Counseling Counseling given: Not Answered Tobacco comments: smoking cessation materials not required   Clinical Intake:  Pre-visit preparation completed: Yes  Pain : No/denies pain     Nutritional Risks: None Diabetes: No  How often do you need to have someone help you when you read instructions, pamphlets, or other written materials from your doctor or pharmacy?: 1 - Never  Interpreter Needed?: No  Information entered by :: Kennedy Bucker, LPN   Activities of Daily Living    05/07/2023    8:15 AM  In your present state of health, do you have any difficulty performing the following activities:  Hearing? 0  Vision? 0  Difficulty concentrating or making decisions? 0  Walking or climbing stairs? 0  Dressing or bathing? 0  Doing errands, shopping? 0  Preparing Food and eating ? N  Using the Toilet? N  In the past six months, have you accidently leaked urine? N  Do you have problems with loss of bowel control? N  Managing your  Medications? N  Managing your Finances? N  Housekeeping or managing your Housekeeping? N    Patient Care Team: Reubin Milan, MD as PCP - General (Internal Medicine) Jesusita Oka, MD as Consulting Physician (Dermatology) Delano Metz, MD as Referring Physician (Pain Medicine) Hildred Laser, MD as Referring Physician (Obstetrics and Gynecology)  Indicate any recent Medical Services you may have received from other than [facility] providers in the past year (date may be approximate).     Assessment:   This is a routine wellness examination for [place].  Hearing/Vision screen Hearing Screening - Comments:: No aids Vision Screening - Comments:: Wears glasses- Dr.King  Dietary issues and exercise activities discussed:     Goals Addressed             This Visit's Progress    DIET - EAT MORE FRUITS AND VEGETABLES       Stay Active and Independent. Strength/ balance training   On track    Why is this important?   Regular activity or exercise is important to managing back pain.  Activity helps to keep your muscles strong.  You will sleep better and feel more relaxed.  You will have more energy and feel less stressed.  If you are not active now, start slowly. Little changes make a big difference.  Rest, but not too much.  Stay as active as you can and listen to your body's signals.            Depression Screen    05/07/2023    8:13 AM 05/23/2022    8:17 AM 04/30/2022    8:12 AM 10/01/2021    1:23 PM 05/15/2021    9:21 AM 04/23/2021    8:22 AM 09/11/2020   10:19 AM  PHQ 2/9 Scores  PHQ - 2 Score 0 0 0 0 0 0 0  PHQ- 9 Score 0 0  0 0  0    Fall Risk    05/07/2023    8:15 AM 05/23/2022    8:17 AM 05/03/2022    8:35 AM 04/30/2022    8:14 AM 10/01/2021    1:24 PM  Fall Risk   Falls in the past year? 0 0 0 0 0  Number  falls in past yr: 0 0 0 0 0  Injury with Fall? 0 0 0 0 0  Risk for fall due to : No Fall Risks No Fall Risks No Fall Risks No Fall Risks No Fall Risks   Follow up Falls prevention discussed;Falls evaluation completed Falls evaluation completed Falls evaluation completed Falls evaluation completed Falls evaluation completed    MEDICARE RISK AT HOME: Medicare Risk at Home Any stairs in or around the home?: Yes If so, are there any without handrails?: No Home free of loose throw rugs in walkways, pet beds, electrical cords, etc?: Yes Adequate lighting in your home to reduce risk of falls?: Yes Life alert?: No Use of a cane, walker or w/c?: No Grab bars in the bathroom?: No Shower chair or bench in shower?: Yes Elevated toilet seat or a handicapped toilet?: Yes  TIMED UP AND GO:  Was the test performed?  Yes  Length of time to ambulate 10 feet: 4 sec Gait steady and fast without use of assistive device    Cognitive Function:        05/07/2023    8:16 AM 04/30/2022    8:17 AM 03/29/2019    9:35 AM 03/23/2018    9:20 AM 03/17/2017    8:29 AM  6CIT Screen  What Year? 0 points 0 points 0 points 0 points 0 points  What month? 0 points 0 points 0 points 0 points 0 points  What time? 0 points 0 points 0 points 0 points 0 points  Count back from 20 0 points 0 points 0 points 0 points 0 points  Months in reverse 0 points 0 points 0 points 0 points 0 points  Repeat phrase 0 points 0 points 0 points 2 points 2 points  Total Score 0 points 0 points 0 points 2 points 2 points    Immunizations Immunization History  Administered Date(s) Administered   Fluad Quad(high Dose 65+) 05/20/2019, 06/07/2020, 05/23/2022   Influenza-Unspecified 05/23/2015, 06/01/2018, 06/25/2021   Moderna Covid-19 Vaccine Bivalent Booster 61yrs & up 06/04/2021   PFIZER(Purple Top)SARS-COV-2 Vaccination 11/11/2019, 12/07/2019, 06/19/2020, 02/13/2021   Pneumococcal Conjugate-13 03/17/2017   Pneumococcal Polysaccharide-23 03/23/2018   Tdap 03/13/2016   Zoster Recombinant(Shingrix) 08/31/2018, 11/18/2018   Zoster, Live 08/19/2011    TDAP status: Up to date  Flu  Vaccine status: Up to date  Pneumococcal vaccine status: Up to date  Covid-19 vaccine status: Completed vaccines  Qualifies for Shingles Vaccine? Yes   Zostavax completed Yes   Shingrix Completed?: Yes  Screening Tests Health Maintenance  Topic Date Due   COVID-19 Vaccine (6 - 2023-24 season) 05/17/2022   INFLUENZA VACCINE  04/17/2023   MAMMOGRAM  04/20/2024   Medicare Annual Wellness (AWV)  05/06/2024   Colonoscopy  01/22/2025   DTaP/Tdap/Td (2 - Td or Tdap) 03/13/2026   Pneumonia Vaccine 36+ Years old  Completed   DEXA SCAN  Completed   Hepatitis C Screening  Completed   Zoster Vaccines- Shingrix  Completed   HPV VACCINES  Aged Out    Health Maintenance  Health Maintenance Due  Topic Date Due   COVID-19 Vaccine (6 - 2023-24 season) 05/17/2022   INFLUENZA VACCINE  04/17/2023    Colorectal cancer screening: Type of screening: Colonoscopy. Completed 01/23/15. Repeat every 10 years  Mammogram status: Completed 04/21/23. Repeat every year  Bone Density status: Completed 06/13/22. Results reflect: Bone density results: OSTEOPENIA. Repeat every 5 years.  Lung Cancer Screening: (Low Dose CT Chest recommended if Age 12-80 years, 20 pack-year  currently smoking OR have quit w/in 15years.) does not qualify.   Additional Screening:  Hepatitis C Screening: does qualify; Completed 03/13/16  Vision Screening: Recommended annual ophthalmology exams for early detection of glaucoma and other disorders of the eye. Is the patient up to date with their annual eye exam?  Yes  Who is the provider or what is the name of the office in which the patient attends annual eye exams? Dr.King If pt is not established with a provider, would they like to be referred to a provider to establish care? No .   Dental Screening: Recommended annual dental exams for proper oral hygiene  Community Resource Referral / Chronic Care Management: CRR required this visit?  No   CCM required this visit?  No      Plan:     I have personally reviewed and noted the following in the patient's chart:   Medical and social history Use of alcohol, tobacco or illicit drugs  Current medications and supplements including opioid prescriptions. Patient is not currently taking opioid prescriptions. Functional ability and status Nutritional status Physical activity Advanced directives List of other physicians Hospitalizations, surgeries, and ER visits in previous 12 months Vitals Screenings to include cognitive, depression, and falls Referrals and appointments  In addition, I have reviewed and discussed with patient certain preventive protocols, quality metrics, and best practice recommendations. A written personalized care plan for preventive services as well as general preventive health recommendations were provided to patient.     Hal Hope, LPN   1/61/0960   After Visit Summary: (MyChart) Due to this being a telephonic visit, the after visit summary with patients personalized plan was offered to patient via MyChart   Nurse Notes: none

## 2023-05-27 ENCOUNTER — Ambulatory Visit (INDEPENDENT_AMBULATORY_CARE_PROVIDER_SITE_OTHER): Payer: Medicare Other | Admitting: Internal Medicine

## 2023-05-27 ENCOUNTER — Encounter: Payer: Self-pay | Admitting: Internal Medicine

## 2023-05-27 VITALS — BP 124/70 | HR 64 | Ht 66.0 in | Wt 137.8 lb

## 2023-05-27 DIAGNOSIS — K222 Esophageal obstruction: Secondary | ICD-10-CM

## 2023-05-27 DIAGNOSIS — M19041 Primary osteoarthritis, right hand: Secondary | ICD-10-CM | POA: Insufficient documentation

## 2023-05-27 DIAGNOSIS — E785 Hyperlipidemia, unspecified: Secondary | ICD-10-CM

## 2023-05-27 DIAGNOSIS — Z23 Encounter for immunization: Secondary | ICD-10-CM | POA: Diagnosis not present

## 2023-05-27 DIAGNOSIS — M818 Other osteoporosis without current pathological fracture: Secondary | ICD-10-CM

## 2023-05-27 DIAGNOSIS — M19042 Primary osteoarthritis, left hand: Secondary | ICD-10-CM | POA: Diagnosis not present

## 2023-05-27 DIAGNOSIS — R7303 Prediabetes: Secondary | ICD-10-CM | POA: Diagnosis not present

## 2023-05-27 DIAGNOSIS — Z0000 Encounter for general adult medical examination without abnormal findings: Secondary | ICD-10-CM

## 2023-05-27 MED ORDER — ATORVASTATIN CALCIUM 10 MG PO TABS
ORAL_TABLET | ORAL | 3 refills | Status: DC
Start: 2023-05-27 — End: 2024-05-27

## 2023-05-27 NOTE — Addendum Note (Signed)
Addended by: Mariel Sleet on: 05/27/2023 08:47 AM   Modules accepted: Orders

## 2023-05-27 NOTE — Assessment & Plan Note (Signed)
Reflux symptoms are minimal on current therapy - pantoprazole. No red flag signs such as weight loss, n/v, melena  

## 2023-05-27 NOTE — Assessment & Plan Note (Signed)
S/p 5+ years of Fosamax Discontinued last year - will continue calcium and vitamin D plus exercise

## 2023-05-27 NOTE — Assessment & Plan Note (Signed)
LDL is  Lab Results  Component Value Date   LDLCALC 65 05/23/2022   Currently being treated with atorvastatin with good compliance and no concerns.

## 2023-05-27 NOTE — Progress Notes (Signed)
Date:  05/27/2023   Name:  Valerie Raymond   DOB:  [birth year]   MRN:  [id number]   Chief Complaint: Annual Exam Valerie Raymond is a 72 y.o. female who presents today for her Complete Annual Exam. She feels well. She reports exercising- patient goes to the gym 5 days a week. She reports she is sleeping well. Breast complaints - none. She is enjoying retirement - doing what she wants to do around the house, etc.  Mammogram: 04/2023 DEXA: 04/2022 osteopenia Colonoscopy: 01/2015 repeat 10 yrs  Health Maintenance Due  Topic Date Due   INFLUENZA VACCINE  04/17/2023   COVID-19 Vaccine (6 - 2023-24 season) 05/18/2023    Immunization History  Administered Date(s) Administered   Fluad Quad(high Dose 65+) 05/20/2019, 06/07/2020, 05/23/2022   Influenza-Unspecified 05/23/2015, 06/01/2018, 06/25/2021   Moderna Covid-19 Vaccine Bivalent Booster 45yrs & up 06/04/2021   PFIZER(Purple Top)SARS-COV-2 Vaccination 11/11/2019, 12/07/2019, 06/19/2020, 02/13/2021   Pneumococcal Conjugate-13 03/17/2017   Pneumococcal Polysaccharide-23 03/23/2018   Tdap 03/13/2016   Zoster Recombinant(Shingrix) 08/31/2018, 11/18/2018   Zoster, Live 08/19/2011    Hyperlipidemia This is a chronic problem. The problem is controlled. Pertinent negatives include no chest pain or shortness of breath. Current antihyperlipidemic treatment includes statins.  Diabetes She presents for her follow-up diabetic visit. Diabetes type: prediabetes. Pertinent negatives for hypoglycemia include no dizziness, headaches, nervousness/anxiousness or tremors. Pertinent negatives for diabetes include no chest pain, no fatigue, no polydipsia and no polyuria. Current diabetic treatment includes diet.  Gastroesophageal Reflux She complains of heartburn. She reports no abdominal pain, no chest pain, no coughing or no wheezing. This is a recurrent problem. The problem occurs occasionally. Pertinent negatives include no fatigue. She has tried a PPI for  the symptoms.    Lab Results  Component Value Date   NA 140 05/23/2022   K 4.5 05/23/2022   CO2 22 05/23/2022   GLUCOSE 90 05/23/2022   BUN 14 05/23/2022   CREATININE 0.77 05/23/2022   CALCIUM 10.3 05/23/2022   EGFR 83 05/23/2022   GFRNONAA 84 09/11/2020   Lab Results  Component Value Date   CHOL 138 05/23/2022   HDL 47 05/23/2022   LDLCALC 65 05/23/2022   TRIG 150 (H) 05/23/2022   CHOLHDL 2.9 05/23/2022   Lab Results  Component Value Date   TSH 2.140 05/23/2022   Lab Results  Component Value Date   HGBA1C 5.9 (H) 05/23/2022   Lab Results  Component Value Date   WBC 6.3 05/23/2022   HGB 13.6 05/23/2022   HCT 39.2 05/23/2022   MCV 88 05/23/2022   PLT 246 05/23/2022   Lab Results  Component Value Date   ALT 21 05/23/2022   AST 26 05/23/2022   ALKPHOS 89 05/23/2022   BILITOT 0.4 05/23/2022   Lab Results  Component Value Date   25OHVITD2 <1.0 08/04/2019   25OHVITD3 39 08/04/2019   VD25OH 50.0 09/11/2020     Review of Systems  Constitutional:  Negative for chills, fatigue and fever.  HENT:  Negative for congestion, hearing loss, tinnitus, trouble swallowing and voice change.   Eyes:  Negative for visual disturbance.  Respiratory:  Negative for cough, chest tightness, shortness of breath and wheezing.   Cardiovascular:  Negative for chest pain, palpitations and leg swelling.  Gastrointestinal:  Positive for heartburn. Negative for abdominal pain, constipation, diarrhea and vomiting.  Endocrine: Negative for polydipsia and polyuria.  Genitourinary:  Negative for dysuria, frequency, genital sores, vaginal bleeding and vaginal discharge.  Musculoskeletal:  Positive for arthralgias (back and hands) and back pain (managed with exercise). Negative for gait problem and joint swelling.  Skin:  Negative for color change and rash.  Neurological:  Negative for dizziness, tremors, light-headedness and headaches.  Hematological:  Negative for adenopathy. Does not  bruise/bleed easily.  Psychiatric/Behavioral:  Negative for dysphoric mood and sleep disturbance. The patient is not nervous/anxious.     Patient Active Problem List   Diagnosis Date Noted   Osteoarthritis of fingers of both hands 05/27/2023   Osteoarthritis of hip (Left) 03/22/2020   Grade 1 Lumbar Retrolisthesis L3/L4 08/03/2019   DDD (degenerative disc disease), lumbosacral 08/03/2019   Lumbosacral intervertebral disc displacement (IVDD) 08/03/2019   Lumbar facet arthropathy (Multilevel) (Bilateral) 08/03/2019   Lumbosacral foraminal stenosis (Bilateral) (L5-S1) 08/03/2019   Lumbar lateral recess stenosis (Left) (L2-3) 08/03/2019   Hyperlipidemia, mild 05/22/2018   Stricture and stenosis of esophagus    Dysphagia 03/21/2017   Family history of breast cancer in first degree relative 01/02/2016   Status post total abdominal hysterectomy and bilateral salpingo-oophorectomy (TAH-BSO) 01/02/2016   Vaginal atrophy 01/02/2016   Incomplete bladder emptying 01/02/2016   Midline cystocele 01/02/2016   Colon, diverticulosis 07/23/2015   Arthritis of knee, degenerative 07/23/2015   Other osteoporosis without current pathological fracture 07/23/2015   Avitaminosis D 07/23/2015    Allergies  Allergen Reactions   Lodine [Etodolac] Diarrhea and Nausea And Vomiting    Past Surgical History:  Procedure Laterality Date   ABDOMINAL HYSTERECTOMY  09/16/1990   CATARACT EXTRACTION W/PHACO Right 12/03/2021   Procedure: CATARACT EXTRACTION PHACO AND INTRAOCULAR LENS PLACEMENT (IOC) RIGHT 4.01 00:33.0;  Surgeon: Nevada Crane, MD;  Location: [facility];  Service: Ophthalmology;  Laterality: Right;   CATARACT EXTRACTION W/PHACO Left 12/17/2021   Procedure: CATARACT EXTRACTION PHACO AND INTRAOCULAR LENS PLACEMENT (IOC) LEFT 2.03 00:31.7;  Surgeon: Nevada Crane, MD;  Location: [facility];  Service: Ophthalmology;  Laterality: Left;   COLONOSCOPY  09/16/2004   COLONOSCOPY N/A  01/23/2015   Procedure: COLONOSCOPY;  Surgeon: Midge Minium, MD;  Location: [facility];  Service: Gastroenterology;  Laterality: N/A;  cecum- [id number]   ESOPHAGEAL DILATION  04/21/2017   Procedure: ESOPHAGEAL DILATION;  Surgeon: Midge Minium, MD;  Location: [facility];  Service: Endoscopy;;   ESOPHAGEAL DILATION  07/07/2017   Procedure: ESOPHAGEAL DILATION;  Surgeon: Midge Minium, MD;  Location: [facility];  Service: Endoscopy;;   ESOPHAGEAL DILATION N/A 07/28/2017   Procedure: ESOPHAGEAL DILATION;  Surgeon: Midge Minium, MD;  Location: [facility];  Service: Endoscopy;  Laterality: N/A;   ESOPHAGOGASTRODUODENOSCOPY N/A 04/21/2017   Procedure: ESOPHAGOGASTRODUODENOSCOPY (EGD);  Surgeon: Midge Minium, MD;  Location: [facility];  Service: Endoscopy;  Laterality: N/A;   ESOPHAGOGASTRODUODENOSCOPY (EGD) WITH PROPOFOL N/A 07/07/2017   Procedure: ESOPHAGOGASTRODUODENOSCOPY (EGD) WITH PROPOFOL;  Surgeon: Midge Minium, MD;  Location: [facility];  Service: Endoscopy;  Laterality: N/A;   ESOPHAGOGASTRODUODENOSCOPY (EGD) WITH PROPOFOL N/A 07/28/2017   Procedure: ESOPHAGOGASTRODUODENOSCOPY (EGD) WITH PROPOFOL;  Surgeon: Midge Minium, MD;  Location: [facility];  Service: Endoscopy;  Laterality: N/A;   SALPINGOOPHORECTOMY Bilateral    TOTAL HIP ARTHROPLASTY Left 09/2020    Social History   Tobacco Use   Smoking status: Never    Passive exposure: Never   Smokeless tobacco: Never   Tobacco comments:    smoking cessation materials not required  Vaping Use   Vaping status: Never Used  Substance Use Topics   Alcohol use: No  Alcohol/week: 0.0 standard drinks of alcohol   Drug use: No     Medication list has been reviewed and updated.  Current Meds  Medication Sig   ARTIFICIAL TEARS 1 % ophthalmic solution Place 1 drop into both eyes daily as needed (Dry eye).    Calcium Carbonate-Vit D-Min (CALCIUM 1200 PO) Take 1,200 mg by mouth daily.     Cholecalciferol (VITAMIN D3) 50 MCG (2000 UT) capsule Take 2,000 Units by mouth daily. am   co-enzyme Q-10 30 MG capsule Take 30 mg by mouth daily.   conjugated estrogens (PREMARIN) vaginal cream PLACE VAGINALLY 2 TIMES A WEEK   CRANBERRY PO Take by mouth.   meloxicam (MOBIC) 15 MG tablet TAKE 1 TABLET(15 MG) BY MOUTH DAILY AS NEEDED FOR PAIN   mometasone (ELOCON) 0.1 % cream Apply 1 application  topically daily.   pantoprazole (PROTONIX) 40 MG tablet TAKE 1 TABLET(40 MG) BY MOUTH DAILY IN THE MORNING   [DISCONTINUED] atorvastatin (LIPITOR) 10 MG tablet TAKE 1 TABLET(10 MG) BY MOUTH DAILY       05/27/2023    8:16 AM 05/23/2022    8:17 AM 10/01/2021    1:24 PM 05/15/2021    9:21 AM  GAD 7 : Generalized Anxiety Score  Nervous, Anxious, on Edge 0 0 0 0  Control/stop worrying 0 0 0 0  Worry too much - different things 0 0 0 0  Trouble relaxing 0 0 0 0  Restless 0 0 0 0  Easily annoyed or irritable 0 0 0 0  Afraid - awful might happen 0 0 0 0  Total GAD 7 Score 0 0 0 0  Anxiety Difficulty Not difficult at all Not difficult at all Not difficult at all        05/27/2023    8:16 AM 05/07/2023    8:13 AM 05/23/2022    8:17 AM  Depression screen PHQ 2/9  Decreased Interest 0 0 0  Down, Depressed, Hopeless 0 0 0  PHQ - 2 Score 0 0 0  Altered sleeping 0 0 0  Tired, decreased energy 0 0 0  Change in appetite 0 0 0  Feeling bad or failure about yourself  0 0 0  Trouble concentrating 0 0 0  Moving slowly or fidgety/restless 0 0 0  Suicidal thoughts 0 0 0  PHQ-9 Score 0 0 0  Difficult doing work/chores Not difficult at all Not difficult at all Not difficult at all    BP Readings from Last 3 Encounters:  05/27/23 124/70  05/07/23 (!) 142/73  05/07/23 130/70    Physical Exam Vitals and nursing note reviewed.  Constitutional:      General: She is not in acute distress.    Appearance: She is well-developed.  HENT:     Head: Normocephalic and atraumatic.     Right Ear: Tympanic  membrane and ear canal normal.     Left Ear: Tympanic membrane and ear canal normal.     Nose:     Right Sinus: No maxillary sinus tenderness.     Left Sinus: No maxillary sinus tenderness.  Eyes:     General: No scleral icterus.       Right eye: No discharge.        Left eye: No discharge.     Conjunctiva/sclera: Conjunctivae normal.  Neck:     Thyroid: No thyromegaly.     Vascular: No carotid bruit.  Cardiovascular:     Rate and Rhythm: Normal rate and regular rhythm.  Pulses: Normal pulses.     Heart sounds: Normal heart sounds.  Pulmonary:     Effort: Pulmonary effort is normal. No respiratory distress.     Breath sounds: No wheezing.  Chest:  Breasts:    Right: No mass, nipple discharge, skin change or tenderness.     Left: No mass, nipple discharge, skin change or tenderness.  Abdominal:     General: Bowel sounds are normal.     Palpations: Abdomen is soft.     Tenderness: There is no abdominal tenderness.  Musculoskeletal:     Cervical back: Normal range of motion. No erythema.     Right lower leg: No edema.     Left lower leg: No edema.     Comments: OA changes to DIP and PIP joints  Lymphadenopathy:     Cervical: No cervical adenopathy.  Skin:    General: Skin is warm and dry.     Findings: No rash.  Neurological:     Mental Status: She is alert and oriented to person, place, and time.     Cranial Nerves: No cranial nerve deficit.     Sensory: No sensory deficit.     Deep Tendon Reflexes: Reflexes are normal and symmetric.  Psychiatric:        Attention and Perception: Attention normal.        Mood and Affect: Mood normal.     Wt Readings from Last 3 Encounters:  05/27/23 137 lb 12.8 oz (62.5 kg)  05/07/23 137 lb 3.2 oz (62.2 kg)  05/07/23 138 lb 6.4 oz (62.8 kg)    BP 124/70   Pulse 64   Ht 5\' 6"  (1.676 m)   Wt 137 lb 12.8 oz (62.5 kg)   SpO2 98%   BMI 22.24 kg/m   Assessment and Plan:  Problem List Items Addressed This Visit        Unprioritized   Stricture and stenosis of esophagus (Chronic)    Reflux symptoms are minimal on current therapy - pantoprazole. No red flag signs such as weight loss, n/v, melena       Relevant Orders   CBC with Differential/Platelet   Other osteoporosis without current pathological fracture (Chronic)    S/p 5+ years of Fosamax Discontinued last year - will continue calcium and vitamin D plus exercise      Osteoarthritis of fingers of both hands    Continue Mobic daily PRN Will monitor renal function      Hyperlipidemia, mild (Chronic)    LDL is  Lab Results  Component Value Date   LDLCALC 65 05/23/2022   Currently being treated with atorvastatin with good compliance and no concerns.       Relevant Medications   atorvastatin (LIPITOR) 10 MG tablet   Other Relevant Orders   Lipid panel   Other Visit Diagnoses     Annual physical exam    -  Primary   Normal exam; UTD on immunizations and screenings   Prediabetes       she has reduced her carb intake and is exercising regularly   Relevant Orders   Comprehensive metabolic panel   Hemoglobin A1c       No follow-ups on file.    Reubin Milan, MD [facility] [place]

## 2023-05-27 NOTE — Assessment & Plan Note (Signed)
Continue Mobic daily PRN Will monitor renal function

## 2023-05-28 LAB — COMPREHENSIVE METABOLIC PANEL
ALT: 16 IU/L (ref 0–32)
AST: 28 IU/L (ref 0–40)
Albumin: 4.7 g/dL (ref 3.8–4.8)
Alkaline Phosphatase: 87 IU/L (ref 44–121)
BUN/Creatinine Ratio: 15 (ref 12–28)
BUN: 11 mg/dL (ref 8–27)
Bilirubin Total: 0.4 mg/dL (ref 0.0–1.2)
CO2: 23 mmol/L (ref 20–29)
Calcium: 9.9 mg/dL (ref 8.7–10.3)
Chloride: 106 mmol/L (ref 96–106)
Creatinine, Ser: 0.74 mg/dL (ref 0.57–1.00)
Globulin, Total: 2.3 g/dL (ref 1.5–4.5)
Glucose: 93 mg/dL (ref 70–99)
Potassium: 4.4 mmol/L (ref 3.5–5.2)
Sodium: 143 mmol/L (ref 134–144)
Total Protein: 7 g/dL (ref 6.0–8.5)
eGFR: 86 mL/min/{1.73_m2} (ref >59)

## 2023-05-28 LAB — CBC WITH DIFFERENTIAL/PLATELET
Basophils Absolute: 0.1 10*3/uL (ref 0.0–0.2)
Basos: 1 %
EOS (ABSOLUTE): 0.2 10*3/uL (ref 0.0–0.4)
Eos: 3 %
Hematocrit: 39.6 % (ref 34.0–46.6)
Hemoglobin: 12.6 g/dL (ref 11.1–15.9)
Immature Grans (Abs): 0 10*3/uL (ref 0.0–0.1)
Immature Granulocytes: 0 %
Lymphocytes Absolute: 0.8 10*3/uL (ref 0.7–3.1)
Lymphs: 16 %
MCH: 29.1 pg (ref 26.6–33.0)
MCHC: 31.8 g/dL (ref 31.5–35.7)
MCV: 92 fL (ref 79–97)
Monocytes Absolute: 0.5 10*3/uL (ref 0.1–0.9)
Monocytes: 11 %
Neutrophils Absolute: 3.4 10*3/uL (ref 1.4–7.0)
Neutrophils: 69 %
Platelets: 259 10*3/uL (ref 150–450)
RBC: 4.33 x10E6/uL (ref 3.77–5.28)
RDW: 13.2 % (ref 11.7–15.4)
WBC: 5 10*3/uL (ref 3.4–10.8)

## 2023-05-28 LAB — LIPID PANEL
Chol/HDL Ratio: 2.1 ratio (ref 0.0–4.4)
Cholesterol, Total: 143 mg/dL (ref 100–199)
HDL: 67 mg/dL (ref >39)
LDL Chol Calc (NIH): 65 mg/dL (ref 0–99)
Triglycerides: 52 mg/dL (ref 0–149)
VLDL Cholesterol Cal: 11 mg/dL (ref 5–40)

## 2023-05-28 LAB — HEMOGLOBIN A1C
Est. average glucose Bld gHb Est-mCnc: 126 mg/dL
Hgb A1c MFr Bld: 6 % — ABNORMAL HIGH (ref 4.8–5.6)

## 2023-05-28 NOTE — Progress Notes (Signed)
PC to pt, discussed labs, voiced understanding.

## 2023-08-20 ENCOUNTER — Other Ambulatory Visit: Payer: Self-pay | Admitting: Internal Medicine

## 2023-08-22 NOTE — Telephone Encounter (Signed)
Requested Prescriptions  Pending Prescriptions Disp Refills   pantoprazole (PROTONIX) 40 MG tablet [Pharmacy Med Name: PANTOPRAZOLE SODIUM 40MG  TABLET DR] 90 tablet 3    Sig: TAKE ONE (1) TABLET BY MOUTH DAILY IN THE MORNING.     Gastroenterology: Proton Pump Inhibitors Passed - 08/20/2023  9:13 AM      Passed - Valid encounter within last 12 months    Recent Outpatient Visits           2 months ago Annual physical exam   [facility] at [facility], Nyoka Cowden, MD   1 year ago Annual physical exam   [facility] at [facility], Nyoka Cowden, MD   1 year ago Dysuria   [facility] at [facility], Nyoka Cowden, MD   2 years ago Annual physical exam   [facility] at [facility], Nyoka Cowden, MD   2 years ago Hyperlipidemia, mild   [facility] at [facility], Nyoka Cowden, MD       Future Appointments             In 9 months Judithann Graves, Nyoka Cowden, MD [facility] at [facility], [facility]

## 2023-12-23 ENCOUNTER — Other Ambulatory Visit: Payer: Self-pay | Admitting: Obstetrics and Gynecology

## 2023-12-23 ENCOUNTER — Telehealth: Payer: Self-pay

## 2023-12-23 MED ORDER — ESTRADIOL 0.1 MG/GM VA CREA: TOPICAL_CREAM | VAGINAL | 4 refills | Status: AC

## 2023-12-23 NOTE — Telephone Encounter (Signed)
 Message on triage to send generic medication, called for more information no answer

## 2023-12-23 NOTE — Addendum Note (Signed)
 Addended by: Fabian November on: 12/23/2023 04:41 PM   Modules accepted: Orders

## 2023-12-23 NOTE — Telephone Encounter (Signed)
 Please let patient know that I have called in the generic for the Premarin cream (Estrace) to her pharmacy for future use.

## 2023-12-24 NOTE — Telephone Encounter (Signed)
 Patient is aware of generic meds, patient states that she has gotten meds from pharmacy

## 2023-12-24 NOTE — Telephone Encounter (Signed)
 Unable to lvm to let her know medication was sent.

## 2024-01-13 DIAGNOSIS — H53031 Strabismic amblyopia, right eye: Secondary | ICD-10-CM | POA: Diagnosis not present

## 2024-02-25 DIAGNOSIS — L738 Other specified follicular disorders: Secondary | ICD-10-CM | POA: Diagnosis not present

## 2024-02-25 DIAGNOSIS — Z86018 Personal history of other benign neoplasm: Secondary | ICD-10-CM | POA: Diagnosis not present

## 2024-02-25 DIAGNOSIS — L578 Other skin changes due to chronic exposure to nonionizing radiation: Secondary | ICD-10-CM | POA: Diagnosis not present

## 2024-02-25 DIAGNOSIS — L300 Nummular dermatitis: Secondary | ICD-10-CM | POA: Diagnosis not present

## 2024-03-09 ENCOUNTER — Other Ambulatory Visit: Payer: Self-pay | Admitting: Internal Medicine

## 2024-03-09 DIAGNOSIS — Z1231 Encounter for screening mammogram for malignant neoplasm of breast: Secondary | ICD-10-CM

## 2024-03-20 ENCOUNTER — Other Ambulatory Visit: Payer: Self-pay | Admitting: Internal Medicine

## 2024-03-20 DIAGNOSIS — E785 Hyperlipidemia, unspecified: Secondary | ICD-10-CM

## 2024-03-23 NOTE — Telephone Encounter (Signed)
 Rx 05/27/23 #90 [id number]-too soon Requested Prescriptions  Pending Prescriptions Disp Refills   atorvastatin  (LIPITOR) 10 MG tablet [Pharmacy Med Name: ATORVASTATIN  CALCIUM  10MG  TABLET] 90 tablet 3    Sig: TAKE (1) TABLET BY MOUTH EVERY DAY     Cardiovascular:  Antilipid - Statins Failed - 03/23/2024 12:42 PM      Failed - Valid encounter within last 12 months    Recent Outpatient Visits   None     Future Appointments             In 2 months Justus Leita DEL, MD [facility] at [facility], [facility]            Failed - Lipid Panel in normal range within the last 12 months    Cholesterol, Total  Date Value Ref Range Status  05/27/2023 143 100 - 199 mg/dL Final   LDL Chol Calc (NIH)  Date Value Ref Range Status  05/27/2023 65 0 - 99 mg/dL Final   HDL  Date Value Ref Range Status  05/27/2023 67 >39 mg/dL Final   Triglycerides  Date Value Ref Range Status  05/27/2023 52 0 - 149 mg/dL Final         Passed - Patient is not pregnant

## 2024-04-21 ENCOUNTER — Ambulatory Visit
Admission: RE | Admit: 2024-04-21 | Discharge: 2024-04-21 | Disposition: A | Discharge status: Home / Self Care | Source: Ambulatory Visit | Attending: Internal Medicine | Admitting: Internal Medicine

## 2024-04-21 DIAGNOSIS — Z1231 Encounter for screening mammogram for malignant neoplasm of breast: Secondary | ICD-10-CM | POA: Diagnosis not present

## 2024-05-03 NOTE — Progress Notes (Unsigned)
 HPI:      [name] is a 73 y.o. G0P0 who presents today for her pessary follow up and examination related to her pelvic floor weakening/cystocele.  Pt reports tolerating the pessary well with no vaginal bleeding and no vaginal discharge.  Symptoms of pelvic floor weakening have greatly improved. She is voiding and defecating without difficulty. She currently has a size #2 ring pessary with support. She does removal and cleanings at home each evening, and sometimes if she's going to be home all day, will not wear it; was seeing Dr. Connell annually for an exam.   PMHx: Past Medical History:  Diagnosis Date   Abnormal MRI, hip joint (Bilateral) (05/31/2020) 06/19/2020   MRI of the hips revealed:  1. Severe degeneration of the right superior labrum with a small superior anterior labral tear and 5 mm paralabral cyst.  2. Extensive left anterior labral tear.  3. No hip fracture, dislocation or avascular necrosis.     Abnormal MRI, lumbar spine (06/29/2019) 08/03/2019   FINDINGS:  Alignment:  2 mm retrolisthesis of L3 on L4.  Paraspinal and other soft tissues: Stable right hydronephrosis which may be secondary to UPJ obstruction given relatively normal caliber proximal ureter.     Disc levels:  Disc spaces: Degenerative disease with disc height loss and reactive endplate changes at L2-3. Mild degenerative disc disease with disc height loss at L3-4, L4-5 and L5-S1   Arthralgia of hip 07/23/2015   Arthritis    knee,hip,hands   Chronic lumbar radiculitis 08/03/2019   EMG/PNCV (07/16/2017) (bilateral lower extremity) [facility]/Dr. Lane): WNL.  No evidence of large fiber disease.     Degenerative disc disease, lumbar    DJD (degenerative joint disease)    Dysphagia    with solids and regurgitation   Endometriosis    h/o   GERD (gastroesophageal reflux disease)    occasional   Heartburn    Joint pain    Osteoporosis    hip   Other spondylosis, sacral and sacrococcygeal region  04/25/2020   Pelvic pressure in female 01/02/2016   Rash    skin rash and itching   Tonsillith 10/09/2016   Vitamin D  deficiency    Current Outpatient Medications on File Prior to Visit  Medication Sig Dispense Refill   ARTIFICIAL TEARS 1 % ophthalmic solution Place 1 drop into both eyes daily as needed (Dry eye).      atorvastatin  (LIPITOR) 10 MG tablet TAKE 1 TABLET(10 MG) BY MOUTH DAILY 90 tablet 3   Calcium  Carbonate-Vit D-Min (CALCIUM  1200 PO) Take 1,200 mg by mouth daily.      Cholecalciferol (VITAMIN D3) 50 MCG (2000 UT) capsule Take 2,000 Units by mouth daily. am     co-enzyme Q-10 30 MG capsule Take 30 mg by mouth daily.     CRANBERRY PO Take by mouth.     estradiol  (ESTRACE  VAGINAL) 0.1 MG/GM vaginal cream Apply 1/2 gram (0.5 mg) vaginally twice a week. 42.5 g 4   meloxicam  (MOBIC ) 15 MG tablet TAKE 1 TABLET(15 MG) BY MOUTH DAILY AS NEEDED FOR PAIN 90 tablet 0   mometasone (ELOCON) 0.1 % cream Apply 1 application  topically daily.     pantoprazole  (PROTONIX ) 40 MG tablet TAKE ONE (1) TABLET BY MOUTH DAILY IN THE MORNING. 90 tablet 3   No current facility-administered medications on file prior to visit.   Allergies  Allergen Reactions   Lodine [Etodolac] Diarrhea and Nausea And Vomiting   ROS: pertinent items in  HPI  Objective: BP (!) 154/87   Pulse 70   Ht 5' 6 (1.676 m)   Wt 134 lb 12.8 oz (61.1 kg)   BMI 21.76 kg/m  Physical Exam Constitutional:      Appearance: Normal appearance.  Genitourinary:     Vulva normal.     No lesions in the vagina.     Right Labia: No lesions or skin changes.    Left Labia: No lesions or skin changes.    No labial fusion noted.     Vaginal cuff intact.    No vaginal discharge, erythema, tenderness, bleeding, ulceration or granulation tissue.     Anterior vaginal prolapse present.    Mild vaginal atrophy present. HENT:     Head: Normocephalic and atraumatic.  Pulmonary:     Effort: Pulmonary effort is normal.  Abdominal:      General: Abdomen is flat.  Musculoskeletal:        General: Normal range of motion.  Neurological:     General: No focal deficit present.     Mental Status: She is alert.  Vitals and nursing note reviewed. Exam conducted with a chaperone present.    Pessary Care  Pessary removed and cleaned.  Support net in ring cracked and torn in multiple spots.  Vagina checked - without erosions - new #2 ring with support inserted.  A/P: #2 ring with support replaced with new device today due to cracking/tearing of old one.  Instructions given for care. Pt removes at home nightly.  Concerning symptoms to observe for are counseled to patient. Follow up scheduled for 12 months.   Estil Mangle, DO [facility]

## 2024-05-06 ENCOUNTER — Encounter: Payer: Self-pay | Admitting: Obstetrics

## 2024-05-06 ENCOUNTER — Ambulatory Visit: Admitting: Obstetrics

## 2024-05-06 VITALS — BP 154/87 | HR 70 | Ht 66.0 in | Wt 134.8 lb

## 2024-05-06 DIAGNOSIS — Z4689 Encounter for fitting and adjustment of other specified devices: Secondary | ICD-10-CM

## 2024-05-06 DIAGNOSIS — N8111 Cystocele, midline: Secondary | ICD-10-CM

## 2024-05-12 ENCOUNTER — Ambulatory Visit: Payer: Medicare Other | Admitting: Emergency Medicine

## 2024-05-12 VITALS — Ht 66.0 in | Wt 132.0 lb

## 2024-05-12 DIAGNOSIS — Z0000 Encounter for general adult medical examination without abnormal findings: Secondary | ICD-10-CM

## 2024-05-12 NOTE — Patient Instructions (Signed)
 Valerie Raymond , Thank you for taking time out of your busy schedule to complete your Annual Wellness Visit with me. I enjoyed our conversation and look forward to speaking with you again next year. I, as well as your care team,  appreciate your ongoing commitment to your health goals. Please review the following plan we discussed and let me know if I can assist you in the future. Your Game plan/ To Do List    Referrals: None   Follow up Visits: We will see or speak with you next year for your Next Medicare AWV with our clinical staff Have you seen your provider in the last 6 months (3 months if uncontrolled diabetes)? No  Clinician Recommendations: Get the flu vaccine at your next OV on 05/27/24. Keep up the good work!!  Aim for 30 minutes of exercise or brisk walking, 6-8 glasses of water , and 5 servings of fruits and vegetables each day.       This is a list of the screenings recommended for you:  Health Maintenance  Topic Date Due   COVID-19 Vaccine (6 - 2024-25 season) 05/18/2023   Flu Shot  04/16/2024   Colon Cancer Screening  01/22/2025   Mammogram  04/21/2025   Medicare Annual Wellness Visit  05/12/2025   DTaP/Tdap/Td vaccine (2 - Td or Tdap) 03/13/2026   DEXA scan (bone density measurement)  06/14/2027   Pneumococcal Vaccine for age over 8  Completed   Hepatitis C Screening  Completed   Zoster (Shingles) Vaccine  Completed   HPV Vaccine  Aged Out   Meningitis B Vaccine  Aged Out    Advanced directives: (ACP Link)Information on Advanced Care Planning can be found at [place]  Best boy Advance Health Care Directives Advance Health Care Directives. [email or web address]/ You may also get the forms at your doctor's office. Advance Care Planning is important because it:  [x]  Makes sure you receive the medical care that is consistent with your values, goals, and preferences  [x]  It provides guidance to your family and loved ones and reduces their decisional burden about whether or  not they are making the right decisions based on your wishes.  Follow the link provided in your after visit summary or read over the paperwork we have mailed to you to help you started getting your Advance Directives in place. If you need assistance in completing these, please reach out to us  so that we can help you!  See attachments for Preventive Care and Fall Prevention Tips.   Fall Prevention in the Home, Adult Falls can cause injuries and affect people of all ages. There are many simple things that you can do to make your home safe and to help prevent falls. If you need it, ask for help making these changes. What actions can I take to prevent falls? General information Use good lighting in all rooms. Make sure to: Replace any light bulbs that burn out. Turn on lights if it is dark and use night-lights. Keep items that you use often in easy-to-reach places. Lower the shelves around your home if needed. Move furniture so that there are clear paths around it. Do not keep throw rugs or other things on the floor that can make you trip. If any of your floors are uneven, fix them. Add color or contrast paint or tape to clearly mark and help you see: Grab bars or handrails. First and last steps of staircases. Where the edge of each step is. If you use a  ladder or stepladder: Make sure that it is fully opened. Do not climb a closed ladder. Make sure the sides of the ladder are locked in place. Have someone hold the ladder while you use it. Know where your pets are as you move through your home. What can I do in the bathroom?     Keep the floor dry. Clean up any water  that is on the floor right away. Remove soap buildup in the bathtub or shower. Buildup makes bathtubs and showers slippery. Use non-skid mats or decals on the floor of the bathtub or shower. Attach bath mats securely with double-sided, non-slip rug tape. If you need to sit down while you are in the shower, use a non-slip  stool. Install grab bars by the toilet and in the bathtub and shower. Do not use towel bars as grab bars. What can I do in the bedroom? Make sure that you have a light by your bed that is easy to reach. Do not use any sheets or blankets on your bed that hang to the floor. Have a firm bench or chair with side arms that you can use for support when you get dressed. What can I do in the kitchen? Clean up any spills right away. If you need to reach something above you, use a sturdy step stool that has a grab bar. Keep electrical cables out of the way. Do not use floor polish or wax that makes floors slippery. What can I do with my stairs? Do not leave anything on the stairs. Make sure that you have a light switch at the top and the bottom of the stairs. Have them installed if you do not have them. Make sure that there are handrails on both sides of the stairs. Fix handrails that are broken or loose. Make sure that handrails are as long as the staircases. Install non-slip stair treads on all stairs in your home if they do not have carpet. Avoid having throw rugs at the top or bottom of stairs, or secure the rugs with carpet tape to prevent them from moving. Choose a carpet design that does not hide the edge of steps on the stairs. Make sure that carpet is firmly attached to the stairs. Fix any carpet that is loose or worn. What can I do on the outside of my home? Use bright outdoor lighting. Repair the edges of walkways and driveways and fix any cracks. Clear paths of anything that can make you trip, such as tools or rocks. Add color or contrast paint or tape to clearly mark and help you see high doorway thresholds. Trim any bushes or trees on the main path into your home. Check that handrails are securely fastened and in good repair. Both sides of all steps should have handrails. Install guardrails along the edges of any raised decks or porches. Have leaves, snow, and ice cleared regularly. Use  sand, salt, or ice melt on walkways during winter months if you live where there is ice and snow. In the garage, clean up any spills right away, including grease or oil spills. What other actions can I take? Review your medicines with your health care provider. Some medicines can make you confused or feel dizzy. This can increase your chance of falling. Wear closed-toe shoes that fit well and support your feet. Wear shoes that have rubber soles and low heels. Use a cane, walker, scooter, or crutches that help you move around if needed. Talk with your provider about other  ways that you can decrease your risk of falls. This may include seeing a physical therapist to learn to do exercises to improve movement and strength. Where to find more information Centers for Disease Control and Prevention, STEADI: TonerPromos.no [employer] on Aging: BaseRingTones.pl National Institute on Aging: BaseRingTones.pl Contact a health care provider if: You are afraid of falling at home. You feel weak, drowsy, or dizzy at home. You fall at home. Get help right away if you: Lose consciousness or have trouble moving after a fall. Have a fall that causes a head injury. These symptoms may be an emergency. Get help right away. Call 911. Do not wait to see if the symptoms will go away. Do not drive yourself to the hospital. This information is not intended to replace advice given to you by your health care provider. Make sure you discuss any questions you have with your health care provider. Document Revised: 05/06/2022 Document Reviewed: 05/06/2022 Elsevier Patient Education  2024 [employer].

## 2024-05-12 NOTE — Progress Notes (Signed)
 Subjective:   Valerie Raymond is a 73 y.o. who presents for a Medicare Wellness preventive visit.  As a reminder, Annual Wellness Visits don't include a physical exam, and some assessments may be limited, especially if this visit is performed virtually. We may recommend an in-person follow-up visit with your provider if needed.  Visit Complete: Virtual I connected with  Valerie Raymond on 05/12/24 by a audio enabled telemedicine application and verified that I am speaking with the correct person using two identifiers.  Patient Location: Home  Provider Location: Home Office  I discussed the limitations of evaluation and management by telemedicine. The patient expressed understanding and agreed to proceed.  Vital Signs: Because this visit was a virtual/telehealth visit, some criteria may be missing or patient reported. Any vitals not documented were not able to be obtained and vitals that have been documented are patient reported.  VideoDeclined- This patient declined Librarian, academic. Therefore the visit was completed with audio only.  Persons Participating in Visit: Patient.  AWV Questionnaire: No: Patient Medicare AWV questionnaire was not completed prior to this visit.  Cardiac Risk Factors include: advanced age (>67men, >77 women);dyslipidemia     Objective:    Today's Vitals   05/12/24 0807  Weight: 132 lb (59.9 kg)  Height: 5' 6 (1.676 m)   Body mass index is 21.31 kg/m.     05/12/2024    8:16 AM 05/07/2023    8:15 AM 12/17/2021    7:30 AM 12/03/2021   10:04 AM 04/23/2021    8:23 AM 08/09/2020    8:15 AM 06/19/2020    8:33 AM  Advanced Directives  Does Patient Have a Medical Advance Directive? No No No No No No No  Would patient like information on creating a medical advance directive? Yes (MAU/Ambulatory/Procedural Areas - Information given) No - Patient declined No - Patient declined No - Patient declined No - Patient declined No - Patient  declined No - Patient declined    Current Medications (verified) Outpatient Encounter Medications as of 05/12/2024  Medication Sig   ARTIFICIAL TEARS 1 % ophthalmic solution Place 1 drop into both eyes daily as needed (Dry eye).    atorvastatin  (LIPITOR) 10 MG tablet TAKE 1 TABLET(10 MG) BY MOUTH DAILY   Calcium  Carbonate-Vit D-Min (CALCIUM  1200 PO) Take 1,200 mg by mouth daily.    Cholecalciferol (VITAMIN D3) 50 MCG (2000 UT) capsule Take 2,000 Units by mouth daily. am   co-enzyme Q-10 30 MG capsule Take 30 mg by mouth daily.   CRANBERRY PO Take by mouth.   estradiol  (ESTRACE  VAGINAL) 0.1 MG/GM vaginal cream Apply 1/2 gram (0.5 mg) vaginally twice a week.   meloxicam  (MOBIC ) 15 MG tablet TAKE 1 TABLET(15 MG) BY MOUTH DAILY AS NEEDED FOR PAIN   mometasone (ELOCON) 0.1 % cream Apply 1 application  topically daily. (Patient taking differently: Apply 1 application  topically daily. PRN)   pantoprazole  (PROTONIX ) 40 MG tablet TAKE ONE (1) TABLET BY MOUTH DAILY IN THE MORNING.   No facility-administered encounter medications on file as of 05/12/2024.    Allergies (verified) Lodine [etodolac]   History: Past Medical History:  Diagnosis Date   Abnormal MRI, hip joint (Bilateral) (05/31/2020) 06/19/2020   MRI of the hips revealed:  1. Severe degeneration of the right superior labrum with a small superior anterior labral tear and 5 mm paralabral cyst.  2. Extensive left anterior labral tear.  3. No hip fracture, dislocation or avascular necrosis.  Abnormal MRI, lumbar spine (06/29/2019) 08/03/2019   FINDINGS:  Alignment:  2 mm retrolisthesis of L3 on L4.  Paraspinal and other soft tissues: Stable right hydronephrosis which may be secondary to UPJ obstruction given relatively normal caliber proximal ureter.     Disc levels:  Disc spaces: Degenerative disease with disc height loss and reactive endplate changes at L2-3. Mild degenerative disc disease with disc height loss at L3-4, L4-5 and L5-S1    Arthralgia of hip 07/23/2015   Arthritis    knee,hip,hands   Chronic lumbar radiculitis 08/03/2019   EMG/PNCV (07/16/2017) (bilateral lower extremity) [facility]/Dr. Lane): WNL.  No evidence of large fiber disease.     Degenerative disc disease, lumbar    DJD (degenerative joint disease)    Dysphagia    with solids and regurgitation   Endometriosis    h/o   GERD (gastroesophageal reflux disease)    occasional   Heartburn    Joint pain    Osteoporosis    hip   Other spondylosis, sacral and sacrococcygeal region 04/25/2020   Pelvic pressure in female 01/02/2016   Rash    skin rash and itching   Tonsillith 10/09/2016   Vitamin D  deficiency    Past Surgical History:  Procedure Laterality Date   ABDOMINAL HYSTERECTOMY  09/16/1990   CATARACT EXTRACTION W/PHACO Right 12/03/2021   Procedure: CATARACT EXTRACTION PHACO AND INTRAOCULAR LENS PLACEMENT (IOC) RIGHT 4.01 00:33.0;  Surgeon: Myrna Adine Anes, MD;  Location[facility];  Service: Ophthalmology;  Laterality: Right;   CATARACT EXTRACTION W/PHACO Left 12/17/2021   Procedure: CATARACT EXTRACTION PHACO AND INTRAOCULAR LENS PLACEMENT (IOC) LEFT 2.03 00:31.7;  Surgeon: Myrna Adine Anes, MD;  Location: [facility];  Service: Ophthalmology;  Laterality: Left;   COLONOSCOPY  09/16/2004   COLONOSCOPY N/A 01/23/2015   Procedure: COLONOSCOPY;  Surgeon: Rogelia Copping, MD;  Location: [facility];  Service: Gastroenterology;  Laterality: N/A;  cecum- [id number]   ESOPHAGEAL DILATION  04/21/2017   Procedure: ESOPHAGEAL DILATION;  Surgeon: Copping Rogelia, MD;  Location: [facility];  Service: Endoscopy;;   ESOPHAGEAL DILATION  07/07/2017   Procedure: ESOPHAGEAL DILATION;  Surgeon: Copping Rogelia, MD;  Location: [facility];  Service: Endoscopy;;   ESOPHAGEAL DILATION N/A 07/28/2017   Procedure: ESOPHAGEAL DILATION;  Surgeon: Copping Rogelia, MD;  Location: [facility];  Service: Endoscopy;  Laterality:  N/A;   ESOPHAGOGASTRODUODENOSCOPY N/A 04/21/2017   Procedure: ESOPHAGOGASTRODUODENOSCOPY (EGD);  Surgeon: Copping Rogelia, MD;  Location: [facility];  Service: Endoscopy;  Laterality: N/A;   ESOPHAGOGASTRODUODENOSCOPY (EGD) WITH PROPOFOL  N/A 07/07/2017   Procedure: ESOPHAGOGASTRODUODENOSCOPY (EGD) WITH PROPOFOL ;  Surgeon: Copping Rogelia, MD;  Location: [facility];  Service: Endoscopy;  Laterality: N/A;   ESOPHAGOGASTRODUODENOSCOPY (EGD) WITH PROPOFOL  N/A 07/28/2017   Procedure: ESOPHAGOGASTRODUODENOSCOPY (EGD) WITH PROPOFOL ;  Surgeon: Copping Rogelia, MD;  Location: [facility];  Service: Endoscopy;  Laterality: N/A;   SALPINGOOPHORECTOMY Bilateral    TOTAL HIP ARTHROPLASTY Left 09/2020   Family History  Problem Relation Age of Onset   Pancreatic cancer Mother    Breast cancer Sister 49   Prostate cancer Father    Breast cancer Maternal Aunt    Breast cancer Cousin        pat cousin   Breast cancer Sister 29   Colon cancer Paternal Uncle    Prostate cancer Brother    Ovarian cancer Neg Hx    Diabetes Neg Hx    Social History   Socioeconomic History   Marital  status: Single    Spouse name: Not on file   Number of children: 0   Years of education: Not on file   Highest education level: Associate degree: academic program  Occupational History    Employer: [employer]   Occupation: retired    Start: 2024  Tobacco Use   Smoking status: Never    Passive exposure: Never   Smokeless tobacco: Never   Tobacco comments:    smoking cessation materials not required  Vaping Use   Vaping status: Never Used  Substance and Sexual Activity   Alcohol use: No    Alcohol/week: 0.0 standard drinks of alcohol   Drug use: No   Sexual activity: Not Currently    Birth control/protection: Surgical  Other Topics Concern   Not on file  Social History Narrative   Pt lives alone   Social Drivers of Health   Financial Resource Strain: Low Risk  (05/12/2024)    Overall Financial Resource Strain (CARDIA)    Difficulty of Paying Living Expenses: Not hard at all  Food Insecurity: No Food Insecurity (05/12/2024)   Hunger Vital Sign    Worried About Running Out of Food in the Last Year: Never true    Ran Out of Food in the Last Year: Never true  Transportation Needs: No Transportation Needs (05/12/2024)   PRAPARE - Administrator, Civil Service (Medical): No    Lack of Transportation (Non-Medical): No  Physical Activity: Sufficiently Active (05/12/2024)   Exercise Vital Sign    Days of Exercise per Week: 6 days    Minutes of Exercise per Session: 40 min  Stress: No Stress Concern Present (05/12/2024)   [employer] of Occupational Health - Occupational Stress Questionnaire    Feeling of Stress: Not at all  Social Connections: Socially Isolated (05/12/2024)   Social Connection and Isolation Panel    Frequency of Communication with Friends and Family: More than three times a week    Frequency of Social Gatherings with Friends and Family: More than three times a week    Attends Religious Services: Never    Database administrator or Organizations: No    Attends Engineer, structural: Never    Marital Status: Never married    Tobacco Counseling Counseling given: Not Answered Tobacco comments: smoking cessation materials not required    Clinical Intake:  Pre-visit preparation completed: Yes  Pain : No/denies pain     BMI - recorded: 21.31 Nutritional Status: BMI of 19-24  Normal Nutritional Risks: None Diabetes: No  Lab Results  Component Value Date   HGBA1C 6.0 (H) 05/27/2023   HGBA1C 5.9 (H) 05/23/2022     How often do you need to have someone help you when you read instructions, pamphlets, or other written materials from your doctor or pharmacy?: 1 - Never  Interpreter Needed?: No  Information entered by :: Vina Ned, CMA   Activities of Daily Living     05/12/2024    8:08 AM  In your present state  of health, do you have any difficulty performing the following activities:  Hearing? 0  Vision? 0  Difficulty concentrating or making decisions? 0  Walking or climbing stairs? 0  Dressing or bathing? 0  Doing errands, shopping? 0  Preparing Food and eating ? N  Using the Toilet? N  In the past six months, have you accidently leaked urine? N  Do you have problems with loss of bowel control? N  Managing your  Medications? N  Managing your Finances? N  Housekeeping or managing your Housekeeping? N    Patient Care Team: Justus Leita DEL, MD as PCP - General (Internal Medicine) Myrna Adine Anes, MD as Consulting Physician (Ophthalmology) Cathlyn Seal, MD as Referring Physician (Dermatology) Leigh Sober, MD as Consulting Physician (Obstetrics)  I have updated your Care Teams any recent Medical Services you may have received from other providers in the past year.     Assessment:   This is a routine wellness examination for Valerie Raymond.  Hearing/Vision screen Hearing Screening - Comments:: Denies hearing loss  Vision Screening - Comments:: Gets routine eye exams, Dr. Adine Myrna, [place]   Goals Addressed             This Visit's Progress    Patient Stated       Maintain current health and activity     COMPLETED: Weight < 200 lb (90.7 kg)   132 lb (59.9 kg)      Depression Screen     05/12/2024    8:14 AM 05/27/2023    8:16 AM 05/07/2023    8:13 AM 05/23/2022    8:17 AM 04/30/2022    8:12 AM 10/01/2021    1:23 PM 05/15/2021    9:21 AM  PHQ 2/9 Scores  PHQ - 2 Score 0 0 0 0 0 0 0  PHQ- 9 Score 0 0 0 0  0 0    Fall Risk     05/12/2024    8:18 AM 05/27/2023    8:16 AM 05/07/2023    8:15 AM 05/23/2022    8:17 AM 05/03/2022    8:35 AM  Fall Risk   Falls in the past year? 0 0 0 0 0  Number falls in past yr: 0 0 0 0 0  Injury with Fall? 0 0 0 0 0  Risk for fall due to : No Fall Risks No Fall Risks No Fall Risks No Fall Risks No Fall Risks  Follow up Falls evaluation  completed Falls evaluation completed Falls prevention discussed;Falls evaluation completed Falls evaluation completed  Falls evaluation completed      Data saved with a previous flowsheet row definition    MEDICARE RISK AT HOME:  Medicare Risk at Home Any stairs in or around the home?: Yes If so, are there any without handrails?: No Home free of loose throw rugs in walkways, pet beds, electrical cords, etc?: Yes Adequate lighting in your home to reduce risk of falls?: Yes Life alert?: No Use of a cane, walker or w/c?: No Grab bars in the bathroom?: No Shower chair or bench in shower?: Yes Elevated toilet seat or a handicapped toilet?: Yes  TIMED UP AND GO:  Was the test performed?  No  Cognitive Function: 6CIT completed        05/12/2024    8:19 AM 05/07/2023    8:16 AM 04/30/2022    8:17 AM 03/29/2019    9:35 AM 03/23/2018    9:20 AM  6CIT Screen  What Year? 0 points 0 points 0 points 0 points 0 points  What month? 0 points 0 points 0 points 0 points 0 points  What time? 0 points 0 points 0 points 0 points 0 points  Count back from 20 0 points 0 points 0 points 0 points 0 points  Months in reverse 0 points 0 points 0 points 0 points 0 points  Repeat phrase 0 points 0 points 0 points 0 points 2 points  Total Score 0 points 0 points 0 points 0 points 2 points    Immunizations Immunization History  Administered Date(s) Administered   Fluad Quad(high Dose 65+) 05/20/2019, 06/07/2020, 05/23/2022   Fluad Trivalent(High Dose 65+) 05/27/2023   Influenza-Unspecified 05/23/2015, 06/01/2018, 06/25/2021   Moderna Covid-19 Vaccine Bivalent Booster 60yrs & up 06/04/2021   PFIZER(Purple Top)SARS-COV-2 Vaccination 11/11/2019, 12/07/2019, 06/19/2020, 02/13/2021   Pneumococcal Conjugate-13 03/17/2017   Pneumococcal Polysaccharide-23 03/23/2018   Tdap 03/13/2016   Zoster Recombinant(Shingrix) 08/31/2018, 11/18/2018   Zoster, Live 08/19/2011    Screening Tests Health Maintenance   Topic Date Due   COVID-19 Vaccine (6 - 2024-25 season) 05/18/2023   INFLUENZA VACCINE  04/16/2024   Colonoscopy  01/22/2025   MAMMOGRAM  04/21/2025   Medicare Annual Wellness (AWV)  05/12/2025   DTaP/Tdap/Td (2 - Td or Tdap) 03/13/2026   DEXA SCAN  06/14/2027   Pneumococcal Vaccine: 50+ Years  Completed   Hepatitis C Screening  Completed   Zoster Vaccines- Shingrix  Completed   HPV VACCINES  Aged Out   Meningococcal B Vaccine  Aged Out    Health Maintenance  Health Maintenance Due  Topic Date Due   COVID-19 Vaccine (6 - 2024-25 season) 05/18/2023   INFLUENZA VACCINE  04/16/2024   Health Maintenance Items Addressed: See Nurse Notes at the end of this note  Additional Screening:  Vision Screening: Recommended annual ophthalmology exams for early detection of glaucoma and other disorders of the eye. Would you like a referral to an eye doctor? No    Dental Screening: Recommended annual dental exams for proper oral hygiene  Community Resource Referral / Chronic Care Management: CRR required this visit?  No   CCM required this visit?  No   Plan:    I have personally reviewed and noted the following in the patient's chart:   Medical and social history Use of alcohol, tobacco or illicit drugs  Current medications and supplements including opioid prescriptions. Patient is not currently taking opioid prescriptions. Functional ability and status Nutritional status Physical activity Advanced directives List of other physicians Hospitalizations, surgeries, and ER visits in previous 12 months Vitals Screenings to include cognitive, depression, and falls Referrals and appointments  In addition, I have reviewed and discussed with patient certain preventive protocols, quality metrics, and best practice recommendations. A written personalized care plan for preventive services as well as general preventive health recommendations were provided to patient.   Vina Ned,  CMA   05/12/2024   After Visit Summary: (MyChart) Due to this being a telephonic visit, the after visit summary with patients personalized plan was offered to patient via MyChart   Notes:  Flu vaccine at next OV 05/27/24 Declined Covid vaccine

## 2024-05-14 ENCOUNTER — Other Ambulatory Visit: Payer: Self-pay | Admitting: Internal Medicine

## 2024-05-16 NOTE — Telephone Encounter (Signed)
 Requested Prescriptions  Pending Prescriptions Disp Refills   pantoprazole  (PROTONIX ) 40 MG tablet [Pharmacy Med Name: PANTOPRAZOLE  SODIUM 40MG  TABLET DR] 90 tablet 1    Sig: TAKE ONE (1) TABLET BY MOUTH DAILY IN THE MORNING.     Gastroenterology: Proton Pump Inhibitors Passed - 05/16/2024 11:03 PM      Passed - Valid encounter within last 12 months    Recent Outpatient Visits   None     Future Appointments             In 1 week Justus, Leita DEL, MD [facility] at [facility], [phone] [place]

## 2024-05-27 ENCOUNTER — Encounter: Payer: Self-pay | Admitting: Internal Medicine

## 2024-05-27 ENCOUNTER — Ambulatory Visit (INDEPENDENT_AMBULATORY_CARE_PROVIDER_SITE_OTHER): Payer: Self-pay | Admitting: Internal Medicine

## 2024-05-27 VITALS — BP 120/64 | HR 61 | Ht 66.0 in | Wt 135.0 lb

## 2024-05-27 DIAGNOSIS — R7303 Prediabetes: Secondary | ICD-10-CM | POA: Diagnosis not present

## 2024-05-27 DIAGNOSIS — Z23 Encounter for immunization: Secondary | ICD-10-CM | POA: Diagnosis not present

## 2024-05-27 DIAGNOSIS — E785 Hyperlipidemia, unspecified: Secondary | ICD-10-CM

## 2024-05-27 DIAGNOSIS — M818 Other osteoporosis without current pathological fracture: Secondary | ICD-10-CM | POA: Diagnosis not present

## 2024-05-27 DIAGNOSIS — K222 Esophageal obstruction: Secondary | ICD-10-CM

## 2024-05-27 DIAGNOSIS — Z0000 Encounter for general adult medical examination without abnormal findings: Secondary | ICD-10-CM

## 2024-05-27 MED ORDER — ATORVASTATIN CALCIUM 10 MG PO TABS: ORAL_TABLET | ORAL | 1 refills | Status: AC

## 2024-05-27 NOTE — Assessment & Plan Note (Signed)
 Reflux symptoms are controlled on pantoprazole . No recurrence of esophageal stricture symptoms Patient denies red flag symptoms - no melena, weight loss, dysphagia.

## 2024-05-27 NOTE — Assessment & Plan Note (Signed)
 Managed with diet only. Lab Results  Component Value Date   HGBA1C 6.0 (H) 05/27/2023

## 2024-05-27 NOTE — Progress Notes (Signed)
 Date:  05/27/2024   Name:  Valerie Raymond   DOB:  13-Jan-1951   MRN:  [id number]   Chief Complaint: Annual Exam Valerie Raymond is a 73 y.o. female who presents today for her Complete Annual Exam. She feels well. She reports exercising. She reports she is sleeping well. Breast complaints - none.  Health Maintenance  Topic Date Due   COVID-19 Vaccine (6 - 2025-26 season) 05/17/2024   Colon Cancer Screening  01/22/2025   Breast Cancer Screening  04/21/2025   Medicare Annual Wellness Visit  05/12/2025   DTaP/Tdap/Td vaccine (2 - Td or Tdap) 03/13/2026   DEXA scan (bone density measurement)  06/14/2027   Pneumococcal Vaccine for age over 12  Completed   Flu Shot  Completed   Hepatitis C Screening  Completed   Zoster (Shingles) Vaccine  Completed   HPV Vaccine  Aged Out   Meningitis B Vaccine  Aged Out    Hyperlipidemia Pertinent negatives include no chest pain, myalgias or shortness of breath.  Gastroesophageal Reflux She complains of heartburn. She reports no abdominal pain, no chest pain, no coughing or no wheezing. This is a recurrent problem. The problem occurs rarely. Pertinent negatives include no fatigue. She has tried a PPI for the symptoms.    Review of Systems  Constitutional:  Negative for fatigue and unexpected weight change.  HENT:  Negative for trouble swallowing.   Eyes:  Negative for visual disturbance.  Respiratory:  Negative for cough, chest tightness, shortness of breath and wheezing.   Cardiovascular:  Negative for chest pain, palpitations and leg swelling.  Gastrointestinal:  Positive for heartburn. Negative for abdominal pain, constipation and diarrhea.  Musculoskeletal:  Positive for arthralgias. Negative for myalgias.  Skin:  Negative for color change and rash.  Neurological:  Negative for dizziness, weakness, light-headedness and headaches.  Psychiatric/Behavioral:  Negative for dysphoric mood and sleep disturbance. The patient is not nervous/anxious.       Lab Results  Component Value Date   NA 143 05/27/2023   K 4.4 05/27/2023   CO2 23 05/27/2023   GLUCOSE 93 05/27/2023   BUN 11 05/27/2023   CREATININE 0.74 05/27/2023   CALCIUM  9.9 05/27/2023   EGFR 86 05/27/2023   GFRNONAA 84 09/11/2020   Lab Results  Component Value Date   CHOL 143 05/27/2023   HDL 67 05/27/2023   LDLCALC 65 05/27/2023   TRIG 52 05/27/2023   CHOLHDL 2.1 05/27/2023   Lab Results  Component Value Date   TSH 2.140 05/23/2022   Lab Results  Component Value Date   HGBA1C 6.0 (H) 05/27/2023   Lab Results  Component Value Date   WBC 5.0 05/27/2023   HGB 12.6 05/27/2023   HCT 39.6 05/27/2023   MCV 92 05/27/2023   PLT 259 05/27/2023   Lab Results  Component Value Date   ALT 16 05/27/2023   AST 28 05/27/2023   ALKPHOS 87 05/27/2023   BILITOT 0.4 05/27/2023   Lab Results  Component Value Date   25OHVITD2 <1.0 08/04/2019   25OHVITD3 39 08/04/2019   VD25OH 50.0 09/11/2020     Patient Active Problem List   Diagnosis Date Noted   Prediabetes 05/27/2024   Osteoarthritis of fingers of both hands 05/27/2023   Grade 1 Lumbar Retrolisthesis L3/L4 08/03/2019   DDD (degenerative disc disease), lumbosacral 08/03/2019   Lumbosacral intervertebral disc displacement (IVDD) 08/03/2019   Lumbar facet arthropathy (Multilevel) (Bilateral) 08/03/2019   Lumbosacral foraminal stenosis (Bilateral) (L5-S1) 08/03/2019  Lumbar lateral recess stenosis (Left) (L2-3) 08/03/2019   Hyperlipidemia, mild 05/22/2018   Stricture and stenosis of esophagus    Dysphagia 03/21/2017   Family history of breast cancer in first degree relative 01/02/2016   Status post total abdominal hysterectomy and bilateral salpingo-oophorectomy (TAH-BSO) 01/02/2016   Vaginal atrophy 01/02/2016   Incomplete bladder emptying 01/02/2016   Midline cystocele 01/02/2016   Colon, diverticulosis 07/23/2015   Arthritis of knee, degenerative 07/23/2015   Other osteoporosis without current  pathological fracture 07/23/2015   Avitaminosis D 07/23/2015    Allergies  Allergen Reactions   Lodine [Etodolac] Diarrhea and Nausea And Vomiting    Past Surgical History:  Procedure Laterality Date   ABDOMINAL HYSTERECTOMY  09/16/1990   CATARACT EXTRACTION W/PHACO Right 12/03/2021   Procedure: CATARACT EXTRACTION PHACO AND INTRAOCULAR LENS PLACEMENT (IOC) RIGHT 4.01 00:33.0;  Surgeon: Myrna Adine Anes, MD;  Location: [facility];  Service: Ophthalmology;  Laterality: Right;   CATARACT EXTRACTION W/PHACO Left 12/17/2021   Procedure: CATARACT EXTRACTION PHACO AND INTRAOCULAR LENS PLACEMENT (IOC) LEFT 2.03 00:31.7;  Surgeon: Myrna Adine Anes, MD;  Location: [facility];  Service: Ophthalmology;  Laterality: Left;   COLONOSCOPY  09/16/2004   COLONOSCOPY N/A 01/23/2015   Procedure: COLONOSCOPY;  Surgeon: Rogelia Copping, MD;  Location: [facility];  Service: Gastroenterology;  Laterality: N/A;  cecum- [id number]   ESOPHAGEAL DILATION  04/21/2017   Procedure: ESOPHAGEAL DILATION;  Surgeon: Copping Rogelia, MD;  Location: [facility];  Service: Endoscopy;;   ESOPHAGEAL DILATION  07/07/2017   Procedure: ESOPHAGEAL DILATION;  Surgeon: Copping Rogelia, MD;  Location: [facility];  Service: Endoscopy;;   ESOPHAGEAL DILATION N/A 07/28/2017   Procedure: ESOPHAGEAL DILATION;  Surgeon: Copping Rogelia, MD;  Location: [facility];  Service: Endoscopy;  Laterality: N/A;   ESOPHAGOGASTRODUODENOSCOPY N/A 04/21/2017   Procedure: ESOPHAGOGASTRODUODENOSCOPY (EGD);  Surgeon: Copping Rogelia, MD;  Location: [facility];  Service: Endoscopy;  Laterality: N/A;   ESOPHAGOGASTRODUODENOSCOPY (EGD) WITH PROPOFOL  N/A 07/07/2017   Procedure: ESOPHAGOGASTRODUODENOSCOPY (EGD) WITH PROPOFOL ;  Surgeon: Copping Rogelia, MD;  Location: [facility];  Service: Endoscopy;  Laterality: N/A;   ESOPHAGOGASTRODUODENOSCOPY (EGD) WITH PROPOFOL  N/A 07/28/2017   Procedure:  ESOPHAGOGASTRODUODENOSCOPY (EGD) WITH PROPOFOL ;  Surgeon: Copping Rogelia, MD;  Location: [facility];  Service: Endoscopy;  Laterality: N/A;   SALPINGOOPHORECTOMY Bilateral    TOTAL HIP ARTHROPLASTY Left 09/2020    Social History   Tobacco Use   Smoking status: Never    Passive exposure: Never   Smokeless tobacco: Never   Tobacco comments:    smoking cessation materials not required  Vaping Use   Vaping status: Never Used  Substance Use Topics   Alcohol use: No    Alcohol/week: 0.0 standard drinks of alcohol   Drug use: No     Medication list has been reviewed and updated.  Current Meds  Medication Sig   ARTIFICIAL TEARS 1 % ophthalmic solution Place 1 drop into both eyes daily as needed (Dry eye).    Calcium  Carbonate-Vit D-Min (CALCIUM  1200 PO) Take 1,200 mg by mouth daily.    Cholecalciferol (VITAMIN D3) 50 MCG (2000 UT) capsule Take 2,000 Units by mouth daily. am   co-enzyme Q-10 30 MG capsule Take 30 mg by mouth daily.   CRANBERRY PO Take by mouth.   estradiol  (ESTRACE  VAGINAL) 0.1 MG/GM vaginal cream Apply 1/2 gram (0.5 mg) vaginally twice a week.   meloxicam  (MOBIC ) 15 MG tablet TAKE 1 TABLET(15 MG) BY MOUTH DAILY AS NEEDED  FOR PAIN   mometasone (ELOCON) 0.1 % cream Apply 1 application  topically daily. (Patient taking differently: Apply 1 application  topically daily. PRN)   pantoprazole  (PROTONIX ) 40 MG tablet TAKE ONE (1) TABLET BY MOUTH DAILY IN THE MORNING.   [DISCONTINUED] atorvastatin  (LIPITOR) 10 MG tablet TAKE 1 TABLET(10 MG) BY MOUTH DAILY       05/27/2024    8:19 AM 05/27/2023    8:16 AM 05/23/2022    8:17 AM 10/01/2021    1:24 PM  GAD 7 : Generalized Anxiety Score  Nervous, Anxious, on Edge 0 0 0 0  Control/stop worrying 0 0 0 0  Worry too much - different things 0 0 0 0  Trouble relaxing 0 0 0 0  Restless 0 0 0 0  Easily annoyed or irritable 0 0 0 0  Afraid - awful might happen 0 0 0 0  Total GAD 7 Score 0 0 0 0  Anxiety Difficulty Not  difficult at all Not difficult at all Not difficult at all Not difficult at all       05/27/2024    8:18 AM 05/12/2024    8:14 AM 05/27/2023    8:16 AM  Depression screen PHQ 2/9  Decreased Interest 0 0 0  Down, Depressed, Hopeless 0 0 0  PHQ - 2 Score 0 0 0  Altered sleeping 0 0 0  Tired, decreased energy 0 0 0  Change in appetite 0 0 0  Feeling bad or failure about yourself  0 0 0  Trouble concentrating 0 0 0  Moving slowly or fidgety/restless 0 0 0  Suicidal thoughts 0 0 0  PHQ-9 Score 0 0 0  Difficult doing work/chores Not difficult at all Not difficult at all Not difficult at all    BP Readings from Last 3 Encounters:  05/27/24 120/64  05/06/24 (!) 154/87  05/27/23 124/70    Physical Exam Vitals and nursing note reviewed.  Constitutional:      General: She is not in acute distress.    Appearance: She is well-developed.  HENT:     Head: Normocephalic and atraumatic.     Right Ear: Tympanic membrane and ear canal normal.     Left Ear: Tympanic membrane and ear canal normal.     Nose:     Right Sinus: No maxillary sinus tenderness.     Left Sinus: No maxillary sinus tenderness.  Eyes:     General: No scleral icterus.       Right eye: No discharge.        Left eye: No discharge.     Conjunctiva/sclera: Conjunctivae normal.  Neck:     Thyroid : No thyromegaly.     Vascular: No carotid bruit.  Cardiovascular:     Rate and Rhythm: Normal rate and regular rhythm.     Pulses: Normal pulses.     Heart sounds: Normal heart sounds.  Pulmonary:     Effort: Pulmonary effort is normal. No respiratory distress.     Breath sounds: No wheezing.  Abdominal:     General: Bowel sounds are normal.     Palpations: Abdomen is soft.     Tenderness: There is no abdominal tenderness.  Musculoskeletal:     Cervical back: Normal range of motion. No erythema.     Right lower leg: No edema.     Left lower leg: No edema.  Lymphadenopathy:     Cervical: No cervical adenopathy.   Skin:    General: Skin  is warm and dry.     Findings: No rash.  Neurological:     Mental Status: She is alert and oriented to person, place, and time.     Cranial Nerves: No cranial nerve deficit.     Sensory: No sensory deficit.     Deep Tendon Reflexes: Reflexes are normal and symmetric.  Psychiatric:        Attention and Perception: Attention normal.        Mood and Affect: Mood normal.     Wt Readings from Last 3 Encounters:  05/27/24 135 lb (61.2 kg)  05/12/24 132 lb (59.9 kg)  05/06/24 134 lb 12.8 oz (61.1 kg)    BP 120/64   Pulse 61   Ht 5' 6 (1.676 m)   Wt 135 lb (61.2 kg)   SpO2 97%   BMI 21.79 kg/m   Assessment and Plan:  Problem List Items Addressed This Visit       Unprioritized   Other osteoporosis without current pathological fracture (Chronic)   DEXA 2023 showed mild worsening since 2016. She completed 5 yrs of Fosamax from 2017 to 2023 Continues on Calcium  and vitamin D       Stricture and stenosis of esophagus (Chronic)   Reflux symptoms are controlled on pantoprazole . No recurrence of esophageal stricture symptoms Patient denies red flag symptoms - no melena, weight loss, dysphagia.       Relevant Orders   CBC with Differential/Platelet   Hyperlipidemia, mild (Chronic)   LDL is  Lab Results  Component Value Date   LDLCALC 65 05/27/2023   Currently taking atorvastatin    No medication side effects or other concerns. Recommended LDL goal is < 100.       Relevant Medications   atorvastatin  (LIPITOR) 10 MG tablet   Other Relevant Orders   Lipid panel   TSH   Prediabetes   Managed with diet only. Lab Results  Component Value Date   HGBA1C 6.0 (H) 05/27/2023         Relevant Orders   Comprehensive metabolic panel with GFR   Hemoglobin A1c   Other Visit Diagnoses       Annual physical exam    -  Primary   Normal exam up to date on immunizations and screenings she will consider the Covid vaccine     Encounter for  immunization       Relevant Orders   Flu vaccine HIGH DOSE PF(Fluzone Trivalent) (Completed)       Return in about 1 year (around 05/27/2025) for [facility] and CPX  Dr. Lemon.    Leita HILARIO Adie, MD [facility] [place]

## 2024-05-27 NOTE — Assessment & Plan Note (Signed)
 DEXA 2023 showed mild worsening since 2016. She completed 5 yrs of Fosamax from 2017 to 2023 Continues on Calcium  and vitamin D 

## 2024-05-27 NOTE — Assessment & Plan Note (Signed)
 LDL is  Lab Results  Component Value Date   LDLCALC 65 05/27/2023   Currently taking atorvastatin    No medication side effects or other concerns. Recommended LDL goal is < 100.

## 2024-05-28 ENCOUNTER — Ambulatory Visit: Payer: Self-pay | Admitting: Internal Medicine

## 2024-05-28 LAB — CBC WITH DIFFERENTIAL/PLATELET
Basophils Absolute: 0.1 x10E3/uL (ref 0.0–0.2)
Basos: 1 %
EOS (ABSOLUTE): 0.1 x10E3/uL (ref 0.0–0.4)
Eos: 2 %
Hematocrit: 39.9 % (ref 34.0–46.6)
Hemoglobin: 13.3 g/dL (ref 11.1–15.9)
Immature Grans (Abs): 0 x10E3/uL (ref 0.0–0.1)
Immature Granulocytes: 0 %
Lymphocytes Absolute: 0.8 x10E3/uL (ref 0.7–3.1)
Lymphs: 14 %
MCH: 30.4 pg (ref 26.6–33.0)
MCHC: 33.3 g/dL (ref 31.5–35.7)
MCV: 91 fL (ref 79–97)
Monocytes Absolute: 0.5 x10E3/uL (ref 0.1–0.9)
Monocytes: 9 %
Neutrophils Absolute: 4.3 x10E3/uL (ref 1.4–7.0)
Neutrophils: 74 %
Platelets: 267 x10E3/uL (ref 150–450)
RBC: 4.38 x10E6/uL (ref 3.77–5.28)
RDW: 12.7 % (ref 11.7–15.4)
WBC: 5.7 x10E3/uL (ref 3.4–10.8)

## 2024-05-28 LAB — COMPREHENSIVE METABOLIC PANEL WITH GFR
ALT: 24 IU/L (ref 0–32)
AST: 32 IU/L (ref 0–40)
Albumin: 4.5 g/dL (ref 3.8–4.8)
Alkaline Phosphatase: 78 IU/L (ref 44–121)
BUN/Creatinine Ratio: 17 (ref 12–28)
BUN: 12 mg/dL (ref 8–27)
Bilirubin Total: 0.5 mg/dL (ref 0.0–1.2)
CO2: 23 mmol/L (ref 20–29)
Calcium: 9.9 mg/dL (ref 8.7–10.3)
Chloride: 102 mmol/L (ref 96–106)
Creatinine, Ser: 0.7 mg/dL (ref 0.57–1.00)
Globulin, Total: 2.6 g/dL (ref 1.5–4.5)
Glucose: 86 mg/dL (ref 70–99)
Potassium: 4.3 mmol/L (ref 3.5–5.2)
Sodium: 139 mmol/L (ref 134–144)
Total Protein: 7.1 g/dL (ref 6.0–8.5)
eGFR: 92 mL/min/1.73 (ref >59)

## 2024-05-28 LAB — LIPID PANEL
Chol/HDL Ratio: 2.3 ratio (ref 0.0–4.4)
Cholesterol, Total: 149 mg/dL (ref 100–199)
HDL: 64 mg/dL (ref >39)
LDL Chol Calc (NIH): 72 mg/dL (ref 0–99)
Triglycerides: 66 mg/dL (ref 0–149)
VLDL Cholesterol Cal: 13 mg/dL (ref 5–40)

## 2024-05-28 LAB — HEMOGLOBIN A1C
Est. average glucose Bld gHb Est-mCnc: 114 mg/dL
Hgb A1c MFr Bld: 5.6 % (ref 4.8–5.6)

## 2024-05-28 LAB — TSH: TSH: 1.89 u[IU]/mL (ref 0.450–4.500)

## 2024-07-02 DIAGNOSIS — M7061 Trochanteric bursitis, right hip: Secondary | ICD-10-CM | POA: Diagnosis not present

## 2024-07-02 DIAGNOSIS — M7062 Trochanteric bursitis, left hip: Secondary | ICD-10-CM | POA: Diagnosis not present

## 2024-07-02 DIAGNOSIS — Z96642 Presence of left artificial hip joint: Secondary | ICD-10-CM | POA: Diagnosis not present

## 2025-05-18 ENCOUNTER — Ambulatory Visit

## 2025-05-31 ENCOUNTER — Encounter: Admitting: Student
